# Patient Record
Sex: Male | Born: 1965 | Race: White | Hispanic: No | Marital: Married | Smoking: Never smoker
Health system: Southern US, Community
[De-identification: ages and names within clinical notes are randomized; demographics above are authoritative.]

## PROBLEM LIST (undated history)

## (undated) DIAGNOSIS — I2510 Atherosclerotic heart disease of native coronary artery without angina pectoris: Secondary | ICD-10-CM

## (undated) DIAGNOSIS — J383 Other diseases of vocal cords: Secondary | ICD-10-CM

## (undated) DIAGNOSIS — M189 Osteoarthritis of first carpometacarpal joint, unspecified: Secondary | ICD-10-CM

## (undated) DIAGNOSIS — M51369 Other intervertebral disc degeneration, lumbar region without mention of lumbar back pain or lower extremity pain: Secondary | ICD-10-CM

## (undated) DIAGNOSIS — I10 Essential (primary) hypertension: Secondary | ICD-10-CM

## (undated) DIAGNOSIS — E119 Type 2 diabetes mellitus without complications: Secondary | ICD-10-CM

## (undated) DIAGNOSIS — I071 Rheumatic tricuspid insufficiency: Secondary | ICD-10-CM

## (undated) DIAGNOSIS — I77810 Thoracic aortic ectasia: Secondary | ICD-10-CM

## (undated) DIAGNOSIS — H6992 Unspecified Eustachian tube disorder, left ear: Secondary | ICD-10-CM

## (undated) DIAGNOSIS — H6523 Chronic serous otitis media, bilateral: Secondary | ICD-10-CM

## (undated) DIAGNOSIS — E1165 Type 2 diabetes mellitus with hyperglycemia: Secondary | ICD-10-CM

## (undated) DIAGNOSIS — M353 Polymyalgia rheumatica: Secondary | ICD-10-CM

## (undated) DIAGNOSIS — F419 Anxiety disorder, unspecified: Secondary | ICD-10-CM

## (undated) DIAGNOSIS — L290 Pruritus ani: Secondary | ICD-10-CM

## (undated) DIAGNOSIS — J309 Allergic rhinitis, unspecified: Secondary | ICD-10-CM

## (undated) DIAGNOSIS — R490 Dysphonia: Secondary | ICD-10-CM

## (undated) DIAGNOSIS — T7840XA Allergy, unspecified, initial encounter: Secondary | ICD-10-CM

## (undated) DIAGNOSIS — E785 Hyperlipidemia, unspecified: Secondary | ICD-10-CM

## (undated) DIAGNOSIS — M5136 Other intervertebral disc degeneration, lumbar region: Secondary | ICD-10-CM

## (undated) DIAGNOSIS — M109 Gout, unspecified: Secondary | ICD-10-CM

## (undated) DIAGNOSIS — Z8659 Personal history of other mental and behavioral disorders: Secondary | ICD-10-CM

## (undated) DIAGNOSIS — I517 Cardiomegaly: Secondary | ICD-10-CM

## (undated) DIAGNOSIS — Z87442 Personal history of urinary calculi: Secondary | ICD-10-CM

## (undated) DIAGNOSIS — I209 Angina pectoris, unspecified: Secondary | ICD-10-CM

## (undated) DIAGNOSIS — R7989 Other specified abnormal findings of blood chemistry: Secondary | ICD-10-CM

## (undated) HISTORY — DX: Other intervertebral disc degeneration, lumbar region: M51.36

## (undated) HISTORY — DX: Other intervertebral disc degeneration, lumbar region without mention of lumbar back pain or lower extremity pain: M51.369

## (undated) HISTORY — DX: Gout, unspecified: M10.9

## (undated) HISTORY — DX: Allergy, unspecified, initial encounter: T78.40XA

## (undated) HISTORY — PX: OTHER SURGICAL HISTORY: SHX169

## (undated) HISTORY — DX: Allergic rhinitis, unspecified: J30.9

## (undated) HISTORY — DX: Type 2 diabetes mellitus with hyperglycemia: E11.65

## (undated) HISTORY — DX: Personal history of other mental and behavioral disorders: Z86.59

## (undated) HISTORY — DX: Pruritus ani: L29.0

---

## 1983-03-25 HISTORY — PX: KNEE SURGERY: SHX244

## 1992-03-24 DIAGNOSIS — I10 Essential (primary) hypertension: Secondary | ICD-10-CM

## 1992-03-24 HISTORY — DX: Essential (primary) hypertension: I10

## 1995-03-25 HISTORY — PX: BACK SURGERY: SHX140

## 1997-03-24 HISTORY — PX: ANKLE SURGERY: SHX546

## 1998-03-24 DIAGNOSIS — Z8659 Personal history of other mental and behavioral disorders: Secondary | ICD-10-CM

## 1998-03-24 HISTORY — DX: Personal history of other mental and behavioral disorders: Z86.59

## 1998-04-07 ENCOUNTER — Emergency Department (HOSPITAL_COMMUNITY): Admission: EM | Admit: 1998-04-07 | Discharge: 1998-04-07 | Payer: Self-pay | Admitting: Emergency Medicine

## 1998-04-10 ENCOUNTER — Ambulatory Visit (HOSPITAL_COMMUNITY): Admission: RE | Admit: 1998-04-10 | Discharge: 1998-04-10 | Payer: Self-pay | Admitting: Orthopedic Surgery

## 1998-04-10 ENCOUNTER — Encounter: Payer: Self-pay | Admitting: Orthopedic Surgery

## 2000-03-24 HISTORY — PX: SHOULDER SURGERY: SHX246

## 2002-05-17 ENCOUNTER — Emergency Department (HOSPITAL_COMMUNITY): Admission: EM | Admit: 2002-05-17 | Discharge: 2002-05-17 | Payer: Self-pay

## 2005-01-09 ENCOUNTER — Encounter: Admission: RE | Admit: 2005-01-09 | Discharge: 2005-01-09 | Payer: Self-pay | Admitting: Specialist

## 2005-07-23 ENCOUNTER — Encounter: Payer: Self-pay | Admitting: Emergency Medicine

## 2005-07-23 ENCOUNTER — Encounter: Payer: Self-pay | Admitting: Orthopedic Surgery

## 2010-03-24 DIAGNOSIS — E1165 Type 2 diabetes mellitus with hyperglycemia: Secondary | ICD-10-CM

## 2010-03-24 DIAGNOSIS — IMO0002 Reserved for concepts with insufficient information to code with codable children: Secondary | ICD-10-CM

## 2010-03-24 HISTORY — DX: Reserved for concepts with insufficient information to code with codable children: IMO0002

## 2010-03-24 HISTORY — DX: Type 2 diabetes mellitus with hyperglycemia: E11.65

## 2011-12-23 DIAGNOSIS — L290 Pruritus ani: Secondary | ICD-10-CM

## 2011-12-23 HISTORY — DX: Pruritus ani: L29.0

## 2012-12-12 ENCOUNTER — Emergency Department (INDEPENDENT_AMBULATORY_CARE_PROVIDER_SITE_OTHER)
Admission: EM | Admit: 2012-12-12 | Discharge: 2012-12-12 | Disposition: A | Discharge status: Home / Self Care | Payer: BC Managed Care – PPO | Source: Home / Self Care | Attending: Family Medicine | Admitting: Family Medicine

## 2012-12-12 ENCOUNTER — Encounter (HOSPITAL_COMMUNITY): Payer: Self-pay | Admitting: Emergency Medicine

## 2012-12-12 DIAGNOSIS — J019 Acute sinusitis, unspecified: Secondary | ICD-10-CM

## 2012-12-12 DIAGNOSIS — Z760 Encounter for issue of repeat prescription: Secondary | ICD-10-CM

## 2012-12-12 HISTORY — DX: Essential (primary) hypertension: I10

## 2012-12-12 HISTORY — DX: Type 2 diabetes mellitus without complications: E11.9

## 2012-12-12 LAB — POCT I-STAT, CHEM 8
BUN: 13 mg/dL (ref 6–23)
Calcium, Ion: 1.24 mmol/L — ABNORMAL HIGH (ref 1.12–1.23)
Chloride: 105 mEq/L (ref 96–112)
Creatinine, Ser: 0.9 mg/dL (ref 0.50–1.35)
Glucose, Bld: 106 mg/dL — ABNORMAL HIGH (ref 70–99)
HCT: 51 % (ref 39.0–52.0)
Hemoglobin: 17.3 g/dL — ABNORMAL HIGH (ref 13.0–17.0)
Potassium: 4.1 mEq/L (ref 3.5–5.1)
Sodium: 141 mEq/L (ref 135–145)
TCO2: 24 mmol/L (ref 0–100)

## 2012-12-12 MED ORDER — FLUTICASONE PROPIONATE 50 MCG/ACT NA SUSP: 2.0000 | Freq: Every day | NASAL | Status: DC

## 2012-12-12 MED ORDER — LISINOPRIL 20 MG PO TABS: 20.0000 mg | ORAL_TABLET | Freq: Two times a day (BID) | ORAL | Status: DC

## 2012-12-12 MED ORDER — AMOXICILLIN 500 MG PO CAPS: 500.0000 mg | ORAL_CAPSULE | Freq: Three times a day (TID) | ORAL | Status: DC

## 2012-12-12 MED ORDER — CETIRIZINE-PSEUDOEPHEDRINE ER 5-120 MG PO TB12: 1.0000 | ORAL_TABLET | Freq: Two times a day (BID) | ORAL | Status: DC

## 2012-12-12 MED ORDER — GUAIFENESIN-CODEINE 100-10 MG/5ML PO SOLN: 5.0000 mL | Freq: Three times a day (TID) | ORAL | Status: DC | PRN

## 2012-12-12 NOTE — ED Notes (Signed)
Pt c/o persistent dry cough with mild congestion and pressure in both ears. This morning pt states he could barely hear due to ringing in his ears. Pt has been taking OTC allergy meds and nyquil with mild relief. Pt is alert and oriented.

## 2012-12-12 NOTE — ED Provider Notes (Signed)
CSN: [id number]     Arrival date & time 12/12/12  0945 History   First MD Initiated Contact with Patient 12/12/12 1055     Chief Complaint  Patient presents with  . URI   (Consider location/radiation/quality/duration/timing/severity/associated sxs/prior Treatment) HPI Comments: 47 year old male with history of diabetes and hypertension. Here complaining of nasal congestion, dry nonproductive cough and bilateral ear pain for 4 days. Symptoms getting worse and pain in his left ear has been worse and also left side sinus pressure since last night. Has been taking over-the-counter allergy medications with minimal improvement. Denies chest pain, shortness of breath. The patient also would like a refill on his blood pressure medication and these have a new patient appointment with a primary care office in November.   Past Medical History  Diagnosis Date  . Diabetes mellitus without complication   . Hypertension    Past Surgical History  Procedure Laterality Date  . Shoulder surgery    . Back surgery    . Ankle surgery    . Knee surgery     History reviewed. No pertinent family history. History  Substance Use Topics  . Smoking status: Never Smoker   . Smokeless tobacco: Not on file  . Alcohol Use: Yes     Comment: occasonally    Review of Systems  Constitutional: Negative for fever, chills, diaphoresis, appetite change and fatigue.  HENT: Positive for ear pain, congestion and rhinorrhea. Negative for sore throat, trouble swallowing and ear discharge.   Eyes: Negative for discharge.  Respiratory: Positive for cough. Negative for shortness of breath and wheezing.   Cardiovascular: Negative for chest pain, palpitations and leg swelling.  Gastrointestinal: Negative for nausea, vomiting and abdominal pain.  Endocrine: Negative for polydipsia, polyphagia and polyuria.  Skin: Negative for rash.  Neurological: Positive for headaches. Negative for dizziness.  All other systems reviewed and  are negative.    Allergies  Review of patient's allergies indicates no known allergies.  Home Medications   Current Outpatient Rx  Name  Route  Sig  Dispense  Refill  . lisinopril (PRINIVIL,ZESTRIL) 20 MG tablet   Oral   Take 20 mg by mouth 2 (two) times daily.          BP 150/96  Pulse 69  Temp(Src) 97.9 F (36.6 C) (Oral)  Resp 20  SpO2 98% Physical Exam  Nursing note and vitals reviewed. Constitutional: He is oriented to person, place, and time. He appears well-developed and well-nourished. No distress.  HENT:  Head: Normocephalic and atraumatic.  Mouth/Throat: No oropharyngeal exudate.  Nasal Congestion with erythema and swelling of nasal turbinates, clear rhinorrhea. Mild pharyngeal erythema no exudates. No uvula deviation. No trismus. TM's with increased vascular markings and some dullness bilaterally no swelling or bulging  Eyes: Conjunctivae and EOM are normal. Pupils are equal, round, and reactive to light. Right eye exhibits no discharge. Left eye exhibits no discharge. No scleral icterus.  Neck: Neck supple. No JVD present. No thyromegaly present.  Cardiovascular: Normal rate, regular rhythm and normal heart sounds.  Exam reveals no gallop and no friction rub.   No murmur heard. No LEE  Pulmonary/Chest: Effort normal and breath sounds normal.  Neurological: He is alert and oriented to person, place, and time.  Skin: No rash noted. He is not diaphoretic.    ED Course  Procedures (including critical care time) Labs Review Labs Reviewed  POCT I-STAT, CHEM 8 - Abnormal; Notable for the following:    Glucose, Bld 106 (*)  Calcium, Ion 1.24 (*)    Hemoglobin 17.3 (*)    All other components within normal limits   Imaging Review No results found.  MDM   1. Acute rhinosinusitis   2. Medication refill    Prescribed cetirizine/pseudoephedrine, fluticasone, guaifenesin/codeine. Hold prescription for amoxicillin to start in 48 hours if not improving or  worsening symptoms. Refilled lisinopril.  Supportive care and red flags should prompt his return to medical attention discussed with patient and provided in writing.     Sharin Grave, MD 12/15/12 1317

## 2013-01-24 ENCOUNTER — Ambulatory Visit (INDEPENDENT_AMBULATORY_CARE_PROVIDER_SITE_OTHER): Payer: BC Managed Care – PPO | Admitting: Family Medicine

## 2013-01-24 ENCOUNTER — Encounter: Payer: Self-pay | Admitting: Family Medicine

## 2013-01-24 VITALS — BP 148/98 | HR 72 | Temp 98.7°F | Ht 69.5 in | Wt 279.5 lb

## 2013-01-24 DIAGNOSIS — M109 Gout, unspecified: Secondary | ICD-10-CM

## 2013-01-24 DIAGNOSIS — E119 Type 2 diabetes mellitus without complications: Secondary | ICD-10-CM

## 2013-01-24 DIAGNOSIS — I152 Hypertension secondary to endocrine disorders: Secondary | ICD-10-CM | POA: Insufficient documentation

## 2013-01-24 DIAGNOSIS — R195 Other fecal abnormalities: Secondary | ICD-10-CM | POA: Insufficient documentation

## 2013-01-24 DIAGNOSIS — E1140 Type 2 diabetes mellitus with diabetic neuropathy, unspecified: Secondary | ICD-10-CM | POA: Insufficient documentation

## 2013-01-24 DIAGNOSIS — M5137 Other intervertebral disc degeneration, lumbosacral region: Secondary | ICD-10-CM

## 2013-01-24 DIAGNOSIS — E1159 Type 2 diabetes mellitus with other circulatory complications: Secondary | ICD-10-CM | POA: Insufficient documentation

## 2013-01-24 DIAGNOSIS — L290 Pruritus ani: Secondary | ICD-10-CM

## 2013-01-24 DIAGNOSIS — J309 Allergic rhinitis, unspecified: Secondary | ICD-10-CM | POA: Insufficient documentation

## 2013-01-24 DIAGNOSIS — M5136 Other intervertebral disc degeneration, lumbar region: Secondary | ICD-10-CM | POA: Insufficient documentation

## 2013-01-24 DIAGNOSIS — I10 Essential (primary) hypertension: Secondary | ICD-10-CM

## 2013-01-24 MED ORDER — LISINOPRIL 20 MG PO TABS: 20.0000 mg | ORAL_TABLET | Freq: Two times a day (BID) | ORAL | Status: DC

## 2013-01-24 NOTE — Assessment & Plan Note (Signed)
Chronic - elevated today. Advised to monitor at home and notify me if persistently elevated.

## 2013-01-24 NOTE — Addendum Note (Signed)
Addended by: Eustaquio Boyden on: 01/24/2013 10:59 AM   Modules accepted: Orders

## 2013-01-24 NOTE — Assessment & Plan Note (Signed)
Uses flonase prn.  

## 2013-01-24 NOTE — Progress Notes (Addendum)
Subjective:    Patient ID: Joel Bennett, male    DOB: 16-Jul-1965, 47 y.o.   MRN: [id number]  HPI CC: new pt to establish  Pleasant 47 yo presents to establish care.  Prior saw Dr. Boneta Lucks in [place].  Recently moved from there.  Will send off form for records.  HTN - compliant with lisinopril 20mg  bid.  Checks at home.  No HA, vision changes, CP/tightness, SOB, leg swelling.  DM - dx 2 yrs ago.  Diet controlled.  Last A1c was 6ish.  Checks sugars intermittently.  Last eye exam >1 yr ago (11/2011).  Foot exam today.  Intermittent itch perinally for the last year.  Unsure if rash present with this.  Has tried preparation H which initially helped but no longer.  Becoming more constant.  Worse at night time - keeps him up.  No h/o hemorrhoids.  No bleeding with stooling.  Tends to wake him up nightly 1 hour after he falls asleep  Dizzy episode 1 wk ago.  Lasted a few hours.  Sleep improved sxs.  Described as lightheaded.  Sugar was 109.    Gout - on indocin prn.  No recent gout flare.  Preventative: last CPE 2012 Flu shot  Tetanus - ~2009  Medications and allergies reviewed and updated in chart.  Past histories reviewed and updated if relevant as below. There are no active problems to display for this patient.  Past Medical History  Diagnosis Date  . Diabetes mellitus without complication 2012  . Hypertension 1994  . DDD (degenerative disc disease), lumbar   . History of depression 2000    treated with meds  . Allergic rhinitis    Past Surgical History  Procedure Laterality Date  . Shoulder surgery Left 2002    x 2  . Back surgery  1997    Lumbar  . Ankle surgery Left 1999  . Knee surgery Right 1985   History  Substance Use Topics  . Smoking status: Former Smoker    Types: Cigarettes    Quit date: 03/24/1978  . Smokeless tobacco: Current User    Types: Snuff  . Alcohol Use: Yes     Comment: occasional   Family History  Problem Relation Age of Onset  . Cancer  Maternal Aunt     lung (smoker)  . Cancer Maternal Aunt     rectal  . Cancer Mother     lung (smoker)  . Cancer Paternal Grandmother     lung (smoker)  . Cancer Other     paternal great uncle unsure type  . Alcohol abuse Paternal Grandfather   . Alcohol abuse Maternal Grandfather   . Alcohol abuse Paternal Uncle     and drugs  . CAD Paternal Grandmother     MI  . CAD Paternal Uncle     MI  . CAD Paternal Aunt 20    MI  . Stroke Maternal Grandmother   . Stroke Cousin   . Diabetes Father   . Diabetes Cousin    No Known Allergies Current Outpatient Prescriptions on File Prior to Visit  Medication Sig Dispense Refill  . lisinopril (PRINIVIL,ZESTRIL) 20 MG tablet Take 1 tablet (20 mg total) by mouth 2 (two) times daily.  30 tablet  1  . fluticasone (FLONASE) 50 MCG/ACT nasal spray Place 2 sprays into the nose daily.  16 g  1   No current facility-administered medications on file prior to visit.     Review of Systems  Constitutional: Negative  for fever, chills, activity change, appetite change, fatigue and unexpected weight change.  HENT: Negative for hearing loss.   Eyes: Negative for visual disturbance.  Respiratory: Negative for cough, chest tightness, shortness of breath and wheezing.   Cardiovascular: Negative for chest pain, palpitations and leg swelling.  Gastrointestinal: Negative for nausea, vomiting, abdominal pain, diarrhea, constipation, blood in stool and abdominal distention.  Genitourinary: Negative for hematuria and difficulty urinating.  Musculoskeletal: Negative for arthralgias, myalgias and neck pain.  Skin: Negative for rash.  Neurological: Positive for dizziness. Negative for seizures, syncope and headaches.  Hematological: Negative for adenopathy. Does not bruise/bleed easily.  Psychiatric/Behavioral: Negative for dysphoric mood. The patient is not nervous/anxious.        Objective:   Physical Exam  Nursing note and vitals reviewed. Constitutional:  He is oriented to person, place, and time. He appears well-developed and well-nourished. No distress.  HENT:  Head: Normocephalic and atraumatic.  Right Ear: External ear normal.  Left Ear: External ear normal.  Nose: Nose normal.  Mouth/Throat: Oropharynx is clear and moist. No oropharyngeal exudate.  Eyes: Conjunctivae and EOM are normal. Pupils are equal, round, and reactive to light. No scleral icterus.  Neck: Normal range of motion. Neck supple. No thyromegaly present.  Cardiovascular: Normal rate, regular rhythm, normal heart sounds and intact distal pulses.   No murmur heard. Pulses:      Radial pulses are 2+ on the right side, and 2+ on the left side.  Pulmonary/Chest: Effort normal and breath sounds normal. No respiratory distress. He has no wheezes. He has no rales.  Abdominal: Soft. Bowel sounds are normal.  Genitourinary: Rectal exam shows no external hemorrhoid and no fissure.  Raw and irritated perianal skin. No obvious erythema or rash. Mildly tender anterior anal ridge  Musculoskeletal: Normal range of motion. He exhibits no edema.  Diabetic foot exam: Normal inspection No skin breakdown calluses R>L Normal DP/PT pulses Normal sensation to light touch and monofilament Nails normal  Lymphadenopathy:    He has no cervical adenopathy.  Neurological: He is alert and oriented to person, place, and time.  CN grossly intact, station and gait intact  Skin: Skin is warm and dry. No rash noted.  Psychiatric: He has a normal mood and affect. His behavior is normal. Judgment and thought content normal.       Assessment & Plan:

## 2013-01-24 NOTE — Assessment & Plan Note (Addendum)
Check A1c at next blood work. From report sounds diet controlled diabetes.

## 2013-01-24 NOTE — Assessment & Plan Note (Signed)
?  pinworm infection vs irritant dermatitis. Sent home with pinworm collection kit. Pt will return in the next week.

## 2013-01-24 NOTE — Assessment & Plan Note (Signed)
Continue indocin prn.  No recent gout flares.

## 2013-01-24 NOTE — Patient Instructions (Signed)
Monitor blood pressure at home and let me know if consistently elevated. I will await records from [place]. Return in next few weeks fasting at your conveniene and afterwards for physical. Return pinworm kit - if negative, I will prescribe topical medicine for itch.

## 2013-01-24 NOTE — Assessment & Plan Note (Signed)
Continue flexeril prn

## 2013-01-25 NOTE — Addendum Note (Signed)
Addended by: Alvina Chou on: 01/25/2013 02:16 PM   Modules accepted: Orders

## 2013-01-26 LAB — PINWORM PREP: RESULT - PINWORM: NONE SEEN

## 2013-01-27 ENCOUNTER — Other Ambulatory Visit: Payer: Self-pay | Admitting: Family Medicine

## 2013-01-27 ENCOUNTER — Other Ambulatory Visit: Payer: Self-pay

## 2013-01-27 MED ORDER — DESONIDE 0.05 % EX CREA: TOPICAL_CREAM | Freq: Every day | CUTANEOUS | Status: DC

## 2013-02-25 ENCOUNTER — Encounter: Payer: Self-pay | Admitting: Family Medicine

## 2013-02-25 ENCOUNTER — Ambulatory Visit (INDEPENDENT_AMBULATORY_CARE_PROVIDER_SITE_OTHER): Payer: BC Managed Care – PPO | Admitting: Family Medicine

## 2013-02-25 VITALS — BP 136/90 | HR 65 | Temp 98.2°F | Ht 70.5 in | Wt 277.0 lb

## 2013-02-25 DIAGNOSIS — R198 Other specified symptoms and signs involving the digestive system and abdomen: Secondary | ICD-10-CM

## 2013-02-25 DIAGNOSIS — E119 Type 2 diabetes mellitus without complications: Secondary | ICD-10-CM

## 2013-02-25 DIAGNOSIS — R194 Change in bowel habit: Secondary | ICD-10-CM

## 2013-02-25 DIAGNOSIS — M109 Gout, unspecified: Secondary | ICD-10-CM

## 2013-02-25 DIAGNOSIS — L290 Pruritus ani: Secondary | ICD-10-CM

## 2013-02-25 DIAGNOSIS — D751 Secondary polycythemia: Secondary | ICD-10-CM

## 2013-02-25 DIAGNOSIS — I10 Essential (primary) hypertension: Secondary | ICD-10-CM

## 2013-02-25 DIAGNOSIS — D45 Polycythemia vera: Secondary | ICD-10-CM

## 2013-02-25 DIAGNOSIS — Z0000 Encounter for general adult medical examination without abnormal findings: Secondary | ICD-10-CM | POA: Insufficient documentation

## 2013-02-25 DIAGNOSIS — Z0001 Encounter for general adult medical examination with abnormal findings: Secondary | ICD-10-CM | POA: Insufficient documentation

## 2013-02-25 LAB — LIPID PANEL
Cholesterol: 150 mg/dL (ref 0–200)
HDL: 30.4 mg/dL — ABNORMAL LOW (ref >39.00)
LDL Cholesterol: 100 mg/dL — ABNORMAL HIGH (ref 0–99)
Total CHOL/HDL Ratio: 5
Triglycerides: 99 mg/dL (ref 0.0–149.0)
VLDL: 19.8 mg/dL (ref 0.0–40.0)

## 2013-02-25 LAB — CBC WITH DIFFERENTIAL/PLATELET
Basophils Absolute: 0 10*3/uL (ref 0.0–0.1)
Basophils Relative: 0.5 % (ref 0.0–3.0)
Eosinophils Absolute: 0.2 10*3/uL (ref 0.0–0.7)
Eosinophils Relative: 2.7 % (ref 0.0–5.0)
HCT: 45.5 % (ref 39.0–52.0)
Hemoglobin: 15.1 g/dL (ref 13.0–17.0)
Lymphocytes Relative: 32.3 % (ref 12.0–46.0)
Lymphs Abs: 2.1 10*3/uL (ref 0.7–4.0)
MCHC: 33.2 g/dL (ref 30.0–36.0)
MCV: 95.6 fl (ref 78.0–100.0)
Monocytes Absolute: 0.6 10*3/uL (ref 0.1–1.0)
Monocytes Relative: 9 % (ref 3.0–12.0)
Neutro Abs: 3.6 10*3/uL (ref 1.4–7.7)
Neutrophils Relative %: 55.5 % (ref 43.0–77.0)
Platelets: 187 10*3/uL (ref 150.0–400.0)
RBC: 4.76 Mil/uL (ref 4.22–5.81)
RDW: 13.6 % (ref 11.5–14.6)
WBC: 6.5 10*3/uL (ref 4.5–10.5)

## 2013-02-25 LAB — COMPREHENSIVE METABOLIC PANEL
ALT: 26 U/L (ref 0–53)
AST: 24 U/L (ref 0–37)
Albumin: 3.9 g/dL (ref 3.5–5.2)
Alkaline Phosphatase: 63 U/L (ref 39–117)
BUN: 11 mg/dL (ref 6–23)
CO2: 28 mEq/L (ref 19–32)
Calcium: 9 mg/dL (ref 8.4–10.5)
Chloride: 104 mEq/L (ref 96–112)
Creatinine, Ser: 1 mg/dL (ref 0.4–1.5)
GFR: 84.4 mL/min (ref >60.00)
Glucose, Bld: 126 mg/dL — ABNORMAL HIGH (ref 70–99)
Potassium: 4 mEq/L (ref 3.5–5.1)
Sodium: 138 mEq/L (ref 135–145)
Total Bilirubin: 0.8 mg/dL (ref 0.3–1.2)
Total Protein: 7 g/dL (ref 6.0–8.3)

## 2013-02-25 LAB — MICROALBUMIN / CREATININE URINE RATIO
Creatinine,U: 75.3 mg/dL
Microalb Creat Ratio: 1.9 mg/g (ref 0.0–30.0)
Microalb, Ur: 1.4 mg/dL (ref 0.0–1.9)

## 2013-02-25 LAB — HEMOGLOBIN A1C: Hgb A1c MFr Bld: 7.4 % — ABNORMAL HIGH (ref 4.6–6.5)

## 2013-02-25 LAB — TSH: TSH: 1.01 u[IU]/mL (ref 0.35–5.50)

## 2013-02-25 LAB — URIC ACID: Uric Acid, Serum: 6.5 mg/dL (ref 4.0–7.8)

## 2013-02-25 NOTE — Assessment & Plan Note (Signed)
Check A1c today.

## 2013-02-25 NOTE — Assessment & Plan Note (Signed)
Has resolved along with anal irritation - however now endorsing increased stool leaking after Bms - will treat with increased fiber in diet (1 glass metamucil or benefiber) - if this does not help, recommended GI referral for further evaluation.

## 2013-02-25 NOTE — Progress Notes (Signed)
Subjective:    Patient ID: Joel Bennett, male    DOB: Jun 04, 1965, 47 y.o.   MRN: [id number]  HPI CC: CPE  Some anal leakage noted afte BMs - wonders if rash and itching started from this.  Desonide cream did help.  Off all creams for now.  Denies diarrhea, constipation.  Stools 3-4 times daily.  Endorses formed stools.  DM - diet controlled.  HTN - compliant with lisinopril. Gout - no recent flares Wt Readings from Last 3 Encounters:  02/25/13 277 lb (125.646 kg)  01/24/13 279 lb 8 oz (126.78 kg)   Body mass index is 39.17 kg/(m^2).  Lives with wife Grown children Occ: Librarian, academic Edu: HS Activity: no regular exercise Diet: good water, fruits/vegetables daily, red meat regularly.  Preventative:  Flu shot - declines Tetanus - ~2009 Pneumovax - declines Seat belt use discussed - does not use. Sunscreen use discussed - does not use.  Medications and allergies reviewed and updated in chart.  Past histories reviewed and updated if relevant as below. Patient Active Problem List   Diagnosis Date Noted  . Healthcare maintenance 02/25/2013  . Anal itching 01/24/2013  . Gout   . Allergic rhinitis   . DDD (degenerative disc disease), lumbar   . Hypertension   . Diabetes mellitus without complication    Past Medical History  Diagnosis Date  . Diabetes mellitus without complication 2012  . Hypertension 1994  . DDD (degenerative disc disease), lumbar   . History of depression 2000    treated with meds  . Allergic rhinitis   . Gout    Past Surgical History  Procedure Laterality Date  . Shoulder surgery Left 2002    x 2  . Back surgery  1997    Lumbar  . Ankle surgery Left 1999  . Knee surgery Right 1985   History  Substance Use Topics  . Smoking status: Former Smoker    Types: Cigarettes    Quit date: 03/24/1978  . Smokeless tobacco: Current User    Types: Snuff  . Alcohol Use: Yes     Comment: occasional   Family History  Problem Relation Age of  Onset  . Cancer Maternal Aunt     lung (smoker)  . Cancer Maternal Aunt     rectal  . Cancer Mother     lung (smoker)  . Cancer Paternal Grandmother     lung (smoker)  . Cancer Other     paternal great uncle unsure type  . Alcohol abuse Paternal Grandfather   . Alcohol abuse Maternal Grandfather   . Alcohol abuse Paternal Uncle     and drugs  . CAD Paternal Grandmother     MI  . CAD Paternal Uncle     MI  . CAD Paternal Aunt 19    MI  . Stroke Maternal Grandmother   . Stroke Cousin   . Diabetes Father   . Diabetes Cousin    No Known Allergies Current Outpatient Prescriptions on File Prior to Visit  Medication Sig Dispense Refill  . desonide (DESOWEN) 0.05 % cream Apply topically daily. Apply to AA for no more than 1 week  30 g  0  . fluticasone (FLONASE) 50 MCG/ACT nasal spray Place 2 sprays into the nose daily.  16 g  1  . indomethacin (INDOCIN) 50 MG capsule Take 50 mg by mouth 3 (three) times daily with meals.      Marland Kitchen lisinopril (PRINIVIL,ZESTRIL) 20 MG tablet Take 1 tablet (20  mg total) by mouth 2 (two) times daily.  180 tablet  3  . cyclobenzaprine (FLEXERIL) 10 MG tablet Take 10 mg by mouth 3 (three) times daily as needed for muscle spasms.       No current facility-administered medications on file prior to visit.     Review of Systems  Constitutional: Positive for unexpected weight change (increase). Negative for fever, chills, activity change, appetite change and fatigue.  HENT: Negative for hearing loss.   Eyes: Negative for visual disturbance.  Respiratory: Negative for cough, chest tightness, shortness of breath and wheezing.   Cardiovascular: Negative for chest pain, palpitations and leg swelling.  Gastrointestinal: Negative for nausea, vomiting, abdominal pain, diarrhea, constipation, blood in stool and abdominal distention.  Genitourinary: Negative for hematuria and difficulty urinating.  Musculoskeletal: Negative for arthralgias, myalgias and neck pain.    Skin: Negative for rash.  Neurological: Positive for headaches (?allergy related). Negative for dizziness, seizures and syncope.  Hematological: Negative for adenopathy. Bruises/bleeds easily.  Psychiatric/Behavioral: Negative for dysphoric mood. The patient is not nervous/anxious.        Objective:   Physical Exam  Nursing note and vitals reviewed. Constitutional: He is oriented to person, place, and time. He appears well-developed and well-nourished. No distress.  HENT:  Head: Normocephalic and atraumatic.  Right Ear: External ear normal.  Left Ear: External ear normal.  Nose: Nose normal.  Mouth/Throat: Oropharynx is clear and moist. No oropharyngeal exudate.  Eyes: Conjunctivae and EOM are normal. Pupils are equal, round, and reactive to light. No scleral icterus.  Neck: Normal range of motion. Neck supple. No thyromegaly present.  Cardiovascular: Normal rate, regular rhythm, normal heart sounds and intact distal pulses.   No murmur heard. Pulses:      Radial pulses are 2+ on the right side, and 2+ on the left side.  Pulmonary/Chest: Effort normal and breath sounds normal. No respiratory distress. He has no wheezes. He has no rales.  Abdominal: Soft. Bowel sounds are normal. He exhibits no distension and no mass. There is no tenderness. There is no rebound and no guarding.  Genitourinary: Rectum normal and prostate normal. Rectal exam shows no external hemorrhoid, no internal hemorrhoid, no fissure, no mass, no tenderness and anal tone normal. Guaiac negative stool. Prostate is not enlarged (20gm) and not tender.  Musculoskeletal: Normal range of motion. He exhibits no edema.  Lymphadenopathy:    He has no cervical adenopathy.  Neurological: He is alert and oriented to person, place, and time.  CN grossly intact, station and gait intact  Skin: Skin is warm and dry. No rash noted.  Psychiatric: He has a normal mood and affect. His behavior is normal. Judgment and thought content  normal.       Assessment & Plan:

## 2013-02-25 NOTE — Assessment & Plan Note (Signed)
Stable, continue lisinopril 

## 2013-02-25 NOTE — Patient Instructions (Signed)
For stool issue - let's try soluble fiber supplement daily (benefiber or metamucil 1 glass daily) for 1-2 weeks.  If no noted improvement, let me know for referral to GI. Blood work today. I think you are doing well. Return as needed or in 1 year for next physical

## 2013-02-25 NOTE — Assessment & Plan Note (Signed)
Check uric acid today 

## 2013-02-25 NOTE — Assessment & Plan Note (Signed)
Preventative protocols reviewed and updated unless pt declined. Discussed healthy diet and lifestyle.  Declines all immunizations. 

## 2013-02-27 LAB — HEMOCCULT GUIAC POC 1CARD (OFFICE): Fecal Occult Blood, POC: NEGATIVE

## 2013-03-14 ENCOUNTER — Ambulatory Visit (INDEPENDENT_AMBULATORY_CARE_PROVIDER_SITE_OTHER): Payer: BC Managed Care – PPO | Admitting: Family Medicine

## 2013-03-14 ENCOUNTER — Encounter: Payer: Self-pay | Admitting: Family Medicine

## 2013-03-14 VITALS — BP 130/76 | HR 83 | Temp 98.3°F | Wt 280.5 lb

## 2013-03-14 DIAGNOSIS — J0100 Acute maxillary sinusitis, unspecified: Secondary | ICD-10-CM | POA: Insufficient documentation

## 2013-03-14 DIAGNOSIS — J019 Acute sinusitis, unspecified: Secondary | ICD-10-CM | POA: Insufficient documentation

## 2013-03-14 MED ORDER — AMOXICILLIN-POT CLAVULANATE 875-125 MG PO TABS: 1.0000 | ORAL_TABLET | Freq: Two times a day (BID) | ORAL | Status: DC

## 2013-03-14 NOTE — Progress Notes (Signed)
Pre-visit discussion using our clinic review tool. No additional management support is needed unless otherwise documented below in the visit note.  Sx started about 2 weeks ago.  Not getting better.  No ear pain but stuffy.  Rhinorrhea.  Stuffy nose.  No ST.  Not much post nasal gtt.  Cough is minimal, not much sputum, from clearing throat.  No fevers.  No aches, sweats, vomiting. No diarrhea.  No rash.   Meds, vitals, and allergies reviewed.   ROS: See HPI.  Otherwise, noncontributory.  GEN: nad, alert and oriented HEENT: mucous membranes moist, tm w/o erythema, nasal exam w/o erythema, clear discharge noted,  OP with cobblestoning, L max sinus ttp NECK: supple w/o LA CV: rrr.   PULM: ctab, no inc wob EXT: no edema SKIN: no acute rash

## 2013-03-14 NOTE — Assessment & Plan Note (Signed)
With exam and hx, would treat.  D/w pt. He agrees. Nontoxic. See instructions.

## 2013-03-14 NOTE — Patient Instructions (Signed)
Drink plenty of fluids, take tylenol as needed, and gargle with warm salt water for your throat.  Start the antibiotics today.  This should gradually improve.  Take care.  Let us know if you have other concerns.    

## 2013-03-16 ENCOUNTER — Ambulatory Visit: Payer: BC Managed Care – PPO | Admitting: Family Medicine

## 2013-03-28 ENCOUNTER — Encounter: Payer: Self-pay | Admitting: Family Medicine

## 2013-12-14 ENCOUNTER — Encounter: Payer: Self-pay | Admitting: Family Medicine

## 2013-12-14 ENCOUNTER — Ambulatory Visit (INDEPENDENT_AMBULATORY_CARE_PROVIDER_SITE_OTHER): Payer: BC Managed Care – PPO | Admitting: Family Medicine

## 2013-12-14 VITALS — BP 124/84 | HR 66 | Temp 98.2°F | Wt 270.5 lb

## 2013-12-14 DIAGNOSIS — J069 Acute upper respiratory infection, unspecified: Secondary | ICD-10-CM

## 2013-12-14 MED ORDER — AMOXICILLIN-POT CLAVULANATE 875-125 MG PO TABS: 1.0000 | ORAL_TABLET | Freq: Two times a day (BID) | ORAL | Status: DC

## 2013-12-14 NOTE — Progress Notes (Signed)
Pre visit review using our clinic review tool, if applicable. No additional management support is needed unless otherwise documented below in the visit note.  Sx started >1 week ago, started with nasal congestion.  "I fell awful."  Chills. No vomiting, no diarrhea. No known fevers.  L sided ear "tickles, like something is in there."  Still stuffy, worse in Am.  Some rhinorrhea.  Cough, with some chest congestion. Still fatigued.  Some day are better than others, some parts of the days are better than others.   Meds, vitals, and allergies reviewed.   ROS: See HPI.  Otherwise, noncontributory.  GEN: nad, alert and oriented HEENT: mucous membranes moist, tm w/o erythema, nasal exam w/o erythema, clear discharge noted,  OP with cobblestoning, sinuses not ttp NECK: supple w/o LA CV: rrr.   PULM: ctab, no inc wob EXT: no edema SKIN: no acute rash

## 2013-12-14 NOTE — Patient Instructions (Signed)
Use nasal saline a few times a day along with claritin D.  If not better in a few days, then start the antibiotics.   Take care. Glad to see you.

## 2013-12-15 DIAGNOSIS — J069 Acute upper respiratory infection, unspecified: Secondary | ICD-10-CM | POA: Insufficient documentation

## 2013-12-15 NOTE — Assessment & Plan Note (Signed)
Likely viral, d/w pt.  Use nasal saline a few times a day along with claritin D.  If not better in a few days, then start the augmentin. F/u prn.  He agrees.

## 2014-01-02 ENCOUNTER — Encounter: Payer: Self-pay | Admitting: Family Medicine

## 2014-01-03 MED ORDER — INDOMETHACIN 50 MG PO CAPS: 50.0000 mg | ORAL_CAPSULE | Freq: Three times a day (TID) | ORAL | Status: DC

## 2014-01-06 ENCOUNTER — Other Ambulatory Visit: Payer: Self-pay

## 2014-01-17 ENCOUNTER — Other Ambulatory Visit: Payer: Self-pay | Admitting: Family Medicine

## 2014-01-23 ENCOUNTER — Other Ambulatory Visit: Payer: Self-pay | Admitting: Family Medicine

## 2014-01-23 ENCOUNTER — Encounter: Payer: Self-pay | Admitting: Family Medicine

## 2014-01-23 ENCOUNTER — Ambulatory Visit (INDEPENDENT_AMBULATORY_CARE_PROVIDER_SITE_OTHER)
Admission: RE | Admit: 2014-01-23 | Discharge: 2014-01-23 | Disposition: A | Discharge status: Home / Self Care | Payer: BC Managed Care – PPO | Source: Ambulatory Visit | Attending: Family Medicine | Admitting: Family Medicine

## 2014-01-23 ENCOUNTER — Ambulatory Visit (INDEPENDENT_AMBULATORY_CARE_PROVIDER_SITE_OTHER): Payer: BC Managed Care – PPO | Admitting: Family Medicine

## 2014-01-23 VITALS — BP 130/80 | HR 72 | Temp 98.1°F | Wt 273.5 lb

## 2014-01-23 DIAGNOSIS — M189 Osteoarthritis of first carpometacarpal joint, unspecified: Secondary | ICD-10-CM | POA: Insufficient documentation

## 2014-01-23 DIAGNOSIS — G8929 Other chronic pain: Secondary | ICD-10-CM

## 2014-01-23 DIAGNOSIS — H9202 Otalgia, left ear: Secondary | ICD-10-CM

## 2014-01-23 DIAGNOSIS — H938X2 Other specified disorders of left ear: Secondary | ICD-10-CM | POA: Insufficient documentation

## 2014-01-23 DIAGNOSIS — M1812 Unilateral primary osteoarthritis of first carpometacarpal joint, left hand: Secondary | ICD-10-CM

## 2014-01-23 DIAGNOSIS — M25532 Pain in left wrist: Secondary | ICD-10-CM

## 2014-01-23 MED ORDER — HYDROCORTISONE-ACETIC ACID 1-2 % OT SOLN: 3.0000 [drp] | Freq: Three times a day (TID) | OTIC | Status: DC

## 2014-01-23 NOTE — Assessment & Plan Note (Addendum)
Not consistent with TMJ, TMs clear bilaterally, exam WNL, no LAD. Unclear etiology of ear pain. ?external canal irritation - sent in vosol hc drops. Discussed if not better, may send him to ENT for eval. Pt agrees with plan.

## 2014-01-23 NOTE — Patient Instructions (Addendum)
For L wrist - I think you have [place] arthritis. Xray today. If arthritis present we may refer you to hand surgeon to discuss further treatment options. In the interim, try vitamin D and glucosamine supplement for joint health. For L ear - irrigation performed today on right side to compare ear drums. Looking about the same. Try ear drops sent to pharmacy today for possible irritation of canal. Return in 1 month fasting for blood work and physical. Good to see you today, call us with questions.

## 2014-01-23 NOTE — Assessment & Plan Note (Addendum)
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 Will refer to hand surgery for further evaluation.

## 2014-01-23 NOTE — Progress Notes (Signed)
Pre visit review using our clinic review tool, if applicable. No additional management support is needed unless otherwise documented below in the visit note. 

## 2014-01-23 NOTE — Progress Notes (Signed)
BP 130/80 mmHg  Pulse 72  Temp(Src) 98.1 F (36.7 C) (Oral)  Wt 273 lb 8 oz (124.059 kg)   CC: hand and ear pain  Subjective:    Patient ID: Joel Bennett, male    DOB: 1966-02-07, 48 y.o.   MRN: [id number]  HPI: Joel Bennett is a 48 y.o. male presenting on 01/23/2014 for Hand Pain and Otalgia   Patient with gout and hypertension. Pt dips.  L hand pain ongoing for months - points to 1st [facility] joint. Denies inciting trauma/injury to hand. So far has tried indocin and ibuprofen without much improvement. Stays swollen. No current redness or warmth. Feels different from gout. No other joint pains. H/o L thumb injury 4 yrs ago - caught between steel rods, did not seek care for this.  Left ear pain - ongoing for last year. Intermittently improves some but never fully resolved. Denies hearing changes, tinnitus, URI sxs. Mild congestion last few days. Points inside ear as site of pain, with radiation to L side of face. Pain described as dull ache with "something crawling inside". No fevers/chills recently.  Lab Results  Component Value Date   HGBA1C 7.4* 02/25/2013  due for DM check.   Relevant past medical, surgical, family and social history reviewed and updated as indicated.  Allergies and medications reviewed and updated. Current Outpatient Prescriptions on File Prior to Visit  Medication Sig  . indomethacin (INDOCIN) 50 MG capsule Take 1 capsule (50 mg total) by mouth 3 (three) times daily with meals.  Marland Kitchen lisinopril (PRINIVIL,ZESTRIL) 20 MG tablet Take one tablet twice a day **WILL NEED PHYSICAL FOR FURTHER REFILLS**   No current facility-administered medications on file prior to visit.    Review of Systems Per HPI unless specifically indicated above    Objective:    BP 130/80 mmHg  Pulse 72  Temp(Src) 98.1 F (36.7 C) (Oral)  Wt 273 lb 8 oz (124.059 kg)  Physical Exam  Constitutional: He appears well-developed and well-nourished. No distress.  HENT:  Head:  Normocephalic and atraumatic.  Right Ear: Hearing and external ear normal.  Left Ear: Hearing, external ear and ear canal normal.  Nose: Nose normal. No mucosal edema or rhinorrhea.  Mouth/Throat: Uvula is midline, oropharynx is clear and moist and mucous membranes are normal. No oropharyngeal exudate, posterior oropharyngeal edema, posterior oropharyngeal erythema or tonsillar abscesses.  R ear with some wax in canal - unable to visualize TM so irrigation performed. L TM retracted but pearly grey  Eyes: Conjunctivae and EOM are normal. Pupils are equal, round, and reactive to light. No scleral icterus.  Neck: Normal range of motion. Neck supple.  Musculoskeletal: He exhibits no edema.  No pain at anatomical snuff box. Tender to palpation L 1st [facility] joint. FROM at thumb and wrists No active synovitis.  Lymphadenopathy:       Head (right side): No submental, no submandibular, no tonsillar, no preauricular and no posterior auricular adenopathy present.       Head (left side): No submental, no submandibular, no tonsillar, no preauricular and no posterior auricular adenopathy present.    He has no cervical adenopathy.  Skin: Skin is warm and dry. No rash noted.  Nursing note and vitals reviewed.      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 Will refer to hand surgery for further evaluation.    Relevant Orders      DG Wrist Complete Left (Completed)   Chronic left ear pain    Not consistent with TMJ, TMs clear bilaterally, exam WNL, no LAD. Unclear etiology of ear pain. ?external canal irritation - sent in vosol hc drops. Discussed if not better, may send him to ENT for eval. Pt agrees with plan.        Follow up plan: Return in about 1 month (around 02/22/2014), or as needed, for  annual exam, prior fasting for blood work.

## 2014-02-18 ENCOUNTER — Other Ambulatory Visit: Payer: Self-pay | Admitting: Family Medicine

## 2014-02-18 DIAGNOSIS — M109 Gout, unspecified: Secondary | ICD-10-CM

## 2014-02-18 DIAGNOSIS — I10 Essential (primary) hypertension: Secondary | ICD-10-CM

## 2014-02-18 DIAGNOSIS — E119 Type 2 diabetes mellitus without complications: Secondary | ICD-10-CM

## 2014-02-20 ENCOUNTER — Other Ambulatory Visit (INDEPENDENT_AMBULATORY_CARE_PROVIDER_SITE_OTHER): Payer: BC Managed Care – PPO

## 2014-02-20 DIAGNOSIS — I10 Essential (primary) hypertension: Secondary | ICD-10-CM

## 2014-02-20 DIAGNOSIS — E119 Type 2 diabetes mellitus without complications: Secondary | ICD-10-CM

## 2014-02-20 DIAGNOSIS — M109 Gout, unspecified: Secondary | ICD-10-CM

## 2014-02-20 LAB — MICROALBUMIN / CREATININE URINE RATIO
Creatinine,U: 113.1 mg/dL
Microalb Creat Ratio: 0.7 mg/g (ref 0.0–30.0)
Microalb, Ur: 0.8 mg/dL (ref 0.0–1.9)

## 2014-02-20 LAB — URIC ACID: Uric Acid, Serum: 7.7 mg/dL (ref 4.0–7.8)

## 2014-02-20 LAB — BASIC METABOLIC PANEL
BUN: 16 mg/dL (ref 6–23)
CO2: 27 mEq/L (ref 19–32)
Calcium: 8.9 mg/dL (ref 8.4–10.5)
Chloride: 101 mEq/L (ref 96–112)
Creatinine, Ser: 0.9 mg/dL (ref 0.4–1.5)
GFR: 97.43 mL/min (ref >60.00)
Glucose, Bld: 123 mg/dL — ABNORMAL HIGH (ref 70–99)
Potassium: 4.2 mEq/L (ref 3.5–5.1)
Sodium: 136 mEq/L (ref 135–145)

## 2014-02-20 LAB — LIPID PANEL
Cholesterol: 167 mg/dL (ref 0–200)
HDL: 31.1 mg/dL — ABNORMAL LOW (ref >39.00)
LDL Cholesterol: 98 mg/dL (ref 0–99)
NonHDL: 135.9
Total CHOL/HDL Ratio: 5
Triglycerides: 189 mg/dL — ABNORMAL HIGH (ref 0.0–149.0)
VLDL: 37.8 mg/dL (ref 0.0–40.0)

## 2014-02-20 LAB — HEMOGLOBIN A1C: Hgb A1c MFr Bld: 7.7 % — ABNORMAL HIGH (ref 4.6–6.5)

## 2014-02-22 ENCOUNTER — Other Ambulatory Visit: Payer: Self-pay | Admitting: *Deleted

## 2014-02-22 ENCOUNTER — Encounter: Payer: Self-pay | Admitting: Family Medicine

## 2014-02-22 MED ORDER — GLUCOSE BLOOD VI STRP: ORAL_STRIP | Status: DC

## 2014-02-22 NOTE — Telephone Encounter (Signed)
Ok to do. Thanks Check sugars 1-2 times daily prn. ICD10 = E11.65 Lab Results  Component Value Date   HGBA1C 7.7* 02/20/2014

## 2014-02-23 ENCOUNTER — Telehealth: Payer: Self-pay | Admitting: *Deleted

## 2014-02-27 ENCOUNTER — Ambulatory Visit (INDEPENDENT_AMBULATORY_CARE_PROVIDER_SITE_OTHER): Payer: BC Managed Care – PPO | Admitting: Family Medicine

## 2014-02-27 ENCOUNTER — Encounter: Payer: Self-pay | Admitting: Family Medicine

## 2014-02-27 VITALS — BP 136/86 | HR 84 | Temp 98.0°F | Ht 70.5 in | Wt 277.5 lb

## 2014-02-27 DIAGNOSIS — M109 Gout, unspecified: Secondary | ICD-10-CM

## 2014-02-27 DIAGNOSIS — Z0000 Encounter for general adult medical examination without abnormal findings: Secondary | ICD-10-CM

## 2014-02-27 DIAGNOSIS — I10 Essential (primary) hypertension: Secondary | ICD-10-CM

## 2014-02-27 DIAGNOSIS — IMO0001 Reserved for inherently not codable concepts without codable children: Secondary | ICD-10-CM

## 2014-02-27 DIAGNOSIS — E1165 Type 2 diabetes mellitus with hyperglycemia: Secondary | ICD-10-CM

## 2014-02-27 DIAGNOSIS — Z1211 Encounter for screening for malignant neoplasm of colon: Secondary | ICD-10-CM

## 2014-02-27 MED ORDER — METFORMIN HCL 500 MG PO TABS
500.0000 mg | ORAL_TABLET | Freq: Two times a day (BID) | ORAL | Status: DC
Start: 2014-02-27 — End: 2015-03-12

## 2014-02-27 NOTE — Assessment & Plan Note (Signed)
A1c deteriorated - will start metformin. Recheck 6 mo for f/u.

## 2014-02-27 NOTE — Patient Instructions (Addendum)
Let's start metformin 500mg  once daily for 5 days then increase to 500mg  twice dialy. This has been sent to pharmacy. Better sugar should help triglyceride levels. We will refer you for colonsocopy - expect a call in next few weeks. Return as needed or in 6 months for follow up, prior for labs.

## 2014-02-27 NOTE — Assessment & Plan Note (Signed)
Preventative protocols reviewed and updated unless pt declined. Discussed healthy diet and lifestyle.  Declines immunizations

## 2014-02-27 NOTE — Assessment & Plan Note (Signed)
Very infrequent flares - no need for daily gout lowering medication.

## 2014-02-27 NOTE — Assessment & Plan Note (Signed)
Stable on lisinopril. 

## 2014-02-27 NOTE — Progress Notes (Signed)
Pre visit review using our clinic review tool, if applicable. No additional management support is needed unless otherwise documented below in the visit note. 
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
## 2014-02-28 ENCOUNTER — Telehealth: Payer: Self-pay | Admitting: Family Medicine

## 2014-02-28 NOTE — Telephone Encounter (Signed)
emmi emailed °

## 2014-03-01 NOTE — Telephone Encounter (Signed)
error 

## 2014-03-14 ENCOUNTER — Encounter: Payer: Self-pay | Admitting: Gastroenterology

## 2014-04-10 ENCOUNTER — Encounter: Payer: Self-pay | Admitting: Family Medicine

## 2014-04-10 ENCOUNTER — Ambulatory Visit (INDEPENDENT_AMBULATORY_CARE_PROVIDER_SITE_OTHER): Payer: BLUE CROSS/BLUE SHIELD | Admitting: Family Medicine

## 2014-04-10 VITALS — BP 124/82 | HR 62 | Temp 98.0°F | Wt 276.8 lb

## 2014-04-10 DIAGNOSIS — J069 Acute upper respiratory infection, unspecified: Secondary | ICD-10-CM | POA: Insufficient documentation

## 2014-04-10 NOTE — Patient Instructions (Addendum)
You have a sinus infection, likely viral as early on. Take medicine as prescribed: ibuprofen short course 400-600mg  three times daily with food Push fluids and plenty of rest. Nasal saline irrigation or neti pot to help drain sinuses. May use plain mucinex with plenty of fluid to help mobilize mucous. Please let us know if fever >101.5, trouble opening/closing mouth, difficulty swallowing, or worsening instead of improving as expected.

## 2014-04-10 NOTE — Progress Notes (Signed)
Pre visit review using our clinic review tool, if applicable. No additional management support is needed unless otherwise documented below in the visit note. 

## 2014-04-10 NOTE — Assessment & Plan Note (Signed)
Anticipate viral URI given short duration and endorsed improvement noted. Supportive care as per instructions. Discussed warning signs to consider abx course. Update if not improving as expected.

## 2014-04-10 NOTE — Progress Notes (Signed)
   BP 124/82 mmHg  Pulse 62  Temp(Src) 98 F (36.7 C) (Oral)  Wt 276 lb 12 oz (125.533 kg)  SpO2 97%   CC: ?sinusitis  Subjective:    Patient ID: Joel Bennett, male    DOB: May 28, 1965, 49 y.o.   MRN: [id number]  HPI: [name] is a 49 y.o. male presenting on 04/10/2014 for Sinusitis   5d h/o stuffy nose, headache described as facial pressure pain, trouble breathing, ST, muffled hearing with clogged ears,   No fevers/chills, tooth pain, dyspnea or wheezing. No cough. No PNDrainage.  H/o allergic rhinitis. Ex smoker Has tried nyquil, OTC remedies to no avail. + sick contact is wife  Relevant past medical, surgical, family and social history reviewed and updated as indicated. Interim medical history since our last visit reviewed. Allergies and medications reviewed and updated. Current Outpatient Prescriptions on File Prior to Visit  Medication Sig  . glucose blood (FREESTYLE LITE) test strip Use as instructed to check sugar 1-2 times daily as needed. Dx: E11.65  . indomethacin (INDOCIN) 50 MG capsule Take 1 capsule (50 mg total) by mouth 3 (three) times daily with meals.  Marland Kitchen lisinopril (PRINIVIL,ZESTRIL) 20 MG tablet Take one tablet twice a day **WILL NEED PHYSICAL FOR FURTHER REFILLS**  . metFORMIN (GLUCOPHAGE) 500 MG tablet Take 1 tablet (500 mg total) by mouth 2 (two) times daily with a meal.   No current facility-administered medications on file prior to visit.    Review of Systems Per HPI unless specifically indicated above     Objective:    BP 124/82 mmHg  Pulse 62  Temp(Src) 98 F (36.7 C) (Oral)  Wt 276 lb 12 oz (125.533 kg)  SpO2 97%  Wt Readings from Last 3 Encounters:  04/10/14 276 lb 12 oz (125.533 kg)  02/27/14 277 lb 8 oz (125.873 kg)  01/23/14 273 lb 8 oz (124.059 kg)    Physical Exam  Constitutional: He appears well-developed and well-nourished. No distress.  HENT:  Head: Normocephalic and atraumatic.  Right Ear: Hearing, tympanic membrane,  external ear and ear canal normal.  Left Ear: Hearing, tympanic membrane, external ear and ear canal normal.  Nose: Mucosal edema (nasal mucosal congestion) present. No rhinorrhea. Right sinus exhibits no maxillary sinus tenderness and no frontal sinus tenderness. Left sinus exhibits no maxillary sinus tenderness and no frontal sinus tenderness.  Mouth/Throat: Uvula is midline and mucous membranes are normal. Posterior oropharyngeal erythema (raw) present. No oropharyngeal exudate, posterior oropharyngeal edema or tonsillar abscesses.  Eyes: Conjunctivae and EOM are normal. Pupils are equal, round, and reactive to light. No scleral icterus.  Neck: Normal range of motion. Neck supple.  Cardiovascular: Normal rate, regular rhythm, normal heart sounds and intact distal pulses.   No murmur heard. Pulmonary/Chest: Effort normal and breath sounds normal. No respiratory distress. He has no wheezes. He has no rales.  Lymphadenopathy:    He has no cervical adenopathy.  Skin: Skin is warm and dry. No rash noted.  Nursing note and vitals reviewed.      Assessment & Plan:   Problem List Items Addressed This Visit    Viral URI - Primary    Anticipate viral URI given short duration and endorsed improvement noted. Supportive care as per instructions. Discussed warning signs to consider abx course. Update if not improving as expected.          Follow up plan: Return if symptoms worsen or fail to improve.

## 2014-04-14 ENCOUNTER — Other Ambulatory Visit: Payer: Self-pay | Admitting: Family Medicine

## 2014-05-15 ENCOUNTER — Ambulatory Visit (AMBULATORY_SURGERY_CENTER): Payer: Self-pay | Admitting: *Deleted

## 2014-05-15 VITALS — Ht 69.0 in | Wt 272.8 lb

## 2014-05-15 DIAGNOSIS — Z1211 Encounter for screening for malignant neoplasm of colon: Secondary | ICD-10-CM

## 2014-05-15 MED ORDER — MOVIPREP 100 G PO SOLR: ORAL | Status: DC

## 2014-05-15 NOTE — Progress Notes (Signed)
No egg or soy allergy  No anesthesia or intubation problems per pt  No diet medications taken   

## 2014-05-23 HISTORY — PX: COLONOSCOPY: SHX174

## 2014-05-24 ENCOUNTER — Ambulatory Visit (INDEPENDENT_AMBULATORY_CARE_PROVIDER_SITE_OTHER): Payer: BLUE CROSS/BLUE SHIELD | Admitting: Internal Medicine

## 2014-05-24 ENCOUNTER — Encounter: Payer: Self-pay | Admitting: Internal Medicine

## 2014-05-24 VITALS — BP 126/84 | HR 78 | Temp 98.7°F | Wt 275.0 lb

## 2014-05-24 DIAGNOSIS — J329 Chronic sinusitis, unspecified: Secondary | ICD-10-CM

## 2014-05-24 DIAGNOSIS — B9789 Other viral agents as the cause of diseases classified elsewhere: Secondary | ICD-10-CM

## 2014-05-24 DIAGNOSIS — B349 Viral infection, unspecified: Secondary | ICD-10-CM

## 2014-05-24 MED ORDER — PREDNISONE 10 MG PO TABS: ORAL_TABLET | ORAL | Status: DC

## 2014-05-24 NOTE — Progress Notes (Signed)
Pre visit review using our clinic review tool, if applicable. No additional management support is needed unless otherwise documented below in the visit note. 

## 2014-05-24 NOTE — Progress Notes (Signed)
HPI  Pt presents to the clinic today with c/o headache, facial pain and pressure, left ear pain and sore throat. He reports his symptoms started 2 months ago. He did see Dr. Danise Mina 04/10/14 for the same- diagnosed with a viral URI and advised to use supportive measures at that time. He has not had any relief since that time. He is blowing clear mucous out of his nose. He denies fevers, but has had chills and body aches. He has tried Claritin, Zyrtec, Mucinex and Nyquil with minimal relief. He does have a history of allergies but denies breathing problems. He has had sick contacts. He does not smoke.  Review of Systems    Past Medical History  Diagnosis Date  . Diabetes type 2, uncontrolled 2012  . Hypertension 1994  . DDD (degenerative disc disease), lumbar   . History of depression 2000    treated with meds  . Allergic rhinitis   . Gout   . Stool incontinence 12/2011    neg giardia, neg O&P, neg iFOB from prior PCP  . Allergy   . Diabetes mellitus without complication     Family History  Problem Relation Age of Onset  . Cancer Maternal Aunt     lung (smoker)  . Rectal cancer Maternal Aunt   . Cancer Maternal Aunt     rectal  . Cancer Mother     lung (smoker)  . Cancer Paternal Grandmother     lung (smoker)  . CAD Paternal Grandmother     MI  . Cancer Other     paternal great uncle unsure type  . Alcohol abuse Paternal Grandfather   . Alcohol abuse Maternal Grandfather   . Alcohol abuse Paternal Uncle     and drugs  . CAD Paternal Uncle     MI  . CAD Paternal Aunt 44    MI  . Stroke Maternal Grandmother   . Stroke Cousin   . Diabetes Father   . Diabetes Cousin   . Colon polyps Neg Hx   . Esophageal cancer Neg Hx   . Stomach cancer Neg Hx     History   Social History  . Marital Status: Married    Spouse Name: N/A  . Number of Children: N/A  . Years of Education: N/A   Occupational History  . Not on file.   Social History Main Topics  . Smoking  status: Former Smoker    Types: Cigarettes    Quit date: 03/24/1978  . Smokeless tobacco: Current User    Types: Snuff  . Alcohol Use: Yes     Comment: occasional  . Drug Use: No  . Sexual Activity: Yes   Other Topics Concern  . Not on file   Social History Narrative   Lives with wife   Grown children   Occ: Database administrator   Edu: HS   Activity: no regular exercise   Diet: good water, fruits/vegetables daily, red meat regularly    No Known Allergies   Constitutional: Positive headache, fatigue and fever. Denies fever or abrupt weight changes.  HEENT:  Positive  facial pain, nasal congestion, runny nose, ear pain, and sore throat. Denies eye redness,  ringing in the ears, wax buildup, runny nose or bloody nose. Respiratory: Positive cough. Denies difficulty breathing or shortness of breath.  Cardiovascular: Denies chest pain, chest tightness, palpitations or swelling in the hands or feet.   No other specific complaints in a complete review of systems (except as listed in  HPI above).  Objective:  BP 126/84 mmHg  Pulse 78  Temp(Src) 98.7 F (37.1 C) (Oral)  Wt 275 lb (124.739 kg)  SpO2 98%   General: Appears his stated age, obese, in NAD. HEENT: Head: normal shape and size,frontal sinus tenderness noted; Eyes: sclera white, no icterus, conjunctiva pink; Ears: Tm's gray and intact, normal light reflex; Nose: mucosa boggy and moist, septum midline; Throat/Mouth: + PND. Teeth present, mucosa pink and moist, no exudate noted, no lesions or ulcerations noted.  Neck: No adenopathy noted. Cardiovascular: Normal rate and rhythm. S1,S2 noted.  No murmur, rubs or gallops noted.  Pulmonary/Chest: Normal effort and positive vesicular breath sounds. No respiratory distress. No wheezes, rales or ronchi noted.      Assessment & Plan:   Viral sinusitis  Can use a Neti Pot which can be purchased from your local drug store. Flonase 2 sprays each nostril for 3 days and then as  needed. Continue zyrtec eRx for Pred taper, if no improvement consider Augmentin BID x 10 days   RTC as needed or if symptoms persist.

## 2014-05-24 NOTE — Patient Instructions (Signed)

## 2014-05-29 ENCOUNTER — Encounter: Payer: Self-pay | Admitting: Gastroenterology

## 2014-05-29 ENCOUNTER — Other Ambulatory Visit: Payer: Self-pay | Admitting: Internal Medicine

## 2014-05-29 ENCOUNTER — Encounter: Payer: Self-pay | Admitting: Internal Medicine

## 2014-05-29 ENCOUNTER — Ambulatory Visit: Payer: BLUE CROSS/BLUE SHIELD | Admitting: Gastroenterology

## 2014-05-29 VITALS — BP 139/79 | HR 66 | Temp 96.8°F | Resp 16 | Ht 69.0 in | Wt 275.0 lb

## 2014-05-29 DIAGNOSIS — Z1211 Encounter for screening for malignant neoplasm of colon: Secondary | ICD-10-CM

## 2014-05-29 DIAGNOSIS — D125 Benign neoplasm of sigmoid colon: Secondary | ICD-10-CM

## 2014-05-29 DIAGNOSIS — D120 Benign neoplasm of cecum: Secondary | ICD-10-CM

## 2014-05-29 DIAGNOSIS — D128 Benign neoplasm of rectum: Secondary | ICD-10-CM

## 2014-05-29 DIAGNOSIS — D129 Benign neoplasm of anus and anal canal: Secondary | ICD-10-CM

## 2014-05-29 LAB — GLUCOSE, CAPILLARY
Glucose-Capillary: 97 mg/dL (ref 70–99)
Glucose-Capillary: 98 mg/dL (ref 70–99)

## 2014-05-29 MED ORDER — AMOXICILLIN-POT CLAVULANATE 875-125 MG PO TABS: 1.0000 | ORAL_TABLET | Freq: Two times a day (BID) | ORAL | Status: DC

## 2014-05-29 MED ORDER — SODIUM CHLORIDE 0.9 % IV SOLN: 500.0000 mL | INTRAVENOUS | Status: DC

## 2014-05-29 NOTE — Progress Notes (Signed)
Patient denies any allergies to eggs or soy. 

## 2014-05-29 NOTE — Progress Notes (Signed)
Called to room to assist during endoscopic procedure.  Patient ID and intended procedure confirmed with present staff. Received instructions for my participation in the procedure from the performing physician.  

## 2014-05-29 NOTE — Patient Instructions (Signed)
Discharge instructions given. Handout on polyps. Resume previous medications. YOU HAD AN ENDOSCOPIC PROCEDURE TODAY AT THE [facility]:   Refer to the procedure report that was given to you for any specific questions about what was found during the examination.  If the procedure report does not answer your questions, please call your gastroenterologist to clarify.  If you requested that your care partner not be given the details of your procedure findings, then the procedure report has been included in a sealed envelope for you to review at your convenience later.  YOU SHOULD EXPECT: Some feelings of bloating in the abdomen. Passage of more gas than usual.  Walking can help get rid of the air that was put into your GI tract during the procedure and reduce the bloating. If you had a lower endoscopy (such as a colonoscopy or flexible sigmoidoscopy) you may notice spotting of blood in your stool or on the toilet paper. If you underwent a bowel prep for your procedure, you may not have a normal bowel movement for a few days.  Please Note:  You might notice some irritation and congestion in your nose or some drainage.  This is from the oxygen used during your procedure.  There is no need for concern and it should clear up in a day or so.  SYMPTOMS TO REPORT IMMEDIATELY:   Following lower endoscopy (colonoscopy or flexible sigmoidoscopy):  Excessive amounts of blood in the stool  Significant tenderness or worsening of abdominal pains  Swelling of the abdomen that is new, acute  Fever of 100F or higher   For urgent or emergent issues, a gastroenterologist can be reached at any hour by calling [phone].   DIET: Your first meal following the procedure should be a small meal and then it is ok to progress to your normal diet. Heavy or fried foods are harder to digest and may make you feel nauseous or bloated.  Likewise, meals heavy in dairy and vegetables can increase bloating.  Drink  plenty of fluids but you should avoid alcoholic beverages for 24 hours.  ACTIVITY:  You should plan to take it easy for the rest of today and you should NOT DRIVE or use heavy machinery until tomorrow (because of the sedation medicines used during the test).    FOLLOW UP: Our staff will call the number listed on your records the next business day following your procedure to check on you and address any questions or concerns that you may have regarding the information given to you following your procedure. If we do not reach you, we will leave a message.  However, if you are feeling well and you are not experiencing any problems, there is no need to return our call.  We will assume that you have returned to your regular daily activities without incident.  If any biopsies were taken you will be contacted by phone or by letter within the next 1-3 weeks.  Please call us at [phone] if you have not heard about the biopsies in 3 weeks.    SIGNATURES/CONFIDENTIALITY: You and/or your care partner have signed paperwork which will be entered into your electronic medical record.  These signatures attest to the fact that that the information above on your After Visit Summary has been reviewed and is understood.  Full responsibility of the confidentiality of this discharge information lies with you and/or your care-partner. 

## 2014-05-29 NOTE — Progress Notes (Signed)
Report to PACU, RN, vss, BBS= Clear.  

## 2014-05-29 NOTE — Op Note (Signed)
[place]  [address] [employer] [place], [id number]   COLONOSCOPY PROCEDURE REPORT  PATIENT: Joel Bennett, Joel Bennett  MR#: [id number] BIRTHDATE: May 10, 1965 , 48  yrs. old GENDER: male ENDOSCOPIST: Milus Banister, MD REFERRED [place]:OVZCHY Gutierrez, M.D. PROCEDURE DATE:  05/29/2014 PROCEDURE:   Colonoscopy, screening and Colonoscopy with snare polypectomy First Screening Colonoscopy - Avg.  risk and is 50 yrs.  old or older Yes.  Prior Negative Screening - Now for repeat screening. N/A  History of Adenoma - Now for follow-up colonoscopy & has been > or = to 3 yrs.  N/A  Polyps Removed Today? Yes. ASA CLASS:   Class III INDICATIONS:Screening for colonic neoplasia and Average Risk. MEDICATIONS: Monitored anesthesia care, Propofol 200 mg IV, and lidocaine 200mg  IV  DESCRIPTION OF PROCEDURE:   After the risks benefits and alternatives of the procedure were thoroughly explained, informed consent was obtained.  The digital rectal exam revealed no abnormalities of the rectum.   The LB [id number]  endoscope was introduced through the anus and advanced to the cecum, which was identified by both the appendix and ileocecal valve. No adverse events experienced.   The quality of the prep was good.  The instrument was then slowly withdrawn as the colon was fully examined.  COLON FINDINGS: Four sessile polyps ranging between 3-55mm in size were found in the rectum, sigmoid colon, and at the cecum. Polypectomies were performed with a cold snare.  The resection was complete, the polyp tissue was completely retrieved and sent to histology.   The examination was otherwise normal.  Retroflexed views revealed no abnormalities. The time to cecum = 2.0 Withdrawal time = 11.2   The scope was withdrawn and the procedure completed. COMPLICATIONS: There were no immediate complications.  ENDOSCOPIC IMPRESSION: 1.   Four sessile polyps ranging between 3-45mm in size were found in the rectum,  sigmoid colon, and at the cecum; polypectomies were performed with a cold snare 2.   The examination was otherwise normal  RECOMMENDATIONS: If the polyp(s) removed today are proven to be adenomatous (pre-cancerous) polyps, you will need a colonoscopy in 3-5 years. Otherwise you should continue to follow colorectal cancer screening guidelines for "routine risk" patients with a colonoscopy in 10 years.  You will receive a letter within 1-2 weeks with the results of your biopsy as well as final recommendations.  Please call my office if you have not received a letter after 3 weeks.  eSigned:  Milus Banister, MD 05/29/2014 8:56 AM

## 2014-05-30 ENCOUNTER — Telehealth: Payer: Self-pay

## 2014-05-30 NOTE — Telephone Encounter (Signed)
  Follow up Call-  Call back number 05/29/2014  Post procedure Call Back phone  # [phone]  Permission to leave phone message Yes     Patient questions:  Do you have a fever, pain , or abdominal swelling? No. Pain Score  0 *  Have you tolerated food without any problems? Yes.    Have you been able to return to your normal activities? Yes.    Do you have any questions about your discharge instructions: Diet   No. Medications  No. Follow up visit  No.  Do you have questions or concerns about your Care? No.  Actions: * If pain score is 4 or above: No action needed, pain <4.

## 2014-06-06 ENCOUNTER — Encounter: Payer: Self-pay | Admitting: Gastroenterology

## 2014-09-18 ENCOUNTER — Other Ambulatory Visit: Payer: Self-pay

## 2014-11-04 LAB — HM DIABETES EYE EXAM

## 2014-11-11 ENCOUNTER — Encounter: Payer: Self-pay | Admitting: Family Medicine

## 2014-12-25 ENCOUNTER — Encounter: Payer: Self-pay | Admitting: Family Medicine

## 2014-12-26 ENCOUNTER — Ambulatory Visit (INDEPENDENT_AMBULATORY_CARE_PROVIDER_SITE_OTHER): Payer: BLUE CROSS/BLUE SHIELD | Admitting: Family Medicine

## 2014-12-26 ENCOUNTER — Encounter: Payer: Self-pay | Admitting: Family Medicine

## 2014-12-26 VITALS — BP 134/84 | HR 72 | Temp 98.3°F | Wt 275.8 lb

## 2014-12-26 DIAGNOSIS — R519 Headache, unspecified: Secondary | ICD-10-CM

## 2014-12-26 DIAGNOSIS — I10 Essential (primary) hypertension: Secondary | ICD-10-CM

## 2014-12-26 DIAGNOSIS — E1165 Type 2 diabetes mellitus with hyperglycemia: Secondary | ICD-10-CM | POA: Diagnosis not present

## 2014-12-26 DIAGNOSIS — R209 Unspecified disturbances of skin sensation: Secondary | ICD-10-CM | POA: Diagnosis not present

## 2014-12-26 DIAGNOSIS — R51 Headache: Secondary | ICD-10-CM

## 2014-12-26 DIAGNOSIS — R202 Paresthesia of skin: Secondary | ICD-10-CM

## 2014-12-26 DIAGNOSIS — IMO0001 Reserved for inherently not codable concepts without codable children: Secondary | ICD-10-CM

## 2014-12-26 MED ORDER — FLUTICASONE PROPIONATE 50 MCG/ACT NA SUSP: 2.0000 | Freq: Every day | NASAL | Status: DC

## 2014-12-26 MED ORDER — ASPIRIN EC 81 MG PO TBEC: 81.0000 mg | DELAYED_RELEASE_TABLET | Freq: Every day | ORAL | Status: DC

## 2014-12-26 NOTE — Telephone Encounter (Signed)
Appt scheduled for tomorrow. He said there was no way he could get here today. I advised that if he had any slurred speech, one-sided weakness, vision loss, worsening symptoms or any other changes that he needed to go to ER. He verbalized understanding.

## 2014-12-26 NOTE — Assessment & Plan Note (Signed)
Of 1d duration, associated with nonspecific diffuse bilateral head paresthesias no numbness or weakness. Outside of window for acute treatment for stroke. Discussed with patient - pressure headache more consistent with possible sinus headache - rec start flonase spray daily - sent to pharmacy. Nonfocal neurological exam. However, given new onset and stroke risk factors present (HTN, DM, fmhx stroke) I did recommend he start aspirin daily and will obtain head CT over next 1-2 days. Discussed red flags to seek emergent care. Pt agrees with plan.

## 2014-12-26 NOTE — Assessment & Plan Note (Signed)
Check A1c today. Goal <7%. Pt does not check sugars at home.

## 2014-12-26 NOTE — Assessment & Plan Note (Addendum)
Chronic, stable. Continue current regimen of lisinopril 20mg  bid.

## 2014-12-26 NOTE — Progress Notes (Signed)
BP 134/84 mmHg  Pulse 72  Temp(Src) 98.3 F (36.8 C) (Oral)  Wt 275 lb 12 oz (125.079 kg)   CC: headache with paresthesias  Subjective:    Patient ID: Joel Bennett, male    DOB: 03-Jul-1965, 49 y.o.   MRN: [id number]  HPI: Joel Bennett is a 49 y.o. male presenting on 12/26/2014 for Headache   First episode several months ago of headache associated with paresthesias. Again yesterday morning had headache described as facial pressure associated with facial paresthesias throughout entire head - neck up. Took ibuprofen which didn't help. Occasional mild orthostatic lightheadedness.   Persistent L ear sensation of something crawling inside for last 3 years. Mild pressure. No tinnitus.   No significant nasal congestion, PNdrainage, ST, ear or tooth pain, coughing. No unilateral weakness, other body paresthesias, slurred speech, confusion, vision changes, dysphagia, vertigo or presyncope, palpitations, chest pain, dyspnea.   DM - doesn't check sugars.   Relevant past medical, surgical, family and social history reviewed and updated as indicated. Interim medical history since our last visit reviewed. Allergies and medications reviewed and updated. Current Outpatient Prescriptions on File Prior to Visit  Medication Sig  . glucose blood (FREESTYLE LITE) test strip Use as instructed to check sugar 1-2 times daily as needed. Dx: E11.65  . indomethacin (INDOCIN) 50 MG capsule Take 1 capsule (50 mg total) by mouth 3 (three) times daily with meals.  Marland Kitchen lisinopril (PRINIVIL,ZESTRIL) 20 MG tablet Take 1 tablet (20 mg total) by mouth 2 (two) times daily.  . metFORMIN (GLUCOPHAGE) 500 MG tablet Take 1 tablet (500 mg total) by mouth 2 (two) times daily with a meal.   No current facility-administered medications on file prior to visit.    Review of Systems Per HPI unless specifically indicated above     Objective:    BP 134/84 mmHg  Pulse 72  Temp(Src) 98.3 F (36.8 C) (Oral)  Wt 275 lb 12  oz (125.079 kg)  Wt Readings from Last 3 Encounters:  12/26/14 275 lb 12 oz (125.079 kg)  05/29/14 275 lb (124.739 kg)  05/24/14 275 lb (124.739 kg)    Physical Exam  Constitutional: He is oriented to person, place, and time. He appears well-developed and well-nourished. No distress.  HENT:  Head: Normocephalic and atraumatic.  Right Ear: Hearing, tympanic membrane, external ear and ear canal normal.  Left Ear: Hearing, tympanic membrane, external ear and ear canal normal.  Nose: Nose normal.  Mouth/Throat: Uvula is midline, oropharynx is clear and moist and mucous membranes are normal. No oropharyngeal exudate, posterior oropharyngeal edema or posterior oropharyngeal erythema.  Eyes: Conjunctivae and EOM are normal. Pupils are equal, round, and reactive to light. Right conjunctiva is not injected. Right conjunctiva has no hemorrhage. Left conjunctiva is not injected. Left conjunctiva has no hemorrhage. No scleral icterus. Right eye exhibits normal extraocular motion and no nystagmus. Left eye exhibits no nystagmus.  Fundoscopic exam:      The right eye shows no papilledema.       The left eye shows no papilledema.  No papilledema appreciated EOMI  Neck: Normal range of motion. Neck supple. Carotid bruit is not present. No thyromegaly present.  Cardiovascular: Normal rate, regular rhythm, normal heart sounds and intact distal pulses.   No murmur heard. Pulses:      Radial pulses are 2+ on the right side, and 2+ on the left side.  Pulmonary/Chest: Effort normal and breath sounds normal. No respiratory distress. He has no wheezes. He has no  rales.  Musculoskeletal: Normal range of motion. He exhibits no edema.  Lymphadenopathy:    He has no cervical adenopathy.  Neurological: He is alert and oriented to person, place, and time. He has normal strength. No cranial nerve deficit or sensory deficit. He displays a negative Romberg sign. Coordination and gait normal.  CN 2-12 intact Gait  intact FTN intact No pronator drift  Skin: Skin is warm and dry. No rash noted.  Psychiatric: He has a normal mood and affect. His behavior is normal. Judgment and thought content normal.  Nursing note and vitals reviewed.  Results for orders placed or performed in visit on 11/11/14  HM DIABETES EYE EXAM  Result Value Ref Range   HM Diabetic Eye Exam No Retinopathy No Retinopathy      Assessment & Plan:   Problem List Items Addressed This Visit    Hypertension    Chronic, stable. Continue current regimen of lisinopril 20mg  bid.       Relevant Medications   aspirin EC 81 MG tablet   Other Relevant Orders   Comprehensive metabolic panel   LDL cholesterol, direct   Facial paresthesia    See above. Unclear etiology. Check B12, TSH today.      Relevant Orders   Comprehensive metabolic panel   TSH   CBC with Differential/Platelet   Sedimentation rate   Vitamin B12   Diabetes mellitus type 2, uncontrolled, without complications (HCC)    Check A1c today. Goal <7%. Pt does not check sugars at home.      Relevant Medications   aspirin EC 81 MG tablet   Other Relevant Orders   Hemoglobin A1c   LDL cholesterol, direct   Acute headache - Primary    Of 1d duration, associated with nonspecific diffuse bilateral head paresthesias no numbness or weakness. Outside of window for acute treatment for stroke. Discussed with patient - pressure headache more consistent with possible sinus headache - rec start flonase spray daily - sent to pharmacy. Nonfocal neurological exam. However, given new onset and stroke risk factors present (HTN, DM, fmhx stroke) I did recommend he start aspirin daily and will obtain head CT over next 1-2 days. Discussed red flags to seek emergent care. Pt agrees with plan.      Relevant Medications   aspirin EC 81 MG tablet   Other Relevant Orders   Comprehensive metabolic panel   TSH   CBC with Differential/Platelet   Sedimentation rate   CT Head Wo Contrast         Follow up plan: No Follow-up on file.

## 2014-12-26 NOTE — Assessment & Plan Note (Signed)
See above. Unclear etiology. Check B12, TSH today.

## 2014-12-26 NOTE — Telephone Encounter (Signed)
plz call to schedule appt with patient asap for eval of headaches.

## 2014-12-26 NOTE — Progress Notes (Signed)
Pre visit review using our clinic review tool, if applicable. No additional management support is needed unless otherwise documented below in the visit note. 

## 2014-12-26 NOTE — Patient Instructions (Addendum)
labwork today For pressure headache - possible sinus headache. Treat with flonase nasal steroid.  For paresthesias (tingling of face) I want to start aspirin 81mg  daily and recommend head CT scan.  Start aspirin 81mg  daily.  If any new neurological symptoms - like worsening headache, vision changes, new numbness or tingling elsewhere, weakness - go to ER.

## 2014-12-27 ENCOUNTER — Ambulatory Visit: Payer: BLUE CROSS/BLUE SHIELD | Admitting: Family Medicine

## 2014-12-27 ENCOUNTER — Ambulatory Visit (INDEPENDENT_AMBULATORY_CARE_PROVIDER_SITE_OTHER)
Admission: RE | Admit: 2014-12-27 | Discharge: 2014-12-27 | Disposition: A | Discharge status: Home / Self Care | Payer: BLUE CROSS/BLUE SHIELD | Source: Ambulatory Visit | Attending: Family Medicine | Admitting: Family Medicine

## 2014-12-27 DIAGNOSIS — R51 Headache: Secondary | ICD-10-CM

## 2014-12-27 DIAGNOSIS — R519 Headache, unspecified: Secondary | ICD-10-CM

## 2014-12-27 LAB — COMPREHENSIVE METABOLIC PANEL
ALT: 23 U/L (ref 0–53)
AST: 21 U/L (ref 0–37)
Albumin: 4.4 g/dL (ref 3.5–5.2)
Alkaline Phosphatase: 73 U/L (ref 39–117)
BUN: 9 mg/dL (ref 6–23)
CO2: 26 mEq/L (ref 19–32)
Calcium: 9.6 mg/dL (ref 8.4–10.5)
Chloride: 102 mEq/L (ref 96–112)
Creatinine, Ser: 1.02 mg/dL (ref 0.40–1.50)
GFR: 81.88 mL/min (ref >60.00)
Glucose, Bld: 115 mg/dL — ABNORMAL HIGH (ref 70–99)
Potassium: 4.3 mEq/L (ref 3.5–5.1)
Sodium: 140 mEq/L (ref 135–145)
Total Bilirubin: 0.3 mg/dL (ref 0.2–1.2)
Total Protein: 7.4 g/dL (ref 6.0–8.3)

## 2014-12-27 LAB — TSH: TSH: 1.24 u[IU]/mL (ref 0.35–4.50)

## 2014-12-27 LAB — CBC WITH DIFFERENTIAL/PLATELET
Basophils Absolute: 0 10*3/uL (ref 0.0–0.1)
Basophils Relative: 0.5 % (ref 0.0–3.0)
Eosinophils Absolute: 0.4 10*3/uL (ref 0.0–0.7)
Eosinophils Relative: 4.6 % (ref 0.0–5.0)
HCT: 46.4 % (ref 39.0–52.0)
Hemoglobin: 15.6 g/dL (ref 13.0–17.0)
Lymphocytes Relative: 32.8 % (ref 12.0–46.0)
Lymphs Abs: 2.9 10*3/uL (ref 0.7–4.0)
MCHC: 33.6 g/dL (ref 30.0–36.0)
MCV: 95.7 fl (ref 78.0–100.0)
Monocytes Absolute: 0.8 10*3/uL (ref 0.1–1.0)
Monocytes Relative: 9 % (ref 3.0–12.0)
Neutro Abs: 4.7 10*3/uL (ref 1.4–7.7)
Neutrophils Relative %: 53.1 % (ref 43.0–77.0)
Platelets: 200 10*3/uL (ref 150.0–400.0)
RBC: 4.85 Mil/uL (ref 4.22–5.81)
RDW: 13.9 % (ref 11.5–15.5)
WBC: 8.8 10*3/uL (ref 4.0–10.5)

## 2014-12-27 LAB — LDL CHOLESTEROL, DIRECT: Direct LDL: 102 mg/dL

## 2014-12-27 LAB — SEDIMENTATION RATE: Sed Rate: 5 mm/hr (ref 0–22)

## 2014-12-27 LAB — VITAMIN B12: Vitamin B-12: 338 pg/mL (ref 211–911)

## 2014-12-27 LAB — HEMOGLOBIN A1C: Hgb A1c MFr Bld: 7 % — ABNORMAL HIGH (ref 4.6–6.5)

## 2014-12-28 ENCOUNTER — Other Ambulatory Visit: Payer: Self-pay | Admitting: Family Medicine

## 2014-12-28 MED ORDER — VITAMIN B-12 500 MCG PO TABS: 500.0000 ug | ORAL_TABLET | Freq: Every day | ORAL | Status: DC

## 2015-01-22 ENCOUNTER — Other Ambulatory Visit: Payer: Self-pay | Admitting: Family Medicine

## 2015-02-06 ENCOUNTER — Encounter: Payer: Self-pay | Admitting: Family Medicine

## 2015-02-07 MED ORDER — FLUTICASONE PROPIONATE 50 MCG/ACT NA SUSP: 2.0000 | Freq: Every day | NASAL | Status: DC

## 2015-03-10 ENCOUNTER — Encounter: Payer: Self-pay | Admitting: Family Medicine

## 2015-03-12 MED ORDER — METFORMIN HCL 500 MG PO TABS: 500.0000 mg | ORAL_TABLET | Freq: Two times a day (BID) | ORAL | Status: DC

## 2015-03-24 ENCOUNTER — Other Ambulatory Visit: Payer: Self-pay | Admitting: Family Medicine

## 2015-04-08 ENCOUNTER — Other Ambulatory Visit: Payer: Self-pay | Admitting: Family Medicine

## 2015-05-11 ENCOUNTER — Encounter: Payer: Self-pay | Admitting: Family Medicine

## 2015-05-11 ENCOUNTER — Ambulatory Visit (INDEPENDENT_AMBULATORY_CARE_PROVIDER_SITE_OTHER): Payer: BLUE CROSS/BLUE SHIELD | Admitting: Family Medicine

## 2015-05-11 VITALS — BP 126/80 | HR 68 | Temp 98.5°F | Ht 73.25 in | Wt 284.2 lb

## 2015-05-11 DIAGNOSIS — J019 Acute sinusitis, unspecified: Secondary | ICD-10-CM | POA: Diagnosis not present

## 2015-05-11 DIAGNOSIS — B9689 Other specified bacterial agents as the cause of diseases classified elsewhere: Secondary | ICD-10-CM

## 2015-05-11 MED ORDER — AMOXICILLIN 500 MG PO CAPS: 1000.0000 mg | ORAL_CAPSULE | Freq: Two times a day (BID) | ORAL | Status: DC

## 2015-05-11 NOTE — Progress Notes (Signed)
   Subjective:    Patient ID: Joel Bennett, male    DOB: [birth year], 50 y.o.   MRN: [id number]  Sinusitis This is a new problem. The current episode started 1 to 4 weeks ago. The problem is unchanged. There has been no fever. The pain is moderate. Associated symptoms include congestion, coughing, headaches, sinus pressure and a sore throat. Pertinent negatives include no chills or ear pain. (Nasal congestion  headache  ) Treatments tried: multiple OTC meds. The treatment provided mild relief.   Has not bee checking blood sugars lately.  Social History /Family History/Past Medical History reviewed and updated if needed. HX pof DM  HX of sinus infections yearly He is a former smoker, minimal remotely.  Review of Systems  Constitutional: Negative for chills.  HENT: Positive for congestion, sinus pressure and sore throat. Negative for ear pain.   Respiratory: Positive for cough.   Neurological: Positive for headaches.       Objective:   Physical Exam  Constitutional: Vital signs are normal. He appears well-developed and well-nourished.  Non-toxic appearance. He does not appear ill. No distress.  HENT:  Head: Normocephalic and atraumatic.  Right Ear: Hearing, tympanic membrane, external ear and ear canal normal. No tenderness. No foreign bodies. Tympanic membrane is not retracted and not bulging.  Left Ear: Hearing, tympanic membrane, external ear and ear canal normal. No tenderness. No foreign bodies. Tympanic membrane is not retracted and not bulging.  Nose: No mucosal edema or rhinorrhea. Right sinus exhibits maxillary sinus tenderness and frontal sinus tenderness. Left sinus exhibits maxillary sinus tenderness and frontal sinus tenderness.  Mouth/Throat: Uvula is midline, oropharynx is clear and moist and mucous membranes are normal. Normal dentition. No dental caries. No oropharyngeal exudate or tonsillar abscesses.  Eyes: Conjunctivae, EOM and lids are normal. Pupils are equal,  round, and reactive to light. Lids are everted and swept, no foreign bodies found.  Neck: Trachea normal, normal range of motion and phonation normal. Neck supple. Carotid bruit is not present. No thyroid mass and no thyromegaly present.  Cardiovascular: Normal rate, regular rhythm, S1 normal, S2 normal, normal heart sounds, intact distal pulses and normal pulses.  Exam reveals no gallop.   No murmur heard. Pulmonary/Chest: Effort normal and breath sounds normal. No respiratory distress. He has no wheezes. He has no rhonchi. He has no rales.  Abdominal: Soft. Normal appearance and bowel sounds are normal. There is no hepatosplenomegaly. There is no tenderness. There is no rebound, no guarding and no CVA tenderness. No hernia.  Neurological: He is alert. He has normal reflexes.  Skin: Skin is warm, dry and intact. No rash noted.  Psychiatric: He has a normal mood and affect. His speech is normal and behavior is normal. Judgment normal.          Assessment & Plan:

## 2015-05-11 NOTE — Assessment & Plan Note (Signed)
>   2 week, on decongestant.. treat for bacterial sinus infection.  Also start mucolytic and saline.

## 2015-05-11 NOTE — Progress Notes (Signed)
Pre visit review using our clinic review tool, if applicable. No additional management support is needed unless otherwise documented below in the visit note. 

## 2015-05-11 NOTE — Patient Instructions (Signed)
Complete antibiotics. Start mucinex DM twice daily. Nasal saline spray  2-3 x a day.  Rest, fluids.  Call if not improving as expected or new fever, shortness of breath.

## 2015-07-20 ENCOUNTER — Encounter: Payer: Self-pay | Admitting: Family Medicine

## 2015-07-20 ENCOUNTER — Ambulatory Visit (INDEPENDENT_AMBULATORY_CARE_PROVIDER_SITE_OTHER): Payer: BLUE CROSS/BLUE SHIELD | Admitting: Family Medicine

## 2015-07-20 VITALS — BP 124/84 | HR 68 | Temp 98.0°F | Ht 70.5 in | Wt 275.8 lb

## 2015-07-20 DIAGNOSIS — E1165 Type 2 diabetes mellitus with hyperglycemia: Secondary | ICD-10-CM

## 2015-07-20 DIAGNOSIS — R079 Chest pain, unspecified: Secondary | ICD-10-CM | POA: Insufficient documentation

## 2015-07-20 DIAGNOSIS — Z0001 Encounter for general adult medical examination with abnormal findings: Secondary | ICD-10-CM

## 2015-07-20 DIAGNOSIS — R0789 Other chest pain: Secondary | ICD-10-CM | POA: Diagnosis not present

## 2015-07-20 DIAGNOSIS — J309 Allergic rhinitis, unspecified: Secondary | ICD-10-CM

## 2015-07-20 DIAGNOSIS — Z1211 Encounter for screening for malignant neoplasm of colon: Secondary | ICD-10-CM

## 2015-07-20 DIAGNOSIS — R6889 Other general symptoms and signs: Secondary | ICD-10-CM

## 2015-07-20 DIAGNOSIS — Z1159 Encounter for screening for other viral diseases: Secondary | ICD-10-CM

## 2015-07-20 DIAGNOSIS — H938X2 Other specified disorders of left ear: Secondary | ICD-10-CM

## 2015-07-20 DIAGNOSIS — IMO0001 Reserved for inherently not codable concepts without codable children: Secondary | ICD-10-CM

## 2015-07-20 DIAGNOSIS — I10 Essential (primary) hypertension: Secondary | ICD-10-CM | POA: Diagnosis not present

## 2015-07-20 MED ORDER — LOSARTAN POTASSIUM 100 MG PO TABS: 100.0000 mg | ORAL_TABLET | Freq: Every day | ORAL | Status: DC

## 2015-07-20 NOTE — Assessment & Plan Note (Signed)
Chronic, stable. Lisinopril may have caused cough. Will change from lisinopril 20mg  bid to losartan 100mg  daily. Return 10d for lab check. I have asked him to monitor blood pressures closely at home over next several weeks with change in regimen and update me if high, low bp or new symptoms develop.
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
## 2015-07-20 NOTE — Assessment & Plan Note (Signed)
Chronic. Check A1c at f/u labs. Foot exam today. Pt does not check sugars at home.

## 2015-07-20 NOTE — Progress Notes (Signed)
Pre visit review using our clinic review tool, if applicable. No additional management support is needed unless otherwise documented below in the visit note. 

## 2015-07-20 NOTE — Assessment & Plan Note (Signed)
Declines further treatment at this time. States flonase, antihistamine ineffective.

## 2015-07-20 NOTE — Assessment & Plan Note (Signed)
Preventative protocols reviewed and updated unless pt declined. Discussed healthy diet and lifestyle.  

## 2015-07-20 NOTE — Assessment & Plan Note (Addendum)
Describes chest pressure/tightness with exertion, but not relieved by rest.  Risk factors include HTN (controlled), DM (controlled), fmhx CAD. Will check EKG today and likely refer to cardiology to discuss further risk stratification with stress testing.  EKG - NSR rate 60s, mild LAD, normal intervals, nonspecific ST changes with isolated q V1

## 2015-07-20 NOTE — Assessment & Plan Note (Signed)
Persistent - ongoing for years. Small piece of cerumen anterior canal otherwise normal exam. S/p cerumen irrigation today.

## 2015-07-20 NOTE — Patient Instructions (Addendum)
Stop lisinopril. Start losartan 100mg  daily. Monitor blood pressure at home and let me know if too high or too low. Return 1 week after starting losartan for fasting labs in the morning.  Left ear irrigation performed today. EKG today - we may refer you to heart doctor pending EKG results.  Good to see you today, call us with questions. Return in 6 months for diabetes follow up, sooner if needed.  Health Maintenance, Male A healthy lifestyle and preventative care can promote health and wellness.  Maintain regular health, dental, and eye exams.  Eat a healthy diet. Foods like vegetables, fruits, whole grains, low-fat dairy products, and lean protein foods contain the nutrients you need and are low in calories. Decrease your intake of foods high in solid fats, added sugars, and salt. Get information about a proper diet from your health care provider, if necessary.  Regular physical exercise is one of the most important things you can do for your health. Most adults should get at least 150 minutes of moderate-intensity exercise (any activity that increases your heart rate and causes you to sweat) each week. In addition, most adults need muscle-strengthening exercises on 2 or more days a week.   Maintain a healthy weight. The body mass index (BMI) is a screening tool to identify possible weight problems. It provides an estimate of body fat based on height and weight. Your health care provider can find your BMI and can help you achieve or maintain a healthy weight. For males 20 years and older:  A BMI below 18.5 is considered underweight.  A BMI of 18.5 to 24.9 is normal.  A BMI of 25 to 29.9 is considered overweight.  A BMI of 30 and above is considered obese.  Maintain normal blood lipids and cholesterol by exercising and minimizing your intake of saturated fat. Eat a balanced diet with plenty of fruits and vegetables. Blood tests for lipids and cholesterol should begin at age 45 and be  repeated every 5 years. If your lipid or cholesterol levels are high, you are over age 25, or you are at high risk for heart disease, you may need your cholesterol levels checked more frequently.Ongoing high lipid and cholesterol levels should be treated with medicines if diet and exercise are not working.  If you smoke, find out from your health care provider how to quit. If you do not use tobacco, do not start.  Lung cancer screening is recommended for adults aged 59-80 years who are at high risk for developing lung cancer because of a history of smoking. A yearly low-dose CT scan of the lungs is recommended for people who have at least a 30-pack-year history of smoking and are current smokers or have quit within the past 15 years. A pack year of smoking is smoking an average of 1 pack of cigarettes a day for 1 year (for example, a 30-pack-year history of smoking could mean smoking 1 pack a day for 30 years or 2 packs a day for 15 years). Yearly screening should continue until the smoker has stopped smoking for at least 15 years. Yearly screening should be stopped for people who develop a health problem that would prevent them from having lung cancer treatment.  If you choose to drink alcohol, do not have more than 2 drinks per day. One drink is considered to be 12 oz (360 mL) of beer, 5 oz (150 mL) of wine, or 1.5 oz (45 mL) of liquor.  Avoid the use of street  drugs. Do not share needles with anyone. Ask for help if you need support or instructions about stopping the use of drugs.  High blood pressure causes heart disease and increases the risk of stroke. High blood pressure is more likely to develop in:  People who have blood pressure in the end of the normal range (100-139/85-89 mm Hg).  People who are overweight or obese.  People who are African American.  If you are 34-27 years of age, have your blood pressure checked every 3-5 years. If you are 37 years of age or older, have your blood  pressure checked every year. You should have your blood pressure measured twice--once when you are at a hospital or clinic, and once when you are not at a hospital or clinic. Record the average of the two measurements. To check your blood pressure when you are not at a hospital or clinic, you can use:  An automated blood pressure machine at a pharmacy.  A home blood pressure monitor.  If you are 51-81 years old, ask your health care provider if you should take aspirin to prevent heart disease.  Diabetes screening involves taking a blood sample to check your fasting blood sugar level. This should be done once every 3 years after age 71 if you are at a normal weight and without risk factors for diabetes. Testing should be considered at a younger age or be carried out more frequently if you are overweight and have at least 1 risk factor for diabetes.  Colorectal cancer can be detected and often prevented. Most routine colorectal cancer screening begins at the age of 92 and continues through age 84. However, your health care provider may recommend screening at an earlier age if you have risk factors for colon cancer. On a yearly basis, your health care provider may provide home test kits to check for hidden blood in the stool. A small camera at the end of a tube may be used to directly examine the colon (sigmoidoscopy or colonoscopy) to detect the earliest forms of colorectal cancer. Talk to your health care provider about this at age 60 when routine screening begins. A direct exam of the colon should be repeated every 5-10 years through age 45, unless early forms of precancerous polyps or small growths are found.  People who are at an increased risk for hepatitis B should be screened for this virus. You are considered at high risk for hepatitis B if:  You were born in a country where hepatitis B occurs often. Talk with your health care provider about which countries are considered high risk.  Your  parents were born in a high-risk country and you have not received a shot to protect against hepatitis B (hepatitis B vaccine).  You have HIV or AIDS.  You use needles to inject street drugs.  You live with, or have sex with, someone who has hepatitis B.  You are a man who has sex with other men (MSM).  You get hemodialysis treatment.  You take certain medicines for conditions like cancer, organ transplantation, and autoimmune conditions.  Hepatitis C blood testing is recommended for all people born from [birth year] through 1967 and any individual with known risk factors for hepatitis C.  Healthy men should no longer receive prostate-specific antigen (PSA) blood tests as part of routine cancer screening. Talk to your health care provider about prostate cancer screening.  Testicular cancer screening is not recommended for adolescents or adult males who have no symptoms. Screening includes  self-exam, a health care provider exam, and other screening tests. Consult with your health care provider about any symptoms you have or any concerns you have about testicular cancer.  Practice safe sex. Use condoms and avoid high-risk sexual practices to reduce the spread of sexually transmitted infections (STIs).  You should be screened for STIs, including gonorrhea and chlamydia if:  You are sexually active and are younger than 24 years.  You are older than 24 years, and your health care provider tells you that you are at risk for this type of infection.  Your sexual activity has changed since you were last screened, and you are at an increased risk for chlamydia or gonorrhea. Ask your health care provider if you are at risk.  If you are at risk of being infected with HIV, it is recommended that you take a prescription medicine daily to prevent HIV infection. This is called pre-exposure prophylaxis (PrEP). You are considered at risk if:  You are a man who has sex with other men (MSM).  You are a  heterosexual man who is sexually active with multiple partners.  You take drugs by injection.  You are sexually active with a partner who has HIV.  Talk with your health care provider about whether you are at high risk of being infected with HIV. If you choose to begin PrEP, you should first be tested for HIV. You should then be tested every 3 months for as long as you are taking PrEP.  Use sunscreen. Apply sunscreen liberally and repeatedly throughout the day. You should seek shade when your shadow is shorter than you. Protect yourself by wearing long sleeves, pants, a wide-brimmed hat, and sunglasses year round whenever you are outdoors.  Tell your health care provider of new moles or changes in moles, especially if there is a change in shape or color. Also, tell your health care provider if a mole is larger than the size of a pencil eraser.  A one-time screening for abdominal aortic aneurysm (AAA) and surgical repair of large AAAs by ultrasound is recommended for men aged 78-75 years who are current or former smokers.  Stay current with your vaccines (immunizations).   This information is not intended to replace advice given to you by your health care provider. Make sure you discuss any questions you have with your health care provider.   Document Released: 09/06/2007 Document Revised: 03/31/2014 Document Reviewed: 08/05/2010 Elsevier Interactive Patient Education [employer].

## 2015-07-23 NOTE — Addendum Note (Signed)
Addended by: Ria Bush on: 07/23/2015 08:03 AM   Modules accepted: Orders, SmartSet

## 2015-08-14 ENCOUNTER — Ambulatory Visit: Payer: BLUE CROSS/BLUE SHIELD | Admitting: Cardiovascular Disease

## 2015-08-16 NOTE — Progress Notes (Signed)
Cardiology Office Note   Date:  08/17/2015   ID:  Joel Bennett, DOB 02/25/1966, MRN [id number]  PCP:  Joel Bush, MD  Cardiologist:   Joel Latch, MD   Chief Complaint  Patient presents with  . New Patient (Initial Visit)    SOB;randomly. CHEST PAIN;randomly. LIGHTHEADED/DIZZINESS;randomly. PAIN OR CRAMPS IN LEGS;none.  EDEMA; in right leg ocassionally.      History of Present Illness: Joel Bennett is a 50 y.o. male with hypertension and diabetes who presents for an evaluation of chest pain.  Joel Bennett saw his PCP, Dr. Ria Bennett, on 07/20/15.  At that appointment he reported intermittent episodes of chest pain and was referred to cardiology for further evaluation.  He reports that for the last one to 2 months has had episodes of substernal chest pain at least once per day. Each episode lasts for a few minutes at a time. It occurs both at rest and with exertion, but is more likely to happen with exertion. The pain is 4-5 out of 10 in severity and he describes it as mostly pressure but sometimes sharp. At times he has shortness of breath with it, though he also has shortness of breath at baseline. He denies nausea or diaphoresis.  Joel Bennett denies lower extremity edema, orthopnea or PND.    Joel Bennett was diagnosed with diabetes 5 years ago.  His PCP recently stopped his ACE inhibitor due to cough and he was started on an ARB. The cough has improved. He has struggled with his weight for years. He reports that he is very physically active but does not formally exercise.  He struggles with portion control.   Past Medical History  Diagnosis Date  . Diabetes type 2, uncontrolled ([place]) 2012  . Hypertension 1994  . DDD (degenerative disc disease), lumbar   . History of depression 2000    treated with meds  . Allergic rhinitis   . Gout   . Stool incontinence 12/2011    neg giardia, neg O&P, neg iFOB from prior PCP  . Allergy   . Diabetes mellitus without  complication [facility])     Past Surgical History  Procedure Laterality Date  . Shoulder surgery Left 2002    x 2  . Back surgery  1997    Lumbar  . Ankle surgery Left 1999  . Knee surgery Right 1985  . Colonoscopy  05/2014    TA several, rpt 3 yrs [facility])     Current Outpatient Prescriptions  Medication Sig Dispense Refill  . aspirin 81 MG tablet Take 81 mg by mouth daily.    Marland Kitchen glucose blood (FREESTYLE LITE) test strip Use as instructed to check sugar 1-2 times daily as needed. Dx: E11.65 100 each 3  . indomethacin (INDOCIN) 50 MG capsule TAKE 1 CAPSULE (50 MG TOTAL) BY MOUTH 3 (THREE) TIMES DAILY WITH MEALS. 90 capsule 3  . losartan (COZAAR) 100 MG tablet Take 1 tablet (100 mg total) by mouth daily. 90 tablet 3  . metFORMIN (GLUCOPHAGE) 500 MG tablet Take 1 tablet (500 mg total) by mouth 2 (two) times daily with a meal. 180 tablet 3   No current facility-administered medications for this visit.    Allergies:   Review of patient's allergies indicates no known allergies.    Social History:  The patient  reports that he has never smoked. His smokeless tobacco use includes Snuff. He reports that he drinks alcohol. He reports that he does not use illicit drugs.  Family History:  The patient's family history includes Alcohol abuse in his maternal grandfather, paternal grandfather, and paternal uncle; Atrial fibrillation in his father; CAD in his paternal grandmother and paternal uncle; CAD (age of onset: 60) in his paternal aunt; Cancer in his maternal aunt, maternal aunt, mother, other, and paternal grandmother; Diabetes in his cousin and father; Rectal cancer in his maternal aunt; Stroke in his cousin and maternal grandmother. There is no history of Colon polyps, Esophageal cancer, or Stomach cancer.    ROS:  Please see the history of present illness.   Otherwise, review of systems are positive for Right heel pain.   All other systems are reviewed and negative.    PHYSICAL EXAM: VS:   BP 139/85 mmHg  Pulse 67  Ht 5\' 9"  (1.753 m)  Wt 126.916 kg (279 lb 12.8 oz)  BMI 41.30 kg/m2 , BMI Body mass index is 41.3 kg/(m^2). GENERAL:  Well appearing HEENT:  Pupils equal round and reactive, fundi not visualized, oral mucosa unremarkable NECK:  No jugular venous distention, waveform within normal limits, carotid upstroke brisk and symmetric, no bruits, no thyromegaly LYMPHATICS:  No cervical adenopathy LUNGS:  Clear to auscultation bilaterally HEART:  RRR.  PMI not displaced or sustained,S1 and S2 within normal limits, no S3, no S4, no clicks, no rubs, no murmurs ABD:  Flat, positive bowel sounds normal in frequency in pitch, no bruits, no rebound, no guarding, no midline pulsatile mass, no hepatomegaly, no splenomegaly EXT:  2 plus pulses throughout, no edema, no cyanosis no clubbing SKIN:  No rashes no nodules NEURO:  Cranial nerves II through XII grossly intact, motor grossly intact throughout PSYCH:  Cognitively intact, oriented to person place and time  EKG:  EKG is not ordered today. The ekg ordered 07/20/15 demonstrates sinus rhythm rate 62 bpm. Nonspecific T wave abnormalities.    Recent Labs: 12/26/2014: ALT 23; BUN 9; Creatinine, Ser 1.02; Hemoglobin 15.6; Platelets 200.0; Potassium 4.3; Sodium 140; TSH 1.24    Lipid Panel    Component Value Date/Time   CHOL 167 02/20/2014 0918   TRIG 189.0* 02/20/2014 0918   HDL 31.10* 02/20/2014 0918   CHOLHDL 5 02/20/2014 0918   VLDL 37.8 02/20/2014 0918   LDLCALC 98 02/20/2014 0918   LDLDIRECT 102.0 12/26/2014 1730      Wt Readings from Last 3 Encounters:  08/17/15 126.916 kg (279 lb 12.8 oz)  07/20/15 125.079 kg (275 lb 12 oz)  05/11/15 128.935 kg (284 lb 4 oz)      ASSESSMENT AND PLAN:  # Chest pain: Symptoms are concerning for ischemia. We will obtain a Lexiscan Myoview to evaluate. We will also start aspirin 81 mg daily. We will determine whether he needs this long-term once he has his stress test and his lipids  are available.   # Hypertension: Blood pressure is well-controlled. Continue losartan.  # Hyperlipidemia: Joel Bennett has lipids pending. In the past his lipids have been mildly elevated. Once this is available we will calculate his ASCVD 10 year risk to determine whether or not he needs a statin.   # Morbid obesity: We discussed the importance of regular exercise and portion control.  I recommended that he increase his fruit and vegetable intake. He expressed understanding.    Current medicines are reviewed at length with the patient today.  The patient does not have concerns regarding medicines.  The following changes have been made:  Start aspirin 81 mg daily.  Labs/ tests ordered today include:  Orders Placed This Encounter  Procedures  . Myocardial Perfusion Imaging    Disposition:   FU with Theresa Dohrman C. Oval Linsey, MD, [facility] in 1 month.    This note was written with the assistance of speech recognition software.  Please excuse any transcriptional errors.  Signed, Ziah Leandro C. Oval Linsey, MD, [facility]  08/17/2015 8:41 AM    [place]

## 2015-08-17 ENCOUNTER — Encounter: Payer: Self-pay | Admitting: Cardiovascular Disease

## 2015-08-17 ENCOUNTER — Ambulatory Visit (INDEPENDENT_AMBULATORY_CARE_PROVIDER_SITE_OTHER): Payer: BLUE CROSS/BLUE SHIELD | Admitting: Cardiovascular Disease

## 2015-08-17 VITALS — BP 139/85 | HR 67 | Ht 69.0 in | Wt 279.8 lb

## 2015-08-17 DIAGNOSIS — E785 Hyperlipidemia, unspecified: Secondary | ICD-10-CM

## 2015-08-17 DIAGNOSIS — R079 Chest pain, unspecified: Secondary | ICD-10-CM

## 2015-08-17 DIAGNOSIS — I10 Essential (primary) hypertension: Secondary | ICD-10-CM

## 2015-08-17 NOTE — Patient Instructions (Addendum)
Medication Instructions:  START ASPIRIN 81 MG DAILY   Labwork: none  Testing/Procedures: Your physician has requested that you have a lexiscan myoview. For further information please visit [email or web address]. Please follow instruction sheet, as given. 2 DAY STUDY   Follow-Up: Your physician recommends that you schedule a follow-up appointment in: about 1 month for ov  If you need a refill on your cardiac medications before your next appointment, please call your pharmacy.

## 2015-08-21 ENCOUNTER — Telehealth (HOSPITAL_COMMUNITY): Payer: Self-pay

## 2015-08-21 NOTE — Telephone Encounter (Signed)
Encounter complete. 

## 2015-08-22 ENCOUNTER — Ambulatory Visit (HOSPITAL_COMMUNITY)
Admission: RE | Admit: 2015-08-22 | Discharge: 2015-08-22 | Disposition: A | Discharge status: Home / Self Care | Payer: BLUE CROSS/BLUE SHIELD | Source: Ambulatory Visit | Attending: Cardiovascular Disease | Admitting: Cardiovascular Disease

## 2015-08-22 DIAGNOSIS — Z8249 Family history of ischemic heart disease and other diseases of the circulatory system: Secondary | ICD-10-CM | POA: Insufficient documentation

## 2015-08-22 DIAGNOSIS — E119 Type 2 diabetes mellitus without complications: Secondary | ICD-10-CM | POA: Insufficient documentation

## 2015-08-22 DIAGNOSIS — I10 Essential (primary) hypertension: Secondary | ICD-10-CM | POA: Insufficient documentation

## 2015-08-22 DIAGNOSIS — Z6841 Body Mass Index (BMI) 40.0 and over, adult: Secondary | ICD-10-CM | POA: Diagnosis not present

## 2015-08-22 DIAGNOSIS — E669 Obesity, unspecified: Secondary | ICD-10-CM | POA: Diagnosis not present

## 2015-08-22 DIAGNOSIS — R5383 Other fatigue: Secondary | ICD-10-CM | POA: Diagnosis not present

## 2015-08-22 DIAGNOSIS — R0602 Shortness of breath: Secondary | ICD-10-CM | POA: Diagnosis not present

## 2015-08-22 DIAGNOSIS — R079 Chest pain, unspecified: Secondary | ICD-10-CM | POA: Diagnosis not present

## 2015-08-22 DIAGNOSIS — R42 Dizziness and giddiness: Secondary | ICD-10-CM | POA: Insufficient documentation

## 2015-08-22 MED ORDER — REGADENOSON 0.4 MG/5ML IV SOLN
0.4000 mg | Freq: Once | INTRAVENOUS | Status: AC
  Administered 2015-08-22: 0.4 mg via INTRAVENOUS

## 2015-08-22 MED ORDER — TECHNETIUM TC 99M TETROFOSMIN IV KIT
31.2000 | PACK | Freq: Once | INTRAVENOUS | Status: AC | PRN
  Administered 2015-08-22: 31.2 via INTRAVENOUS
  Filled 2015-08-22: qty 31

## 2015-08-23 ENCOUNTER — Ambulatory Visit (HOSPITAL_COMMUNITY)
Admission: RE | Admit: 2015-08-23 | Discharge: 2015-08-23 | Disposition: A | Discharge status: Home / Self Care | Payer: BLUE CROSS/BLUE SHIELD | Source: Ambulatory Visit | Attending: Cardiology | Admitting: Cardiology

## 2015-08-23 HISTORY — PX: CARDIOVASCULAR STRESS TEST: SHX262

## 2015-08-23 LAB — MYOCARDIAL PERFUSION IMAGING
LV dias vol: 97 mL (ref 62–150)
LV sys vol: 41 mL
Peak HR: 77 {beats}/min
Rest HR: 65 {beats}/min
SDS: 5
SRS: 0
SSS: 5
TID: 0.97

## 2015-08-23 MED ORDER — TECHNETIUM TC 99M TETROFOSMIN IV KIT
30.1000 | PACK | Freq: Once | INTRAVENOUS | Status: AC | PRN
  Administered 2015-08-23: 30.1 via INTRAVENOUS

## 2015-08-24 ENCOUNTER — Encounter: Payer: Self-pay | Admitting: Family Medicine

## 2015-10-01 ENCOUNTER — Telehealth: Payer: Self-pay

## 2015-10-01 NOTE — Telephone Encounter (Signed)
lmtcb to update family and medical history

## 2015-10-07 NOTE — Progress Notes (Signed)
Cardiology Office Note   Date:  10/08/2015   ID:  Joel Bennett, DOB [birth year], MRN [id number]  PCP:  Ria Bush, MD  Cardiologist:   Skeet Latch, MD   Chief Complaint  Patient presents with  . Follow-up    chest pain; pressure. lightheaded; due to medication.pain; in your legs. edema; in feet       History of Present Illness: Joel Bennett is a 50 y.o. male with hypertension and diabetes who presents for followup on chest pain.  Joel Bennett was first seen 07/2015 with chest pain.  He was referred for [facility] and started on aspirin.  Lexiscan Myoview was negative for ischemia 08/22/15.  LVEF was 57%.  He continues to have episodes of chest discomfort. He notes that it typically occurs in the setting of stressful situations. He has been having a lot of problems with his daughter, his father was recently diagnosed with atrial fibrillation and his aunt recently died. He was very close with this on. He notes that he is easily upset. He has not been eating well. He denies any SI/HI.  He continues to sleep well and continues to enjoy certain aspects of his life.  He has noted more difficulty concentrating lately.  Mr. Boik has not noted exertional chest pain or dyspnea.   He also denies lower extremity edema, orthopnea or PND.  Past Medical History  Diagnosis Date  . Diabetes type 2, uncontrolled ([place]) 2012  . Hypertension 1994  . DDD (degenerative disc disease), lumbar   . History of depression 2000    treated with meds  . Allergic rhinitis   . Gout   . Stool incontinence 12/2011    neg giardia, neg O&P, neg iFOB from prior PCP  . Allergy   . Diabetes mellitus without complication [facility])     Past Surgical History  Procedure Laterality Date  . Shoulder surgery Left 2002    x 2  . Back surgery  1997    Lumbar  . Ankle surgery Left 1999  . Knee surgery Right 1985  . Colonoscopy  05/2014    TA several, rpt 3 yrs Ardis Hughs)  . Cardiovascular stress test   08/2015    WNL (Dr Oval Linsey)     Current Outpatient Prescriptions  Medication Sig Dispense Refill  . glucose blood (FREESTYLE LITE) test strip Use as instructed to check sugar 1-2 times daily as needed. Dx: E11.65 100 each 3  . indomethacin (INDOCIN) 50 MG capsule TAKE 1 CAPSULE (50 MG TOTAL) BY MOUTH 3 (THREE) TIMES DAILY WITH MEALS. 90 capsule 3  . losartan (COZAAR) 100 MG tablet Take 1 tablet (100 mg total) by mouth daily. 90 tablet 3  . metFORMIN (GLUCOPHAGE) 500 MG tablet Take 1 tablet (500 mg total) by mouth 2 (two) times daily with a meal. 180 tablet 3  . escitalopram (LEXAPRO) 10 MG tablet Take 1 tablet (10 mg total) by mouth daily. 30 tablet 3   No current facility-administered medications for this visit.    Allergies:   Review of patient's allergies indicates no known allergies.    Social History:  The patient  reports that he has never smoked. His smokeless tobacco use includes Snuff. He reports that he drinks alcohol. He reports that he does not use illicit drugs.   Family History:  The patient's family history includes Alcohol abuse in his maternal grandfather, paternal grandfather, and paternal uncle; Atrial fibrillation in his father; CAD in his paternal grandmother and paternal uncle; CAD (  age of onset: 87) in his paternal aunt; Cancer in his maternal aunt, maternal aunt, mother, other, and paternal grandmother; Diabetes in his cousin and father; Rectal cancer in his maternal aunt; Stroke in his cousin and maternal grandmother. There is no history of Colon polyps, Esophageal cancer, or Stomach cancer.    ROS:  Please see the history of present illness.   Otherwise, review of systems are positive for Right heel pain.   All other systems are reviewed and negative.    PHYSICAL EXAM: VS:  BP 128/82 mmHg  Pulse 78  Ht 5\' 9"  (1.753 m)  Wt 274 lb 6.4 oz (124.467 kg)  BMI 40.50 kg/m2 , BMI Body mass index is 40.5 kg/(m^2). GENERAL:  Well appearing HEENT:  Pupils equal round  and reactive, fundi not visualized, oral mucosa unremarkable NECK:  No jugular venous distention, waveform within normal limits, carotid upstroke brisk and symmetric, no bruits, no thyromegaly LYMPHATICS:  No cervical adenopathy LUNGS:  Clear to auscultation bilaterally HEART:  RRR.  PMI not displaced or sustained,S1 and S2 within normal limits, no S3, no S4, no clicks, no rubs, no murmurs ABD:  Flat, positive bowel sounds normal in frequency in pitch, no bruits, no rebound, no guarding, no midline pulsatile mass, no hepatomegaly, no splenomegaly EXT:  2 plus pulses throughout, no edema, no cyanosis no clubbing SKIN:  No rashes no nodules NEURO:  Cranial nerves II through XII grossly intact, motor grossly intact throughout PSYCH:  Cognitively intact, oriented to person place and time  EKG:  EKG is not ordered today. The ekg ordered 07/20/15 demonstrates sinus rhythm rate 62 bpm. Nonspecific T wave abnormalities.    Lexiscan Myoview 08/22/15:  The left ventricular ejection fraction is normal (55-65%).  Nuclear stress EF: 57%.  There was no ST segment deviation noted during stress.  The study is normal.  This is a low risk study.  Normal resting and stress perfusion EF 57%   Recent Labs: 12/26/2014: ALT 23; BUN 9; Creatinine, Ser 1.02; Hemoglobin 15.6; Platelets 200.0; Potassium 4.3; Sodium 140; TSH 1.24    Lipid Panel    Component Value Date/Time   CHOL 167 02/20/2014 0918   TRIG 189.0* 02/20/2014 0918   HDL 31.10* 02/20/2014 0918   CHOLHDL 5 02/20/2014 0918   VLDL 37.8 02/20/2014 0918   LDLCALC 98 02/20/2014 0918   LDLDIRECT 102.0 12/26/2014 1730      Wt Readings from Last 3 Encounters:  10/08/15 274 lb 6.4 oz (124.467 kg)  08/22/15 279 lb (126.554 kg)  08/17/15 279 lb 12.8 oz (126.916 kg)      ASSESSMENT AND PLAN:  # Chest pain: Stress test was negative for ischemia. It seems as though his symptoms are more stressed-related.   # Hypertension: Blood pressure is  well-controlled. Continue losartan.  # Hyperlipidemia: Mr. Laessig will return for fasting lipids. Given that he has diabetes he should also be on a statin. We will assess his ASCVD 10 year risk once the lipids are available.   # Calf pain: Mr. Mannings reports right calf pain and swelling.  We will check a right lower extremity Doppler to rule out DVT. He reports that this is different that his sciatica pain.  # Depression, Anxiety: Start Lexapro 10 mg daily.  Patient will follow-up with his PCP regarding this issue.   Current medicines are reviewed at length with the patient today.  The patient does not have concerns regarding medicines.  The following changes have been made:  Start Lexapro  10 mg daily.  Labs/ tests ordered today include:   Orders Placed This Encounter  Procedures  . Lipid panel    Disposition:   FU with Nichlos Kunzler C. Oval Linsey, MD, [facility] prn   This note was written with the assistance of speech recognition software.  Please excuse any transcriptional errors.  Signed, Romelle Reiley C. Oval Linsey, MD, [facility]  10/08/2015 2:32 PM    [facility]

## 2015-10-08 ENCOUNTER — Ambulatory Visit (INDEPENDENT_AMBULATORY_CARE_PROVIDER_SITE_OTHER): Payer: BLUE CROSS/BLUE SHIELD | Admitting: Cardiovascular Disease

## 2015-10-08 ENCOUNTER — Encounter: Payer: Self-pay | Admitting: Cardiovascular Disease

## 2015-10-08 VITALS — BP 128/82 | HR 78 | Ht 69.0 in | Wt 274.4 lb

## 2015-10-08 DIAGNOSIS — F329 Major depressive disorder, single episode, unspecified: Secondary | ICD-10-CM

## 2015-10-08 DIAGNOSIS — R0789 Other chest pain: Secondary | ICD-10-CM

## 2015-10-08 DIAGNOSIS — M7989 Other specified soft tissue disorders: Secondary | ICD-10-CM

## 2015-10-08 DIAGNOSIS — I10 Essential (primary) hypertension: Secondary | ICD-10-CM

## 2015-10-08 DIAGNOSIS — F32A Depression, unspecified: Secondary | ICD-10-CM

## 2015-10-08 DIAGNOSIS — M79604 Pain in right leg: Secondary | ICD-10-CM

## 2015-10-08 DIAGNOSIS — F419 Anxiety disorder, unspecified: Secondary | ICD-10-CM | POA: Insufficient documentation

## 2015-10-08 DIAGNOSIS — E785 Hyperlipidemia, unspecified: Secondary | ICD-10-CM | POA: Diagnosis not present

## 2015-10-08 MED ORDER — ESCITALOPRAM OXALATE 10 MG PO TABS: 10.0000 mg | ORAL_TABLET | Freq: Every day | ORAL | Status: DC

## 2015-10-08 NOTE — Patient Instructions (Signed)
Medication Instructions:  START LEXAPRO 10 MG DAILY, RX SENT TO PHARMACY  Labwork: LIPID PANEL AT [facility]   Testing/Procedures: Your physician has requested that you have a lower or upper extremity venous duplex. This test is an ultrasound of the veins in the legs or arms. It looks at venous blood flow that carries blood from the heart to the legs or arms. Allow one hour for a Lower Venous exam. Allow thirty minutes for an Upper Venous exam. There are no restrictions or special instructions. LOWER RIGHT LET  Follow-Up: AS NEEDED   If you need a refill on your cardiac medications before your next appointment, please call your pharmacy.

## 2015-10-09 ENCOUNTER — Ambulatory Visit: Payer: BLUE CROSS/BLUE SHIELD | Admitting: Cardiovascular Disease

## 2015-10-15 ENCOUNTER — Ambulatory Visit (HOSPITAL_COMMUNITY)
Admission: RE | Admit: 2015-10-15 | Discharge: 2015-10-15 | Disposition: A | Discharge status: Home / Self Care | Payer: BLUE CROSS/BLUE SHIELD | Source: Ambulatory Visit | Attending: Cardiovascular Disease | Admitting: Cardiovascular Disease

## 2015-10-15 DIAGNOSIS — M79604 Pain in right leg: Secondary | ICD-10-CM

## 2015-10-15 DIAGNOSIS — E119 Type 2 diabetes mellitus without complications: Secondary | ICD-10-CM | POA: Diagnosis not present

## 2015-10-15 DIAGNOSIS — M7989 Other specified soft tissue disorders: Secondary | ICD-10-CM

## 2015-10-15 DIAGNOSIS — I10 Essential (primary) hypertension: Secondary | ICD-10-CM | POA: Insufficient documentation

## 2015-12-30 ENCOUNTER — Other Ambulatory Visit: Payer: Self-pay | Admitting: Family Medicine

## 2016-01-24 ENCOUNTER — Other Ambulatory Visit (INDEPENDENT_AMBULATORY_CARE_PROVIDER_SITE_OTHER): Payer: BLUE CROSS/BLUE SHIELD

## 2016-01-24 ENCOUNTER — Other Ambulatory Visit: Payer: Self-pay

## 2016-01-24 DIAGNOSIS — Z1211 Encounter for screening for malignant neoplasm of colon: Secondary | ICD-10-CM

## 2016-01-24 DIAGNOSIS — E1165 Type 2 diabetes mellitus with hyperglycemia: Secondary | ICD-10-CM

## 2016-01-24 DIAGNOSIS — I10 Essential (primary) hypertension: Secondary | ICD-10-CM | POA: Diagnosis not present

## 2016-01-24 DIAGNOSIS — Z1159 Encounter for screening for other viral diseases: Secondary | ICD-10-CM | POA: Diagnosis not present

## 2016-01-24 DIAGNOSIS — IMO0001 Reserved for inherently not codable concepts without codable children: Secondary | ICD-10-CM

## 2016-01-24 LAB — LIPID PANEL
Cholesterol: 155 mg/dL (ref 0–200)
HDL: 37.4 mg/dL — ABNORMAL LOW (ref >39.00)
LDL Cholesterol: 89 mg/dL (ref 0–99)
NonHDL: 117.33
Total CHOL/HDL Ratio: 4
Triglycerides: 143 mg/dL (ref 0.0–149.0)
VLDL: 28.6 mg/dL (ref 0.0–40.0)

## 2016-01-24 LAB — BASIC METABOLIC PANEL
BUN: 19 mg/dL (ref 6–23)
CO2: 26 mEq/L (ref 19–32)
Calcium: 9.4 mg/dL (ref 8.4–10.5)
Chloride: 103 mEq/L (ref 96–112)
Creatinine, Ser: 1 mg/dL (ref 0.40–1.50)
GFR: 83.42 mL/min (ref >60.00)
Glucose, Bld: 146 mg/dL — ABNORMAL HIGH (ref 70–99)
Potassium: 4.4 mEq/L (ref 3.5–5.1)
Sodium: 140 mEq/L (ref 135–145)

## 2016-01-24 LAB — PSA: PSA: 0.52 ng/mL (ref 0.10–4.00)

## 2016-01-24 LAB — HEMOGLOBIN A1C: Hgb A1c MFr Bld: 7.4 % — ABNORMAL HIGH (ref 4.6–6.5)

## 2016-01-24 NOTE — Addendum Note (Signed)
Addended by: Marchia Bond on: 01/24/2016 09:31 AM   Modules accepted: Orders

## 2016-01-25 LAB — HEPATITIS C ANTIBODY: HCV Ab: NEGATIVE

## 2016-01-30 ENCOUNTER — Ambulatory Visit: Payer: BLUE CROSS/BLUE SHIELD | Admitting: Family Medicine

## 2016-01-30 ENCOUNTER — Other Ambulatory Visit: Payer: Self-pay | Admitting: Cardiovascular Disease

## 2016-01-30 NOTE — Telephone Encounter (Signed)
Review for refill. 

## 2016-01-31 DIAGNOSIS — H5203 Hypermetropia, bilateral: Secondary | ICD-10-CM | POA: Diagnosis not present

## 2016-01-31 DIAGNOSIS — E119 Type 2 diabetes mellitus without complications: Secondary | ICD-10-CM | POA: Diagnosis not present

## 2016-01-31 DIAGNOSIS — H524 Presbyopia: Secondary | ICD-10-CM | POA: Diagnosis not present

## 2016-01-31 LAB — HM DIABETES EYE EXAM

## 2016-02-08 ENCOUNTER — Other Ambulatory Visit: Payer: Self-pay | Admitting: *Deleted

## 2016-02-08 MED ORDER — ESCITALOPRAM OXALATE 10 MG PO TABS: 10.0000 mg | ORAL_TABLET | Freq: Every day | ORAL | 3 refills | Status: DC

## 2016-02-20 ENCOUNTER — Encounter: Payer: Self-pay | Admitting: *Deleted

## 2016-02-26 ENCOUNTER — Encounter: Payer: Self-pay | Admitting: *Deleted

## 2016-03-02 ENCOUNTER — Other Ambulatory Visit: Payer: Self-pay | Admitting: Family Medicine

## 2016-05-02 ENCOUNTER — Other Ambulatory Visit: Payer: Self-pay | Admitting: Family Medicine

## 2016-05-08 ENCOUNTER — Telehealth: Payer: Self-pay | Admitting: *Deleted

## 2016-05-08 ENCOUNTER — Other Ambulatory Visit: Payer: Self-pay | Admitting: Cardiovascular Disease

## 2016-05-08 NOTE — Telephone Encounter (Signed)
Refill Request.  

## 2016-05-08 NOTE — Telephone Encounter (Signed)
Received request from pharmacy on Lexapro Left message to call back  Per last office note patient needs to get from PCP

## 2016-05-15 NOTE — Telephone Encounter (Signed)
Left message to call back  

## 2016-05-28 NOTE — Telephone Encounter (Signed)
Left message to call back  

## 2016-06-07 ENCOUNTER — Other Ambulatory Visit: Payer: Self-pay | Admitting: Cardiovascular Disease

## 2016-06-09 NOTE — Telephone Encounter (Signed)
Will you please review for refill, Thanks! 

## 2016-06-18 NOTE — Telephone Encounter (Signed)
Unable to reach via phone, patient never returned calls

## 2016-06-23 ENCOUNTER — Other Ambulatory Visit: Payer: Self-pay | Admitting: Cardiovascular Disease

## 2016-06-23 NOTE — Telephone Encounter (Signed)
Refill Request.  

## 2016-06-29 ENCOUNTER — Other Ambulatory Visit: Payer: Self-pay | Admitting: Cardiovascular Disease

## 2016-06-30 ENCOUNTER — Other Ambulatory Visit: Payer: Self-pay | Admitting: Cardiovascular Disease

## 2016-07-02 ENCOUNTER — Other Ambulatory Visit: Payer: Self-pay

## 2016-07-02 NOTE — Telephone Encounter (Signed)
Rx refused. See last office note from 10/08/15.

## 2016-07-03 NOTE — Telephone Encounter (Signed)
OK to refill this time.  Please use PCP in the future.

## 2016-07-04 MED ORDER — ESCITALOPRAM OXALATE 10 MG PO TABS: 10.0000 mg | ORAL_TABLET | Freq: Every day | ORAL | 0 refills | Status: DC

## 2016-07-04 NOTE — Telephone Encounter (Signed)
Rx(s) sent to pharmacy electronically.  

## 2016-07-08 ENCOUNTER — Other Ambulatory Visit: Payer: Self-pay | Admitting: Family Medicine

## 2016-07-24 ENCOUNTER — Other Ambulatory Visit: Payer: Self-pay | Admitting: Family Medicine

## 2016-07-25 ENCOUNTER — Encounter: Payer: Self-pay | Admitting: Family Medicine

## 2016-07-26 MED ORDER — LOSARTAN POTASSIUM 100 MG PO TABS: 100.0000 mg | ORAL_TABLET | Freq: Every day | ORAL | 3 refills | Status: DC

## 2016-07-26 NOTE — Telephone Encounter (Signed)
Can we review our refill policy at [facility] convenience?  I want to make sure we are allowing patients enough time to get into the office for physicals, especially when we as providers may not have availability within the first month when they're due. I don't want to deny BP med refills after 1 month overdue.

## 2016-08-02 ENCOUNTER — Other Ambulatory Visit: Payer: Self-pay | Admitting: Cardiovascular Disease

## 2016-08-04 NOTE — Telephone Encounter (Signed)
Please review for refill. Thanks!  

## 2016-11-25 ENCOUNTER — Other Ambulatory Visit: Payer: Self-pay | Admitting: Family Medicine

## 2016-11-25 NOTE — Telephone Encounter (Signed)
Last OV 06/2015. pls advise

## 2016-11-26 ENCOUNTER — Other Ambulatory Visit: Payer: Self-pay | Admitting: Family Medicine

## 2016-11-28 ENCOUNTER — Ambulatory Visit: Payer: Self-pay | Admitting: Family Medicine

## 2017-02-23 ENCOUNTER — Other Ambulatory Visit: Payer: Self-pay | Admitting: Family Medicine

## 2017-03-16 ENCOUNTER — Ambulatory Visit: Payer: Self-pay | Admitting: Family Medicine

## 2017-03-16 ENCOUNTER — Encounter: Payer: Self-pay | Admitting: Family Medicine

## 2017-03-16 VITALS — BP 118/70 | HR 72 | Temp 97.9°F | Wt 268.0 lb

## 2017-03-16 DIAGNOSIS — R195 Other fecal abnormalities: Secondary | ICD-10-CM

## 2017-03-16 DIAGNOSIS — E1165 Type 2 diabetes mellitus with hyperglycemia: Secondary | ICD-10-CM

## 2017-03-16 DIAGNOSIS — J069 Acute upper respiratory infection, unspecified: Secondary | ICD-10-CM

## 2017-03-16 DIAGNOSIS — I10 Essential (primary) hypertension: Secondary | ICD-10-CM

## 2017-03-16 DIAGNOSIS — IMO0001 Reserved for inherently not codable concepts without codable children: Secondary | ICD-10-CM

## 2017-03-16 LAB — BASIC METABOLIC PANEL
BUN: 12 mg/dL (ref 6–23)
CO2: 27 mEq/L (ref 19–32)
Calcium: 9 mg/dL (ref 8.4–10.5)
Chloride: 101 mEq/L (ref 96–112)
Creatinine, Ser: 1.07 mg/dL (ref 0.40–1.50)
GFR: 76.82 mL/min (ref >60.00)
Glucose, Bld: 177 mg/dL — ABNORMAL HIGH (ref 70–99)
Potassium: 3.9 mEq/L (ref 3.5–5.1)
Sodium: 136 mEq/L (ref 135–145)

## 2017-03-16 LAB — LDL CHOLESTEROL, DIRECT: Direct LDL: 34 mg/dL

## 2017-03-16 LAB — HEMOGLOBIN A1C: Hgb A1c MFr Bld: 10.1 % — ABNORMAL HIGH (ref 4.6–6.5)

## 2017-03-16 MED ORDER — LOSARTAN POTASSIUM 100 MG PO TABS: 100.0000 mg | ORAL_TABLET | Freq: Every day | ORAL | 3 refills | Status: DC

## 2017-03-16 MED ORDER — METFORMIN HCL ER 500 MG PO TB24: 1000.0000 mg | ORAL_TABLET | Freq: Every day | ORAL | 3 refills | Status: DC

## 2017-03-16 NOTE — Progress Notes (Signed)
BP 118/70 (BP Location: Left Arm, Patient Position: Sitting, Cuff Size: Large)   Pulse 72   Temp 97.9 F (36.6 C) (Oral)   Wt 268 lb (121.6 kg)   SpO2 97%   BMI 39.58 kg/m    CC: URI sxs x 2 wks Subjective:    Patient ID: Joel Bennett, male    DOB: 12-Mar-1966, 51 y.o.   MRN: [id number]  HPI: Joel Bennett is a 51 y.o. male presenting on 03/16/2017 for Cough (productive for 2 wks. Tried Delsym 12 hr an Mucinex, no help); Diarrhea (Started 03/13/17. Taken Pepto); and Discuss medication (Wants to discuss metformin)   Last seen 06/2015.  2 wks of productive cough, congestion. Actually today starting to feel better. Was going to cancel appt. No fevers/chills, dyspnea or wheezing. OTC remedies ineffective. Non smoker.  HTN - compliant with losartan 100mg  daily. DM - compliant with metformin 500mg  bid. Concerned metformin may be causing loose stools. Ongoing for months to years. Denies paresthesias. No hypoglycemic symptoms. Checks sugars at home - 115 fasting. Eye exam - due. Pneumovax - declines. Diabetes meter brand: freestyle.   Relevant past medical, surgical, family and social history reviewed and updated as indicated. Interim medical history since our last visit reviewed. Allergies and medications reviewed and updated. Outpatient Medications Prior to Visit  Medication Sig Dispense Refill  . glucose blood (FREESTYLE LITE) test strip Use as instructed to check sugar 1-2 times daily as needed. Dx: E11.65 100 each 3  . indomethacin (INDOCIN) 50 MG capsule TAKE 1 CAPSULE (50 MG TOTAL) BY MOUTH 3 (THREE) TIMES DAILY WITH MEALS. 90 capsule 1  . escitalopram (LEXAPRO) 10 MG tablet Take 1 tablet (10 mg total) by mouth daily. 30 tablet 0  . losartan (COZAAR) 100 MG tablet TAKE 1 TABLET BY MOUTH EVERY DAY 30 tablet 3  . metFORMIN (GLUCOPHAGE) 500 MG tablet TAKE 1 TABLET (500 MG TOTAL) BY MOUTH 2 (TWO) TIMES DAILY WITH A MEAL. 180 tablet 0   No facility-administered medications prior to  visit.      Per HPI unless specifically indicated in ROS section below Review of Systems     Objective:    BP 118/70 (BP Location: Left Arm, Patient Position: Sitting, Cuff Size: Large)   Pulse 72   Temp 97.9 F (36.6 C) (Oral)   Wt 268 lb (121.6 kg)   SpO2 97%   BMI 39.58 kg/m   Wt Readings from Last 3 Encounters:  03/16/17 268 lb (121.6 kg)  10/08/15 274 lb 6.4 oz (124.5 kg)  08/22/15 279 lb (126.6 kg)    Physical Exam  Constitutional: He appears well-developed and well-nourished. No distress.  HENT:  Head: Normocephalic and atraumatic.  Right Ear: Hearing and external ear normal.  Left Ear: Hearing and external ear normal.  Mouth/Throat: Uvula is midline, oropharynx is clear and moist and mucous membranes are normal. No oropharyngeal exudate, posterior oropharyngeal edema, posterior oropharyngeal erythema or tonsillar abscesses.  Eyes: Conjunctivae and EOM are normal. Pupils are equal, round, and reactive to light. No scleral icterus.  Neck: Normal range of motion. Neck supple. No thyromegaly present.  Cardiovascular: Normal rate, regular rhythm, normal heart sounds and intact distal pulses.  No murmur heard. Pulmonary/Chest: Effort normal and breath sounds normal. No respiratory distress. He has no wheezes. He has no rales.  Musculoskeletal: He exhibits no edema.  See HPI for foot exam if done  Lymphadenopathy:    He has no cervical adenopathy.  Skin: Skin is warm and dry.  No rash noted.  Psychiatric: He has a normal mood and affect.  Nursing note and vitals reviewed.  Results for orders placed or performed in visit on 02/26/16  HM DIABETES EYE EXAM  Result Value Ref Range   HM Diabetic Eye Exam No Retinopathy No Retinopathy   Lab Results  Component Value Date   HGBA1C 7.4 (H) 01/24/2016    Lab Results  Component Value Date   CREATININE 1.00 01/24/2016       Assessment & Plan:   Problem List Items Addressed This Visit    Diabetes mellitus type 2,  uncontrolled, without complications ([place]) - Primary    Chronic. Update A1c. Change metformin to XR form given diarrhea/loose stools.      Relevant Medications   losartan (COZAAR) 100 MG tablet   metFORMIN (GLUCOPHAGE XR) 500 MG 24 hr tablet   Other Relevant Orders   Hemoglobin F4E   Basic metabolic panel   LDL Cholesterol, Direct   Hypertension    Chronic, stable. Continue current regimen.       Relevant Medications   losartan (COZAAR) 100 MG tablet   Loose stools    Possibly metformin related - will trial extended release metformin. Update with effect.       URI (upper respiratory infection)    Anticipate viral as improving on its own over last 2 wks.  Update if worsening again, symptoms to seek further care reviewed.           Follow up plan: Return in about 8 months (around 11/14/2017) for annual exam, prior fasting for blood work.  Ria Bush, MD

## 2017-03-16 NOTE — Assessment & Plan Note (Signed)
Chronic, stable. Continue current regimen. 

## 2017-03-16 NOTE — Assessment & Plan Note (Signed)
Possibly metformin related - will trial extended release metformin. Update with effect.

## 2017-03-16 NOTE — Assessment & Plan Note (Signed)
Chronic. Update A1c. Change metformin to XR form given diarrhea/loose stools.

## 2017-03-16 NOTE — Patient Instructions (Addendum)
Change metformin to extended release metformin, take 2 tablets with breakfast. Update me with effect.  Continue losartan - refilled today.  Labs today. We will be in touch with results. Return as needed or in the next year for a physical.

## 2017-03-16 NOTE — Assessment & Plan Note (Signed)
Anticipate viral as improving on its own over last 2 wks.  Update if worsening again, symptoms to seek further care reviewed.

## 2017-03-29 ENCOUNTER — Encounter: Payer: Self-pay | Admitting: Family Medicine

## 2017-03-31 MED ORDER — GUAIFENESIN-CODEINE 100-10 MG/5ML PO SYRP: 5.0000 mL | ORAL_SOLUTION | Freq: Two times a day (BID) | ORAL | 0 refills | Status: DC | PRN

## 2017-06-15 ENCOUNTER — Encounter: Payer: Self-pay | Admitting: Gastroenterology

## 2017-07-11 ENCOUNTER — Encounter: Payer: Self-pay | Admitting: Family Medicine

## 2017-08-15 ENCOUNTER — Encounter: Payer: Self-pay | Admitting: Family Medicine

## 2017-08-20 DIAGNOSIS — H5203 Hypermetropia, bilateral: Secondary | ICD-10-CM | POA: Diagnosis not present

## 2017-08-20 DIAGNOSIS — E119 Type 2 diabetes mellitus without complications: Secondary | ICD-10-CM | POA: Diagnosis not present

## 2017-08-20 DIAGNOSIS — H524 Presbyopia: Secondary | ICD-10-CM | POA: Diagnosis not present

## 2017-08-20 DIAGNOSIS — H52223 Regular astigmatism, bilateral: Secondary | ICD-10-CM | POA: Diagnosis not present

## 2017-08-24 ENCOUNTER — Ambulatory Visit (INDEPENDENT_AMBULATORY_CARE_PROVIDER_SITE_OTHER)
Admission: RE | Admit: 2017-08-24 | Discharge: 2017-08-24 | Disposition: A | Discharge status: Home / Self Care | Payer: BLUE CROSS/BLUE SHIELD | Source: Ambulatory Visit | Attending: Family Medicine | Admitting: Family Medicine

## 2017-08-24 ENCOUNTER — Ambulatory Visit: Payer: Self-pay | Admitting: Family Medicine

## 2017-08-24 ENCOUNTER — Encounter: Payer: Self-pay | Admitting: Family Medicine

## 2017-08-24 ENCOUNTER — Ambulatory Visit (INDEPENDENT_AMBULATORY_CARE_PROVIDER_SITE_OTHER): Payer: BLUE CROSS/BLUE SHIELD | Admitting: Family Medicine

## 2017-08-24 VITALS — BP 140/80 | HR 80 | Temp 98.8°F | Ht 70.0 in | Wt 261.2 lb

## 2017-08-24 DIAGNOSIS — M7501 Adhesive capsulitis of right shoulder: Secondary | ICD-10-CM | POA: Diagnosis not present

## 2017-08-24 DIAGNOSIS — E11618 Type 2 diabetes mellitus with other diabetic arthropathy: Secondary | ICD-10-CM

## 2017-08-24 DIAGNOSIS — M19011 Primary osteoarthritis, right shoulder: Secondary | ICD-10-CM | POA: Diagnosis not present

## 2017-08-24 NOTE — Progress Notes (Signed)
Dr. Frederico Hamman T. Nera Haworth, MD, [facility] [place], [address] Phone: [phone] Fax: [phone]  08/24/2017  Patient: Joel Bennett, MRN: [id number], DOB: Jun 15, 1965, 52 y.o.  Primary Physician:  Ria Bush, MD   Chief Complaint  Patient presents with  . Shoulder Pain    Bilateral x 1 month   Subjective:   Jaris Kohles is a 52 y.o. very pleasant male patient who presents with the following: shoulder pain  The patient noted above presents with shoulder pain that has been ongoing for 3-4 weeks.  there is no history of trauma or accident. The patient denies neck pain or radicular symptoms. No shoulder blade pain Denies dislocation, subluxation, separation of the shoulder. The patient does complain of pain with flexion, abduction, and terminal motion.  Significant restriction of motion. he describes a deep ache around the shoulder, and sometimes it will wake the patient up at night.  B shoulder pain, 3-4 - nothing out of the ordinary.   L shoulder h/o shoulder surgery. Arthroscopic surgery. Dr. Tonita Cong.  Medications Tried: NSAIDS, Tylenol Ice or Heat: minimal help Tried PT: No  Prior shoulder Injury: No Prior surgery: L, as above Prior fracture: No  Past Medical History, Surgical History, Social History, Family History, Medications, and allergies reviewed and updated if relevant.   Patient Active Problem List   Diagnosis Date Noted  . Anxiety 10/08/2015  . Chest pain 07/20/2015  . Facial paresthesia 12/26/2014  . Acute headache 12/26/2014  . 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 Abnormal sensation in left ear 01/23/2014  . URI (upper respiratory infection) 12/15/2013  . Encounter for well adult exam with abnormal findings 02/25/2013  . Loose stools 01/24/2013  . Gout   . Allergic rhinitis   . DDD (degenerative disc disease), lumbar   . Hypertension   . Diabetes mellitus type 2,  uncontrolled, without complications ([facility])     Past Medical History:  Diagnosis Date  . Allergic rhinitis   . Allergy   . DDD (degenerative disc disease), lumbar   . Diabetes mellitus without complication ([place])   . Diabetes type 2, uncontrolled ([place]) 2012  . Gout   . History of depression 2000   treated with meds  . Hypertension 1994  . Stool incontinence 12/2011   neg giardia, neg O&P, neg iFOB from prior PCP    Past Surgical History:  Procedure Laterality Date  . ANKLE SURGERY Left 1999  . BACK SURGERY  1997   Lumbar  . CARDIOVASCULAR STRESS TEST  08/2015   WNL (Dr Oval Linsey)  . COLONOSCOPY  05/2014   TA several, rpt 3 yrs Ardis Hughs)  . KNEE SURGERY Right 1985  . SHOULDER SURGERY Left 2002   x 2    Social History   Socioeconomic History  . Marital status: Married    Spouse name: Not on file  . Number of children: Not on file  . Years of education: Not on file  . Highest education level: Not on file  Occupational History  . Not on file  Social Needs  . Financial resource strain: Not on file  . Food insecurity:    Worry: Not on file    Inability: Not on file  . Transportation needs:    Medical: Not on file    Non-medical: Not on file  Tobacco Use  . Smoking status: Never Smoker  . Smokeless tobacco: Current User    Types: Snuff  Substance and Sexual  Activity  . Alcohol use: Yes    Alcohol/week: 0.0 oz    Comment: occasional  . Drug use: No  . Sexual activity: Yes  Lifestyle  . Physical activity:    Days per week: Not on file    Minutes per session: Not on file  . Stress: Not on file  Relationships  . Social connections:    Talks on phone: Not on file    Gets together: Not on file    Attends religious service: Not on file    Active member of club or organization: Not on file    Attends meetings of clubs or organizations: Not on file    Relationship status: Not on file  . Intimate partner violence:    Fear of current or ex partner: Not on file     Emotionally abused: Not on file    Physically abused: Not on file    Forced sexual activity: Not on file  Other Topics Concern  . Not on file  Social History Narrative   Lives with wife   Grown children   Occ: Database administrator   Edu: HS   Activity: no regular exercise   Diet: good water, fruits/vegetables daily, red meat regularly    Family History  Problem Relation Age of Onset  . Cancer Maternal Aunt        lung (smoker)  . Rectal cancer Maternal Aunt   . Cancer Maternal Aunt        rectal  . Cancer Mother        lung (smoker)  . Cancer Paternal Grandmother        lung (smoker)  . CAD Paternal Grandmother        MI  . Cancer Other        paternal great uncle unsure type  . Alcohol abuse Paternal Grandfather   . Alcohol abuse Maternal Grandfather   . Alcohol abuse Paternal Uncle        and drugs  . CAD Paternal Uncle        MI  . CAD Paternal Aunt 75       MI  . Stroke Maternal Grandmother   . Stroke Cousin   . Diabetes Father   . Atrial fibrillation Father   . Diabetes Cousin   . Colon polyps Neg Hx   . Esophageal cancer Neg Hx   . Stomach cancer Neg Hx     No Known Allergies  Medication list reviewed and updated in full in [place].  GEN: No fevers, chills. Nontoxic. Primarily MSK c/o today. MSK: Detailed in the HPI GI: tolerating PO intake without difficulty Neuro: No numbness, parasthesias, or tingling associated. Otherwise the pertinent positives of the ROS are noted above.    Objective:   Blood pressure 140/80, pulse 80, temperature 98.8 F (37.1 C), temperature source Oral, height 5\' 10"  (1.778 m), weight 261 lb 4 oz (118.5 kg).   GEN: WDWN, NAD, Non-toxic, Alert & Oriented x 3 HEENT: Atraumatic, Normocephalic.  Ears and Nose: No external deformity. EXTR: No clubbing/cyanosis/edema NEURO: Normal gait.  PSYCH: Normally interactive. Conversant. Not depressed or anxious appearing.  Calm demeanor.   Shoulder: R and L Inspection: No  muscle wasting or winging Ecchymosis/edema: neg  AC joint, scapula, clavicle: NT Cervical spine: NT, full ROM Spurling's: neg ABNORMAL SIDE TESTED: B UNLESS OTHERWISE NOTED, THE CONTRALATERAL SIDE HAS FULL RANGE OF MOTION. Abduction: 5/5, LIMITED TO 165 DEGREES Flexion: 5/5, LIMITED TO 165 DEGNO ROM  IR,  lift-off: 5/5. 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 Emphasized importance of adherence, help of PT, daily HEP.  The average length of total symptoms is 12-18 months going through 3 different phases in the freezing and thawing process. Reviewed all with patient.   Tylenol or NSAID of choice prn for pain relief Intraarticular shoulder injections discussed with patient, which have good evidence for accelerating the thawing phase, but he declines for now.   Patient will be sent for formal PT for aggressive frozen shoulder ROM, but he declines as of now.  Will need RTC str and scapular stabilization to fix underlying mechanics.  Follow-up: Return in about 2 months (around 10/24/2017).  Orders Placed This Encounter  Procedures  . DG Shoulder Right    Signed,  Frederico Hamman T. Shaakira Borrero, MD   Patient's Medications  New Prescriptions   No medications on file  Previous Medications   GLUCOSE BLOOD (FREESTYLE LITE) TEST STRIP    Use as instructed to check sugar 1-2 times  daily as needed. Dx: E11.65   INDOMETHACIN (INDOCIN) 50 MG CAPSULE    TAKE 1 CAPSULE (50 MG TOTAL) BY MOUTH 3 (THREE) TIMES DAILY WITH MEALS.   LOSARTAN (COZAAR) 100 MG TABLET    Take 1 tablet (100 mg total) by mouth daily.   METFORMIN (GLUCOPHAGE XR) 500 MG 24 HR TABLET    Take 2 tablets (1,000 mg total) by mouth daily with breakfast.  Modified Medications   No medications on file  Discontinued Medications   GUAIFENESIN-CODEINE (CHERATUSSIN AC) 100-10 MG/5ML SYRUP    Take 5 mLs by mouth 2 (two) times daily as needed.

## 2017-12-03 ENCOUNTER — Ambulatory Visit (INDEPENDENT_AMBULATORY_CARE_PROVIDER_SITE_OTHER)
Admission: RE | Admit: 2017-12-03 | Discharge: 2017-12-03 | Disposition: A | Discharge status: Home / Self Care | Payer: Self-pay | Source: Ambulatory Visit | Attending: Family Medicine | Admitting: Family Medicine

## 2017-12-03 ENCOUNTER — Encounter: Payer: Self-pay | Admitting: Family Medicine

## 2017-12-03 ENCOUNTER — Ambulatory Visit (INDEPENDENT_AMBULATORY_CARE_PROVIDER_SITE_OTHER): Payer: Self-pay | Admitting: Family Medicine

## 2017-12-03 VITALS — BP 150/100 | HR 87 | Temp 98.6°F | Ht 70.0 in | Wt 251.5 lb

## 2017-12-03 DIAGNOSIS — M2550 Pain in unspecified joint: Secondary | ICD-10-CM

## 2017-12-03 DIAGNOSIS — M25439 Effusion, unspecified wrist: Secondary | ICD-10-CM

## 2017-12-03 DIAGNOSIS — M1000 Idiopathic gout, unspecified site: Secondary | ICD-10-CM

## 2017-12-03 DIAGNOSIS — M659 Unspecified synovitis and tenosynovitis, unspecified site: Secondary | ICD-10-CM

## 2017-12-03 NOTE — Progress Notes (Signed)
Dr. Frederico Hamman T. Gia Lusher, MD, [facility] Medicine [place], [address] Phone: [phone] Fax: [phone]  12/03/2017  Patient: Joel Bennett, MRN: [id number], DOB: 1965-11-02, 52 y.o.  Primary Physician:  Ria Bush, MD   Chief Complaint  Patient presents with  . Hand Pain    Bilateral   Subjective:   Joel Bennett is a 52 y.o. very pleasant male patient who presents with the following:  B hands are swollen are all over a couple of months. Swelling of knuckles worsened - never had anything like this before, shoulders, back also.  I have seen him previously for adhesive capsulitis, and his shoulders are starting to loosen up and feel better.  Globally he has pain particularly pain in his hands and knuckles that never goes away and is present 24 hours a day.  He has diffuse swelling in his primarily MCP joints, and to a lesser extent his PIP and DIP joints as well as in the true wrist.  He has not had any trauma or accident.  He denies dry mouth, sicca symptoms, rashes.  He does have a history of gout in the past. I do not see crystal confirmation.  + gout Sister, ? GM with RA No psoriasis No sjogrens or scleroderma.  No AS or Reiter's syndrome.  Past Medical History, Surgical History, Social History, Family History, Problem List, Medications, and Allergies have been reviewed and updated if relevant.  Patient Active Problem List   Diagnosis Date Noted  . Anxiety 10/08/2015  . Chest pain 07/20/2015  . Facial paresthesia 12/26/2014  . Acute headache 12/26/2014  . 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 Abnormal sensation in left ear 01/23/2014  . URI (upper respiratory infection) 12/15/2013  . Encounter for well adult exam with abnormal findings 02/25/2013  . Loose stools 01/24/2013  . Gout   . Allergic rhinitis   . DDD (degenerative disc disease), lumbar   . Hypertension   . Diabetes mellitus type  2, uncontrolled, without complications (HCC)     Past Medical History:  Diagnosis Date  . Allergic rhinitis   . Allergy   . DDD (degenerative disc disease), lumbar   . Diabetes mellitus without complication ([place])   . Diabetes type 2, uncontrolled ([place]) 2012  . Gout   . History of depression 2000   treated with meds  . Hypertension 1994  . Stool incontinence 12/2011   neg giardia, neg O&P, neg iFOB from prior PCP    Past Surgical History:  Procedure Laterality Date  . ANKLE SURGERY Left 1999  . BACK SURGERY  1997   Lumbar  . CARDIOVASCULAR STRESS TEST  08/2015   WNL (Dr Oval Linsey)  . COLONOSCOPY  05/2014   TA several, rpt 3 yrs Ardis Hughs)  . KNEE SURGERY Right 1985  . SHOULDER SURGERY Left 2002   x 2    Social History   Socioeconomic History  . Marital status: Married    Spouse name: Not on file  . Number of children: Not on file  . Years of education: Not on file  . Highest education level: Not on file  Occupational History  . Not on file  Social Needs  . Financial resource strain: Not on file  . Food insecurity:    Worry: Not on file    Inability: Not on file  . Transportation needs:    Medical: Not on file    Non-medical: Not on file  Tobacco Use  .  Smoking status: Never Smoker  . Smokeless tobacco: Current User    Types: Snuff  Substance and Sexual Activity  . Alcohol use: Yes    Alcohol/week: 0.0 standard drinks    Comment: occasional  . Drug use: No  . Sexual activity: Yes  Lifestyle  . Physical activity:    Days per week: Not on file    Minutes per session: Not on file  . Stress: Not on file  Relationships  . Social connections:    Talks on phone: Not on file    Gets together: Not on file    Attends religious service: Not on file    Active member of club or organization: Not on file    Attends meetings of clubs or organizations: Not on file    Relationship status: Not on file  . Intimate partner violence:    Fear of current or ex partner: Not  on file    Emotionally abused: Not on file    Physically abused: Not on file    Forced sexual activity: Not on file  Other Topics Concern  . Not on file  Social History Narrative   Lives with wife   Grown children   Occ: Database administrator   Edu: HS   Activity: no regular exercise   Diet: good water, fruits/vegetables daily, red meat regularly    Family History  Problem Relation Age of Onset  . Cancer Maternal Aunt        lung (smoker)  . Rectal cancer Maternal Aunt   . Cancer Maternal Aunt        rectal  . Cancer Mother        lung (smoker)  . Cancer Paternal Grandmother        lung (smoker)  . CAD Paternal Grandmother        MI  . Cancer Other        paternal great uncle unsure type  . Alcohol abuse Paternal Grandfather   . Alcohol abuse Maternal Grandfather   . Alcohol abuse Paternal Uncle        and drugs  . CAD Paternal Uncle        MI  . CAD Paternal Aunt 61       MI  . Stroke Maternal Grandmother   . Stroke Cousin   . Diabetes Father   . Atrial fibrillation Father   . Diabetes Cousin   . Colon polyps Neg Hx   . Esophageal cancer Neg Hx   . Stomach cancer Neg Hx     No Known Allergies  Medication list reviewed and updated in full in [place].  GEN: No fevers, chills. Nontoxic. Primarily MSK c/o today. MSK: Detailed in the HPI GI: tolerating PO intake without difficulty Neuro: No numbness, parasthesias, or tingling associated. Otherwise the pertinent positives of the ROS are noted above.   Objective:   BP (!) 150/100   Pulse 87   Temp 98.6 F (37 C) (Oral)   Ht 5\' 10"  (1.778 m)   Wt 251 lb 8 oz (114.1 kg)   BMI 36.09 kg/m    GEN: WDWN, NAD, Non-toxic, Alert & Oriented x 3 HEENT: Atraumatic, Normocephalic.  Ears and Nose: No external deformity. EXTR: No clubbing/cyanosis/edema NEURO: Normal gait.  PSYCH: Normally interactive. Conversant. Not depressed or anxious appearing.  Calm demeanor.    Full range of motion at the  elbows.  The patient does have dorsal fullness in both wrists, and his range of motion has  decreased by approximately 35% in all directions.  He is unable to make a complete composite fist with both hands.  There is swelling in all MCPs and most PIP joints.  There is no redness or warmth, but there is tenderness to palpation and swelling.  Particularly in the MCP joints there appears to be synovitis.  Nontender along the length of the ulna and radius.  Nontender along the length of the humerus.  Nontender at the [facility] joint and on the clavicle.  He still has some residual loss of motion with abduction in the shoulder.  Strength is 5/5 at the shoulder.  I took a picture of his hands to place in the chart but ran into technical difficulties.   Radiology: pending  Assessment and Plan:   Polyarthralgia - Plan: Sedimentation rate, High sensitivity CRP, Cyclic citrul peptide antibody, IgG, Rheumatoid factor, ANA Screen,IFA,Reflex Titer/Pattern,Reflex Mplx 11 Ab Cascade with IdentRA, Uric acid, Anti-DNA antibody, double-stranded, Anti-Smith antibody, DG Hand Complete Left, DG Hand Complete Right  Synovitis - Plan: Sedimentation rate, High sensitivity CRP, Cyclic citrul peptide antibody, IgG, Rheumatoid factor, ANA Screen,IFA,Reflex Titer/Pattern,Reflex Mplx 11 Ab Cascade with IdentRA, Uric acid, Anti-DNA antibody, double-stranded, Anti-Smith antibody, DG Hand Complete Left, DG Hand Complete Right  Wrist swelling, unspecified laterality - Plan: Sedimentation rate, High sensitivity CRP, Cyclic citrul peptide antibody, IgG, Rheumatoid factor, ANA Screen,IFA,Reflex Titer/Pattern,Reflex Mplx 11 Ab Cascade with IdentRA, Uric acid, Anti-DNA antibody, double-stranded, Anti-Smith antibody, DG Hand Complete Left, DG Hand Complete Right  Idiopathic gout, unspecified chronicity, unspecified site - Plan: Uric acid  Given his overall history and examination, I am concerned that the patient may have an underlying  rheumatological condition, so I am going to do a rheumatological work-up as well as bilateral hand films.  Findings from this work-up will dictate further plan of care.  Follow-up: No follow-ups on file.  Orders Placed This Encounter  Procedures  . DG Hand Complete Left  . DG Hand Complete Right  . Sedimentation rate  . High sensitivity CRP  . Cyclic citrul peptide antibody, IgG  . Rheumatoid factor  . ANA Screen,IFA,Reflex Titer/Pattern,Reflex Mplx 11 Ab Cascade with IdentRA  . Uric acid  . Anti-DNA antibody, double-stranded  . Anti-Smith antibody    Hoyle Barr T. Marifer Hurd, MD   Allergies as of 12/03/2017   No Known Allergies     Medication List        Accurate as of 12/03/17 11:59 PM. Always use your most recent med list.          glucose blood test strip Use as instructed to check sugar 1-2 times daily as needed. Dx: E11.65   indomethacin 50 MG capsule Commonly known as:  INDOCIN TAKE 1 CAPSULE (50 MG TOTAL) BY MOUTH 3 (THREE) TIMES DAILY WITH MEALS.   losartan 100 MG tablet Commonly known as:  COZAAR Take 1 tablet (100 mg total) by mouth daily.   metFORMIN 500 MG 24 hr tablet Commonly known as:  GLUCOPHAGE-XR Take 2 tablets (1,000 mg total) by mouth daily with breakfast.

## 2017-12-04 LAB — URIC ACID: Uric Acid, Serum: 4.6 mg/dL (ref 4.0–7.8)

## 2017-12-04 LAB — HIGH SENSITIVITY CRP: CRP, High Sensitivity: 30.45 mg/L — ABNORMAL HIGH (ref 0.000–5.000)

## 2017-12-04 LAB — SEDIMENTATION RATE: Sed Rate: 60 mm/hr — ABNORMAL HIGH (ref 0–20)

## 2017-12-07 ENCOUNTER — Encounter: Payer: Self-pay | Admitting: Family Medicine

## 2017-12-08 MED ORDER — PREDNISONE 5 MG PO TABS: 15.0000 mg | ORAL_TABLET | Freq: Every day | ORAL | 0 refills | Status: DC

## 2017-12-08 NOTE — Addendum Note (Signed)
Addended by: Owens Loffler on: 12/08/2017 08:30 AM   Modules accepted: Orders

## 2017-12-09 LAB — ANA SCREEN,IFA,REFLEX TITER/PATTERN,REFLEX MPLX 11 AB CASCADE
14-3-3 eta Protein: <0.2 ng/mL (ref <0.2)
Anti Nuclear Antibody(ANA): NEGATIVE
Cyclic Citrullin Peptide Ab: <16 UNITS
Rhuematoid fact SerPl-aCnc: <14 IU/mL (ref <14)

## 2017-12-09 LAB — ANTI-DNA ANTIBODY, DOUBLE-STRANDED: ds DNA Ab: 1 IU/mL

## 2017-12-09 LAB — ANTI-SMITH ANTIBODY: ENA SM Ab Ser-aCnc: <1 AI

## 2017-12-11 ENCOUNTER — Encounter: Payer: Self-pay | Admitting: Family Medicine

## 2017-12-11 NOTE — Telephone Encounter (Signed)
Left message for Joel Bennett that we are still waiting on some test results.  But basically Dr. Lorelei Pont thinks there is something going on rheumatological but won't know for sure until all lab results are back.  I stated that I hope Joel Bennett did pick up the prescription for prednisone to take and once all the labs results are back, Dr. Lorelei Pont will be in touch with treatment plan.

## 2017-12-11 NOTE — Telephone Encounter (Signed)
There should not be confusion. I spoke to him for 20 minutes on the phone yesterday.   I will send him a message again.

## 2017-12-11 NOTE — Telephone Encounter (Signed)
Copied from [place] [id number]. Topic: General - Other >> Dec 11, 2017  3:08 PM Yvette Rack wrote: Reason for CRM: pt wife Mordecai Rasmussen calling about the e-mail she states that its to complicated to understand  Dr Lorelei Pont sent them an e-mail on the 17th about his hands please give her a call at [phone]

## 2017-12-11 NOTE — Telephone Encounter (Signed)
Please see result note on 12/03/17 and pt message on 12/07/17.

## 2018-01-06 ENCOUNTER — Other Ambulatory Visit: Payer: Self-pay | Admitting: Family Medicine

## 2018-01-06 MED ORDER — PREDNISONE 5 MG PO TABS: 15.0000 mg | ORAL_TABLET | Freq: Every day | ORAL | 4 refills | Status: DC

## 2018-01-06 NOTE — Telephone Encounter (Signed)
Last office visit 12/03/2017 with Dr. Lorelei Pont for polyarthralgia.  Next office visit 01/14/2018 with Dr. Lorelei Pont.  Last refilled 12/08/2017 for #90 with no refills.

## 2018-01-14 ENCOUNTER — Ambulatory Visit: Payer: Self-pay | Admitting: Family Medicine

## 2018-02-27 ENCOUNTER — Other Ambulatory Visit: Payer: Self-pay | Admitting: Family Medicine

## 2018-04-04 NOTE — Progress Notes (Signed)
Dr. Frederico Hamman T. Ottilia Pippenger, MD, [facility] Medicine [place], [address] Phone: [phone] Fax: [phone]  04/08/2018  Patient: Joel Bennett, MRN: [id number], DOB: [birth year], 53 y.o.  Primary Physician:  Ria Bush, MD   Chief Complaint  Patient presents with  . Follow-up    Shoulders and Hands   Subjective:   Joel Bennett is a 53 y.o. very pleasant male patient who presents with the following:  I saw the patient last 12/03/2017, and at that point we did a large-scale Rheumatological work-up which was negative except for quite high ESR and CRP.  At that point, I thought that he had PR and placed him on prednisone 15 mg per day.  I also saw him in 08/2017 and he had a frozen shoulder, where he declined formal PT and he also declined an intraarticular injection. I sent him with [facility].  Within a couple of days of pred 15 mg he got remarkably better.   His shoulders and his hands feel remarkably better, and he is approaching being asymptomatic with some mild discomfort only.  He is not having any significant swelling in his hands or wrists.  Shoulders have full range of motion.  Past Medical History, Surgical History, Social History, Family History, Problem List, Medications, and Allergies have been reviewed and updated if relevant.  Patient Active Problem List   Diagnosis Date Noted  . Anxiety 10/08/2015  . Chest pain 07/20/2015  . Facial paresthesia 12/26/2014  . Acute headache 12/26/2014  . 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 Abnormal sensation in left ear 01/23/2014  . URI (upper respiratory infection) 12/15/2013  . Encounter for well adult exam with abnormal findings 02/25/2013  . Loose stools 01/24/2013  . Gout   . Allergic rhinitis   . DDD (degenerative disc disease), lumbar   . Hypertension   . Diabetes mellitus type 2, uncontrolled, without complications (HCC)     Past Medical  History:  Diagnosis Date  . Allergic rhinitis   . Allergy   . DDD (degenerative disc disease), lumbar   . Diabetes mellitus without complication ([place])   . Diabetes type 2, uncontrolled ([place]) 2012  . Gout   . History of depression 2000   treated with meds  . Hypertension 1994  . Stool incontinence 12/2011   neg giardia, neg O&P, neg iFOB from prior PCP    Past Surgical History:  Procedure Laterality Date  . ANKLE SURGERY Left 1999  . BACK SURGERY  1997   Lumbar  . CARDIOVASCULAR STRESS TEST  08/2015   WNL (Dr Oval Linsey)  . COLONOSCOPY  05/2014   TA several, rpt 3 yrs Ardis Hughs)  . KNEE SURGERY Right 1985  . SHOULDER SURGERY Left 2002   x 2    Social History   Socioeconomic History  . Marital status: Married    Spouse name: Not on file  . Number of children: Not on file  . Years of education: Not on file  . Highest education level: Not on file  Occupational History  . Not on file  Social Needs  . Financial resource strain: Not on file  . Food insecurity:    Worry: Not on file    Inability: Not on file  . Transportation needs:    Medical: Not on file    Non-medical: Not on file  Tobacco Use  . Smoking status: Never Smoker  . Smokeless tobacco: Current User    Types: Snuff  Substance and Sexual Activity  . Alcohol use: Yes    Alcohol/week: 0.0 standard drinks    Comment: occasional  . Drug use: No  . Sexual activity: Yes  Lifestyle  . Physical activity:    Days per week: Not on file    Minutes per session: Not on file  . Stress: Not on file  Relationships  . Social connections:    Talks on phone: Not on file    Gets together: Not on file    Attends religious service: Not on file    Active member of club or organization: Not on file    Attends meetings of clubs or organizations: Not on file    Relationship status: Not on file  . Intimate partner violence:    Fear of current or ex partner: Not on file    Emotionally abused: Not on file    Physically  abused: Not on file    Forced sexual activity: Not on file  Other Topics Concern  . Not on file  Social History Narrative   Lives with wife   Grown children   Occ: Database administrator   Edu: HS   Activity: no regular exercise   Diet: good water, fruits/vegetables daily, red meat regularly    Family History  Problem Relation Age of Onset  . Cancer Maternal Aunt        lung (smoker)  . Rectal cancer Maternal Aunt   . Cancer Maternal Aunt        rectal  . Cancer Mother        lung (smoker)  . Cancer Paternal Grandmother        lung (smoker)  . CAD Paternal Grandmother        MI  . Cancer Other        paternal great uncle unsure type  . Alcohol abuse Paternal Grandfather   . Alcohol abuse Maternal Grandfather   . Alcohol abuse Paternal Uncle        and drugs  . CAD Paternal Uncle        MI  . CAD Paternal Aunt 79       MI  . Stroke Maternal Grandmother   . Stroke Cousin   . Diabetes Father   . Atrial fibrillation Father   . Diabetes Cousin   . Colon polyps Neg Hx   . Esophageal cancer Neg Hx   . Stomach cancer Neg Hx     No Known Allergies  Medication list reviewed and updated in full in [place].  GEN: No fevers, chills. Nontoxic. Primarily MSK c/o today. MSK: Detailed in the HPI GI: tolerating PO intake without difficulty Neuro: No numbness, parasthesias, or tingling associated. Otherwise the pertinent positives of the ROS are noted above.   Objective:   BP 104/74   Pulse 69   Temp 98.5 F (36.9 C) (Oral)   Ht '5\' 10"'$  (1.778 m)   Wt 255 lb 8 oz (115.9 kg)   BMI 36.66 kg/m    GEN: WDWN, NAD, Non-toxic, Alert & Oriented x 3 HEENT: Atraumatic, Normocephalic.  Ears and Nose: No external deformity. EXTR: No clubbing/cyanosis/edema NEURO: Normal gait.  PSYCH: Normally interactive. Conversant. Not depressed or anxious appearing.  Calm demeanor.   Shoulder: B Inspection: No muscle wasting or winging Ecchymosis/edema: neg  AC joint, scapula,  clavicle: NT Cervical spine: NT, full ROM Spurling's: neg Abduction: full, 5/5 Flexion: full, 5/5 IR, full, lift-off: 5/5 ER at neutral: full, 5/5 AC crossover and  compression: neg Neer: neg Hawkins: neg Drop Test: neg Empty Can: neg Supraspinatus insertion: NT Bicipital groove: NT Speed's: neg Yergason's: neg Sulcus sign: neg Scapular dyskinesis: none C5-T1 intact Sensation intact Grip 5/5   Bilateral wrists have no synovitis.  The PIP joints and DIP joints have no significant synovitis, and he has full range of motion in all digits as well as the wrist bilaterally with a grip that is full and intact bilaterally.  Neurovascularly intact.  Grossly bony anatomy in the hands and wrist is nontender throughout.  Radiology: CLINICAL DATA:  Bilateral shoulder pain, right worse than left. Symptoms for 2 weeks. No known injury.  EXAM: RIGHT SHOULDER - 2+ VIEW  COMPARISON:  None.  FINDINGS: No acute bony or joint abnormality is seen. Mild acromioclavicular osteoarthritis is noted. No focal bony lesion. Imaged lung parenchyma and ribs appear normal.  IMPRESSION: Mild acromioclavicular osteoarthritis.  Otherwise negative.   Electronically Signed   By: Inge Rise M.D.   On: 08/24/2017 09:27  CLINICAL DATA:  Bilateral hand pain and swelling X102month. No injury.  EXAM: RIGHT HAND - COMPLETE 3+ VIEW  COMPARISON:  None.  FINDINGS: No acute or suspicious osseous finding. Degenerative changes noted at the first [facility], mild to moderate in degree, with associated joint space narrowing, articular surface sclerosis and radial subluxation of the first metacarpal bone. No additional significant degenerative change. Soft tissues about the RIGHT hand are unremarkable.  IMPRESSION: 1. No acute findings. 2. 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   Electronically Signed   By: SFranki CabotM.D.   On: 12/04/2017 08:28  CLINICAL DATA:  Bilateral hand pain and swelling X246month No injury.  EXAM: LEFT HAND - COMPLETE 3+ VIEW  COMPARISON:  Plain film of the LEFT hand dated 01/23/2014.  FINDINGS: Interval plate and screw fixation of the first metacarpal bone and trapezoid, with expected/associated osseous fusion at the first [facility]. Hardware appears intact and appropriately position.  Osseous structures are otherwise normally aligned throughout. No acute or suspicious osseous lesion. No significant degenerative change. No erosions or other signs of an inflammatory arthritis. Soft tissues about the LEFT hand are unremarkable.  IMPRESSION: 1. No acute findings. 2. Fixation hardware traversing the first CMT[place]maoint. Hardware appears intact and appropriately positioned. No evidence of surgical complicating feature.   Electronically Signed   By: StFranki Cabot.D.   On: 12/04/2017 08:26  Results for orders placed or performed in visit on 12/03/17  Sedimentation rate  Result Value Ref Range   Sed Rate 60 (H) 0 - 20 mm/hr  High sensitivity CRP  Result Value Ref Range   CRP, High Sensitivity 30.450 (H) 0.000 - 5.000 mg/L  ANA Screen,IFA,Reflex Titer/Pattern,Reflex Mplx 11 Ab Cascade with IdentRA  Result Value Ref Range   Anti Nuclear Antibody(ANA) NEGATIVE NEGATIVE   Rhuematoid fact SerPl-aCnc <1<011<75U/mL   Cyclic Citrullin Peptide Ab <16 UNITS   14-3-3 eta Protein <0.2 <0.2 ng/mL  Uric acid  Result Value Ref Range   Uric Acid, Serum 4.6 4.0 - 7.8 mg/dL  Anti-DNA antibody, double-stranded  Result Value Ref Range   ds DNA Ab 1 IU/mL  Anti-Smith antibody  Result Value Ref Range   ENA SM Ab Ser-aCnc <1.0 NEG <1.0 NEG AI     Assessment and Plan:   Polymyalgia rheumatica (HCC)  Adhesive capsulitis of right shoulder associated with type  2 diabetes mellitus ([place])  With a sed rate of  60 and a CRP of 30 and a otherwise normal rheumatological work-up and immediate response to prednisone of 15 mg, he has polymyalgia rheumatica.  I am going to continue him on prednisone 15 mg for an entirety of 6 months and then taper down.  Patient Instructions  Starting in June 06, 2018 - I want you cut your prednisone dose down to 10 mg a day (2 tablets a day).  We will see how you do with the lower dose and have to slowly drop things over time.     Follow-up: Return in about 11 weeks (around 06/24/2018) for recheck.  Signed,  Maud Deed. Pranay Hilbun, MD   Outpatient Encounter Medications as of 04/08/2018  Medication Sig  . glucose blood (FREESTYLE LITE) test strip Use as instructed to check sugar 1-2 times daily as needed. Dx: E11.65  . indomethacin (INDOCIN) 50 MG capsule TAKE 1 CAPSULE (50 MG TOTAL) BY MOUTH 3 (THREE) TIMES DAILY WITH MEALS.  Mar[place] Kitchen losartan (COZAAR) 100 MG tablet TAKE 1 TABLET BY MOUTH EVERY DAY  . metFORMIN (GLUCOPHAGE-XR) 500 MG 24 hr tablet TAKE 2 TABLETS BY MOUTH EVERY DAY WITH BREAKFAST  . predniSONE (DELTASONE) 5 MG tablet Take 3 tablets (15 mg total) by mouth daily with breakfast.   No facility-administered encounter medications on file as of 04/08/2018.

## 2018-04-08 ENCOUNTER — Ambulatory Visit: Payer: BLUE CROSS/BLUE SHIELD | Admitting: Family Medicine

## 2018-04-08 ENCOUNTER — Encounter: Payer: Self-pay | Admitting: Family Medicine

## 2018-04-08 VITALS — BP 104/74 | HR 69 | Temp 98.5°F | Ht 70.0 in | Wt 255.5 lb

## 2018-04-08 DIAGNOSIS — E11618 Type 2 diabetes mellitus with other diabetic arthropathy: Secondary | ICD-10-CM | POA: Diagnosis not present

## 2018-04-08 DIAGNOSIS — M7501 Adhesive capsulitis of right shoulder: Secondary | ICD-10-CM | POA: Diagnosis not present

## 2018-04-08 DIAGNOSIS — M353 Polymyalgia rheumatica: Secondary | ICD-10-CM | POA: Diagnosis not present

## 2018-04-08 NOTE — Patient Instructions (Signed)
Starting in June 06, 2018 - I want you cut your prednisone dose down to 10 mg a day (2 tablets a day).  We will see how you do with the lower dose and have to slowly drop things over time.

## 2018-05-15 ENCOUNTER — Encounter: Payer: Self-pay | Admitting: Family Medicine

## 2018-05-17 NOTE — Telephone Encounter (Signed)
Does patient need an appointment to discuss?

## 2018-06-03 ENCOUNTER — Telehealth: Payer: Self-pay | Admitting: Family Medicine

## 2018-06-03 ENCOUNTER — Other Ambulatory Visit: Payer: Self-pay | Admitting: Family Medicine

## 2018-06-04 MED ORDER — LOSARTAN POTASSIUM 100 MG PO TABS: 100.0000 mg | ORAL_TABLET | Freq: Every day | ORAL | 0 refills | Status: DC

## 2018-06-04 NOTE — Telephone Encounter (Signed)
E-scribed losartan refill.  Pls schedule annual CPE.

## 2018-06-04 NOTE — Telephone Encounter (Signed)
Appointment 8/6  Pt wanted early appointment offered sooner appointment pt declined wanted early am appoinment

## 2018-06-04 NOTE — Telephone Encounter (Signed)
Noted  

## 2018-06-05 ENCOUNTER — Other Ambulatory Visit: Payer: Self-pay | Admitting: Family Medicine

## 2018-06-05 MED ORDER — METFORMIN HCL ER 500 MG PO TB24: 1000.0000 mg | ORAL_TABLET | Freq: Every day | ORAL | 0 refills | Status: DC

## 2018-06-07 MED ORDER — LOSARTAN POTASSIUM 100 MG PO TABS: 100.0000 mg | ORAL_TABLET | Freq: Every day | ORAL | 1 refills | Status: DC

## 2018-06-23 ENCOUNTER — Encounter: Payer: Self-pay | Admitting: Family Medicine

## 2018-06-23 ENCOUNTER — Ambulatory Visit: Payer: BLUE CROSS/BLUE SHIELD | Admitting: Family Medicine

## 2018-06-23 ENCOUNTER — Other Ambulatory Visit: Payer: Self-pay

## 2018-06-23 VITALS — BP 110/64 | HR 82 | Temp 98.5°F | Ht 70.0 in | Wt 257.8 lb

## 2018-06-23 DIAGNOSIS — M353 Polymyalgia rheumatica: Secondary | ICD-10-CM | POA: Diagnosis not present

## 2018-06-23 MED ORDER — PREDNISONE 1 MG PO TABS: 1.0000 mg | ORAL_TABLET | Freq: Every day | ORAL | 2 refills | Status: DC

## 2018-06-23 NOTE — Patient Instructions (Signed)
On 07/07/2018, decrease your prednisone 5 mg to 1 tablet a day.  On 08/06/2018, take 4 of the 1 mg tablets for 2 weeks until 08/20/2018 when you will drop to 3 tablets  On 09/03/2018, take 2 of the 1 mg tablets for 2 weeks until 09/17/2018, when you will drop to 1 tablet for 2 weeks. Until 10/01/2018.

## 2018-06-23 NOTE — Progress Notes (Signed)
Joel Krizan T. Evert Wenrich, MD Primary Care and Sports Medicine [facility] at [facility] [place], [address] Phone: [phone]  FAX: [phone]  Joel Bennett - 53 y.o. male  MRN [id number]  Date of Birth: 12-14-1965  Visit Date: 06/23/2018  PCP: Ria Bush, MD  Referred by: Ria Bush, MD  Chief Complaint  Patient presents with  . Follow-up    Hands   Subjective:   Joel Bennett is a 53 y.o. very pleasant male patient who presents with the following:  F/u Polymyalgia Rheumatica.  Now on Prednisone 15 mg daily.  3/15 dropped to 10 mg  He is doing fine, his synovitis and swelling in his wrist is decreased, as well as the pain in both of his shoulders.  Overall, he is doing well.  Past Medical History, Surgical History, Social History, Family History, Problem List, Medications, and Allergies have been reviewed and updated if relevant.  Patient Active Problem List   Diagnosis Date Noted  . Polymyalgia rheumatica ([place]) 04/08/2018  . Anxiety 10/08/2015  . Chest pain 07/20/2015  . Facial paresthesia 12/26/2014  . Acute headache 12/26/2014  . 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 Abnormal sensation in left ear 01/23/2014  . URI (upper respiratory infection) 12/15/2013  . Encounter for well adult exam with abnormal findings 02/25/2013  . Loose stools 01/24/2013  . Gout   . Allergic rhinitis   . DDD (degenerative disc disease), lumbar   . Hypertension   . Diabetes mellitus type 2, uncontrolled, without complications (HCC)     Past Medical History:  Diagnosis Date  . Allergic rhinitis   . Allergy   . DDD (degenerative disc disease), lumbar   . Diabetes mellitus without complication ([place])   . Diabetes type 2, uncontrolled ([place]) 2012  . Gout   . History of depression 2000   treated with meds  . Hypertension 1994  . Stool incontinence 12/2011   neg giardia, neg O&P, neg iFOB from prior PCP     Past Surgical History:  Procedure Laterality Date  . ANKLE SURGERY Left 1999  . BACK SURGERY  1997   Lumbar  . CARDIOVASCULAR STRESS TEST  08/2015   WNL (Dr Oval Linsey)  . COLONOSCOPY  05/2014   TA several, rpt 3 yrs Ardis Hughs)  . KNEE SURGERY Right 1985  . SHOULDER SURGERY Left 2002   x 2    Social History   Socioeconomic History  . Marital status: Married    Spouse name: Not on file  . Number of children: Not on file  . Years of education: Not on file  . Highest education level: Not on file  Occupational History  . Not on file  Social Needs  . Financial resource strain: Not on file  . Food insecurity:    Worry: Not on file    Inability: Not on file  . Transportation needs:    Medical: Not on file    Non-medical: Not on file  Tobacco Use  . Smoking status: Never Smoker  . Smokeless tobacco: Current User    Types: Snuff  Substance and Sexual Activity  . Alcohol use: Yes    Alcohol/week: 0.0 standard drinks    Comment: occasional  . Drug use: No  . Sexual activity: Yes  Lifestyle  . Physical activity:    Days per week: Not on file    Minutes per session: Not on file  . Stress: Not on file  Relationships  . Social  connections:    Talks on phone: Not on file    Gets together: Not on file    Attends religious service: Not on file    Active member of club or organization: Not on file    Attends meetings of clubs or organizations: Not on file    Relationship status: Not on file  . Intimate partner violence:    Fear of current or ex partner: Not on file    Emotionally abused: Not on file    Physically abused: Not on file    Forced sexual activity: Not on file  Other Topics Concern  . Not on file  Social History Narrative   Lives with wife   Grown children   Occ: Database administrator   Edu: HS   Activity: no regular exercise   Diet: good water, fruits/vegetables daily, red meat regularly    Family History  Problem Relation Age of Onset  . Cancer Maternal  Aunt        lung (smoker)  . Rectal cancer Maternal Aunt   . Cancer Maternal Aunt        rectal  . Cancer Mother        lung (smoker)  . Cancer Paternal Grandmother        lung (smoker)  . CAD Paternal Grandmother        MI  . Cancer Other        paternal great uncle unsure type  . Alcohol abuse Paternal Grandfather   . Alcohol abuse Maternal Grandfather   . Alcohol abuse Paternal Uncle        and drugs  . CAD Paternal Uncle        MI  . CAD Paternal Aunt 77       MI  . Stroke Maternal Grandmother   . Stroke Cousin   . Diabetes Father   . Atrial fibrillation Father   . Diabetes Cousin   . Colon polyps Neg Hx   . Esophageal cancer Neg Hx   . Stomach cancer Neg Hx     No Known Allergies  Medication list reviewed and updated in full in [place].  GEN: No fevers, chills. Nontoxic. Primarily MSK c/o today. MSK: Detailed in the HPI GI: tolerating PO intake without difficulty Neuro: No numbness, parasthesias, or tingling associated. Otherwise the pertinent positives of the ROS are noted above.   Objective:   BP 110/64   Pulse 82   Temp 98.5 F (36.9 C) (Oral)   Ht 5\' 10"  (1.778 m)   Wt 257 lb 12 oz (116.9 kg)   BMI 36.98 kg/m    GEN: WDWN, NAD, Non-toxic, Alert & Oriented x 3 HEENT: Atraumatic, Normocephalic.  Ears and Nose: No external deformity. EXTR: No clubbing/cyanosis/edema NEURO: Normal gait.  PSYCH: Normally interactive. Conversant. Not depressed or anxious appearing.  Calm demeanor.    There is no loss of motion in any joint including the shoulders or elbows as well as fingers and wrist.  He has no significant synovitis in any of the finger joints as well as wrist.  Strength is preserved in the upper extremities.  Radiology: No results found.  Assessment and Plan:   Polymyalgia rheumatica ([place])  Doing well, taper off meds  Patient Instructions  On 07/07/2018, decrease your prednisone 5 mg to 1 tablet a day.  On 08/06/2018, take 4  of the 1 mg tablets for 2 weeks until 08/20/2018 when you will drop to 3 tablets  On 09/03/2018, take  2 of the 1 mg tablets for 2 weeks until 09/17/2018, when you will drop to 1 tablet for 2 weeks. Until 10/01/2018.    Follow-up: No follow-ups on file.  Meds ordered this encounter  Medications  . predniSONE (DELTASONE) 1 MG tablet    Sig: Take 1 tablet (1 mg total) by mouth daily with breakfast.    Dispense:  120 tablet    Refill:  2   Signed,  Zonnique Norkus T. Josmar Messimer, MD   Outpatient Encounter Medications as of 06/23/2018  Medication Sig  . glucose blood (FREESTYLE LITE) test strip Use as instructed to check sugar 1-2 times daily as needed. Dx: E11.65  . indomethacin (INDOCIN) 50 MG capsule TAKE 1 CAPSULE (50 MG TOTAL) BY MOUTH 3 (THREE) TIMES DAILY WITH MEALS.  Marland Kitchen losartan (COZAAR) 100 MG tablet Take 1 tablet (100 mg total) by mouth daily. Needs annual physical appointment for additional refills  . metFORMIN (GLUCOPHAGE-XR) 500 MG 24 hr tablet Take 2 tablets (1,000 mg total) by mouth daily with breakfast.  . predniSONE (DELTASONE) 5 MG tablet Take 3 tablets (15 mg total) by mouth daily with breakfast.  . predniSONE (DELTASONE) 1 MG tablet Take 1 tablet (1 mg total) by mouth daily with breakfast.   No facility-administered encounter medications on file as of 06/23/2018.

## 2018-08-06 ENCOUNTER — Encounter: Payer: Self-pay | Admitting: Family Medicine

## 2018-08-09 ENCOUNTER — Encounter: Payer: Self-pay | Admitting: Family Medicine

## 2018-08-09 ENCOUNTER — Telehealth (INDEPENDENT_AMBULATORY_CARE_PROVIDER_SITE_OTHER): Payer: BLUE CROSS/BLUE SHIELD | Admitting: Family Medicine

## 2018-08-09 ENCOUNTER — Other Ambulatory Visit: Payer: BLUE CROSS/BLUE SHIELD

## 2018-08-09 VITALS — BP 118/72 | HR 72 | Ht 70.0 in

## 2018-08-09 DIAGNOSIS — N3941 Urge incontinence: Secondary | ICD-10-CM | POA: Insufficient documentation

## 2018-08-09 DIAGNOSIS — E1140 Type 2 diabetes mellitus with diabetic neuropathy, unspecified: Secondary | ICD-10-CM | POA: Diagnosis not present

## 2018-08-09 DIAGNOSIS — E1165 Type 2 diabetes mellitus with hyperglycemia: Secondary | ICD-10-CM | POA: Diagnosis not present

## 2018-08-09 DIAGNOSIS — R300 Dysuria: Secondary | ICD-10-CM

## 2018-08-09 DIAGNOSIS — M353 Polymyalgia rheumatica: Secondary | ICD-10-CM

## 2018-08-09 DIAGNOSIS — IMO0002 Reserved for concepts with insufficient information to code with codable children: Secondary | ICD-10-CM

## 2018-08-09 LAB — POC URINALSYSI DIPSTICK (AUTOMATED)
Bilirubin, UA: NEGATIVE
Blood, UA: NEGATIVE
Glucose, UA: POSITIVE — AB
Ketones, UA: NEGATIVE
Leukocytes, UA: NEGATIVE
Nitrite, UA: NEGATIVE
Protein, UA: NEGATIVE
Spec Grav, UA: 1.015 (ref 1.010–1.025)
Urobilinogen, UA: 0.2 E.U./dL
pH, UA: 6 (ref 5.0–8.0)

## 2018-08-09 MED ORDER — GLIMEPIRIDE 2 MG PO TABS: 2.0000 mg | ORAL_TABLET | Freq: Every day | ORAL | 3 refills | Status: DC

## 2018-08-09 NOTE — Progress Notes (Signed)
Virtual visit attempted through [place]. Due to national recommendations of social distancing due to COVID-19, a virtual visit is felt to be most appropriate for this patient at this time. Interactive audio and video telecommunications were attempted between myself and Joel Bennett, however failed due to patient having technical difficulties. We continued and completed visit with audio only.   Patient location: home, wife also on call Provider location: [facility], office If any vitals were documented, they were collected by patient at home unless specified below.    BP 118/72   Pulse 72   Ht 5\' 10"  (1.778 m)   BMI 36.98 kg/m    CC: urinary trouble Subjective:    Patient ID: Joel Bennett, male    DOB: 01-23-1966, 53 y.o.   MRN: [id number]  HPI: Joel Bennett is a 53 y.o. male presenting on 08/09/2018 for Abdominal Pain (C/o lower abd pain. ) and Dysuria (C/o pain/burning with urination, urinary freqency and trouble holding urine. Started about 2 mos ago. )   Several month h/o trouble controlling bladder - urge incontinence with small leaks. No stress incontinence trouble.   Some lower abd discomfort as well as infrequent dysuria.   No fevers/chills, nausea/vomiting, flank pain, blood in urine. No rectal pain or perineal pain.    DM - not recently checking due to high readings. Compliant with metformin XR 500mg  2 tablets daily. Still loose stools on XR metformin. No paresthesias. No low sugars or hypoglycemic symptoms. Staying well hydrated with water, one sweetened beverages with meals.  Lab Results  Component Value Date   HGBA1C 10.1 (H) 03/16/2017     PMR - down to 4mg  prednisone daily.      Relevant past medical, surgical, family and social history reviewed and updated as indicated. Interim medical history since our last visit reviewed. Allergies and medications reviewed and updated. Outpatient Medications Prior to Visit  Medication Sig Dispense Refill   . glucose blood (FREESTYLE LITE) test strip Use as instructed to check sugar 1-2 times daily as needed. Dx: E11.65 100 each 3  . indomethacin (INDOCIN) 50 MG capsule TAKE 1 CAPSULE (50 MG TOTAL) BY MOUTH 3 (THREE) TIMES DAILY WITH MEALS. (Patient taking differently: As needed) 90 capsule 1  . losartan (COZAAR) 100 MG tablet Take 1 tablet (100 mg total) by mouth daily. Needs annual physical appointment for additional refills 90 tablet 1  . metFORMIN (GLUCOPHAGE-XR) 500 MG 24 hr tablet Take 2 tablets (1,000 mg total) by mouth daily with breakfast. 180 tablet 0  . predniSONE (DELTASONE) 1 MG tablet Take 1 tablet (1 mg total) by mouth daily with breakfast. (Patient taking differently: Take 1 mg by mouth daily with breakfast. Takes 4 tablets daily) 120 tablet 2  . predniSONE (DELTASONE) 5 MG tablet Take 3 tablets (15 mg total) by mouth daily with breakfast. 90 tablet 4   No facility-administered medications prior to visit.      Per HPI unless specifically indicated in ROS section below Review of Systems Objective:    BP 118/72   Pulse 72   Ht 5\' 10"  (1.778 m)   BMI 36.98 kg/m   Wt Readings from Last 3 Encounters:  06/23/18 257 lb 12 oz (116.9 kg)  04/08/18 255 lb 8 oz (115.9 kg)  12/03/17 251 lb 8 oz (114.1 kg)     Physical exam: Gen: alert, NAD, not ill appearing Pulm: speaks in complete sentences without increased work of breathing Psych: normal mood, normal thought content  Results for orders placed or performed in visit on 08/09/18  POCT Urinalysis Dipstick (Automated)  Result Value Ref Range   Color, UA yellow    Clarity, UA clear    Glucose, UA Positive (A) Negative   Bilirubin, UA negative    Ketones, UA negative    Spec Grav, UA 1.015 1.010 - 1.025   Blood, UA negative    pH, UA 6.0 5.0 - 8.0   Protein, UA Negative Negative   Urobilinogen, UA 0.2 0.2 or 1.0 E.U./dL   Nitrite, UA negative    Leukocytes, UA Negative Negative   Assessment & Plan:   Problem List  Items Addressed This Visit    Urge incontinence - Primary    UA with large sugar, but no signs of infection. Ucx sent.  Doubt prostatitis as well.  Concerning for neurogenic bladder. If normal UCx, consider antimuscarinic.       Uncontrolled type 2 diabetes with neuropathy (HCC)    Poor control, overdue for f/u. Concern for neurogenic bladder as complication of poor control. Discussed with patient. Start glimepiride 2mg  with breakfast, discussed possible hypoglycemia. rec start monitoring sugars more closely. Return tomorrow for fasting labs.       Relevant Medications   glimepiride (AMARYL) 2 MG tablet   Other Relevant Orders   Lipid panel   Comprehensive metabolic panel   Hemoglobin A1c   TSH   Polymyalgia rheumatica ([place])    Diagnosed late last year, longterm on prednisone. Anticipate worsened diabetic control from this. Never followed up with me to manage his diabetes. See above.        Other Visit Diagnoses    Dysuria       Relevant Orders   POCT Urinalysis Dipstick (Automated) (Completed)   Urine Culture       Meds ordered this encounter  Medications  . glimepiride (AMARYL) 2 MG tablet    Sig: Take 1 tablet (2 mg total) by mouth daily before breakfast.    Dispense:  30 tablet    Refill:  3   Orders Placed This Encounter  Procedures  . Urine Culture    Standing Status:   Future    Standing Expiration Date:   08/09/2019  . Lipid panel  . Comprehensive metabolic panel  . Hemoglobin A1c    Standing Status:   Future    Standing Expiration Date:   08/09/2019  . TSH  . POCT Urinalysis Dipstick (Automated)    Follow up plan: No follow-ups on file.  Ria Bush, MD

## 2018-08-09 NOTE — Assessment & Plan Note (Signed)
UA with large sugar, but no signs of infection. Ucx sent.  Doubt prostatitis as well.  Concerning for neurogenic bladder. If normal UCx, consider antimuscarinic.

## 2018-08-09 NOTE — Telephone Encounter (Signed)
Spoke with pt asking for details of sxs. C/o occasional burning/pain with urination, lower abd pain and urinary frequency.  Also, has trouble holding urine. Started about 2 mos ago.   Scheduled pt for MyChart video visit at 3:30 today.  Pt will drop off sample earlier today.

## 2018-08-09 NOTE — Assessment & Plan Note (Signed)
Poor control, overdue for f/u. Concern for neurogenic bladder as complication of poor control. Discussed with patient. Start glimepiride 2mg  with breakfast, discussed possible hypoglycemia. rec start monitoring sugars more closely. Return tomorrow for fasting labs.

## 2018-08-09 NOTE — Assessment & Plan Note (Signed)
Diagnosed late last year, longterm on prednisone. Anticipate worsened diabetic control from this. Never followed up with me to manage his diabetes. See above.

## 2018-08-09 NOTE — Telephone Encounter (Signed)
Patient needs to be triaged before I can schedule. Will you call and triage patient?

## 2018-08-10 ENCOUNTER — Other Ambulatory Visit: Payer: BLUE CROSS/BLUE SHIELD

## 2018-08-10 LAB — COMPREHENSIVE METABOLIC PANEL
ALT: 22 U/L (ref 0–53)
AST: 21 U/L (ref 0–37)
Albumin: 4 g/dL (ref 3.5–5.2)
Alkaline Phosphatase: 77 U/L (ref 39–117)
BUN: 13 mg/dL (ref 6–23)
CO2: 29 mEq/L (ref 19–32)
Calcium: 9 mg/dL (ref 8.4–10.5)
Chloride: 99 mEq/L (ref 96–112)
Creatinine, Ser: 0.89 mg/dL (ref 0.40–1.50)
GFR: 88.92 mL/min (ref >60.00)
Glucose, Bld: 311 mg/dL — ABNORMAL HIGH (ref 70–99)
Potassium: 3.8 mEq/L (ref 3.5–5.1)
Sodium: 136 mEq/L (ref 135–145)
Total Bilirubin: 0.4 mg/dL (ref 0.2–1.2)
Total Protein: 6.9 g/dL (ref 6.0–8.3)

## 2018-08-10 LAB — LIPID PANEL
Cholesterol: 184 mg/dL (ref 0–200)
HDL: 31.3 mg/dL — ABNORMAL LOW (ref >39.00)
NonHDL: 153.05
Total CHOL/HDL Ratio: 6
Triglycerides: 312 mg/dL — ABNORMAL HIGH (ref 0.0–149.0)
VLDL: 62.4 mg/dL — ABNORMAL HIGH (ref 0.0–40.0)

## 2018-08-10 LAB — LDL CHOLESTEROL, DIRECT: Direct LDL: 97 mg/dL

## 2018-08-10 LAB — TSH: TSH: 1.51 u[IU]/mL (ref 0.35–4.50)

## 2018-08-11 ENCOUNTER — Encounter: Payer: Self-pay | Admitting: Family Medicine

## 2018-08-11 ENCOUNTER — Other Ambulatory Visit (INDEPENDENT_AMBULATORY_CARE_PROVIDER_SITE_OTHER): Payer: BLUE CROSS/BLUE SHIELD

## 2018-08-11 DIAGNOSIS — R300 Dysuria: Secondary | ICD-10-CM | POA: Diagnosis not present

## 2018-08-11 DIAGNOSIS — E1165 Type 2 diabetes mellitus with hyperglycemia: Secondary | ICD-10-CM | POA: Diagnosis not present

## 2018-08-11 DIAGNOSIS — E1169 Type 2 diabetes mellitus with other specified complication: Secondary | ICD-10-CM | POA: Insufficient documentation

## 2018-08-11 DIAGNOSIS — E1140 Type 2 diabetes mellitus with diabetic neuropathy, unspecified: Secondary | ICD-10-CM | POA: Diagnosis not present

## 2018-08-11 DIAGNOSIS — E785 Hyperlipidemia, unspecified: Secondary | ICD-10-CM | POA: Insufficient documentation

## 2018-08-11 DIAGNOSIS — IMO0002 Reserved for concepts with insufficient information to code with codable children: Secondary | ICD-10-CM

## 2018-08-11 LAB — POCT GLYCOSYLATED HEMOGLOBIN (HGB A1C): Hemoglobin A1C: 12.2 % — AB (ref 4.0–5.6)

## 2018-08-11 NOTE — Addendum Note (Signed)
Addended by: Ellamae Sia on: 08/11/2018 08:13 AM   Modules accepted: Orders

## 2018-08-12 ENCOUNTER — Encounter: Payer: Self-pay | Admitting: Family Medicine

## 2018-08-13 ENCOUNTER — Encounter: Payer: Self-pay | Admitting: Family Medicine

## 2018-08-13 LAB — URINE CULTURE
MICRO NUMBER:: 491946
Result:: NO GROWTH
SPECIMEN QUALITY:: ADEQUATE

## 2018-08-17 ENCOUNTER — Encounter: Payer: Self-pay | Admitting: Family Medicine

## 2018-08-17 MED ORDER — SEMAGLUTIDE(0.25 OR 0.5MG/DOS) 2 MG/1.5ML ~~LOC~~ SOPN: PEN_INJECTOR | SUBCUTANEOUS | 1 refills | Status: DC

## 2018-08-17 NOTE — Telephone Encounter (Signed)
Spoke with patient.  Fasting sugars staying 200. Will start ozempic. He will receive teaching through pharmacist.  No known fmhx thyroid cancer.

## 2018-08-24 ENCOUNTER — Other Ambulatory Visit: Payer: Self-pay | Admitting: Family Medicine

## 2018-08-25 MED ORDER — METFORMIN HCL ER 500 MG PO TB24: 1000.0000 mg | ORAL_TABLET | Freq: Every day | ORAL | 0 refills | Status: DC

## 2018-09-08 ENCOUNTER — Other Ambulatory Visit: Payer: Self-pay | Admitting: Family Medicine

## 2018-09-16 LAB — HM DIABETES EYE EXAM

## 2018-09-20 ENCOUNTER — Encounter: Payer: Self-pay | Admitting: Family Medicine

## 2018-09-24 ENCOUNTER — Encounter: Payer: Self-pay | Admitting: Family Medicine

## 2018-10-03 ENCOUNTER — Encounter: Payer: Self-pay | Admitting: Family Medicine

## 2018-10-03 ENCOUNTER — Other Ambulatory Visit: Payer: Self-pay | Admitting: Family Medicine

## 2018-10-05 MED ORDER — OLMESARTAN MEDOXOMIL 40 MG PO TABS: 40.0000 mg | ORAL_TABLET | Freq: Every day | ORAL | 1 refills | Status: DC

## 2018-10-27 ENCOUNTER — Encounter: Payer: Self-pay | Admitting: Family Medicine

## 2018-10-27 DIAGNOSIS — M2550 Pain in unspecified joint: Secondary | ICD-10-CM

## 2018-10-27 DIAGNOSIS — M353 Polymyalgia rheumatica: Secondary | ICD-10-CM

## 2018-10-27 MED ORDER — PREDNISONE 5 MG PO TABS: 5.0000 mg | ORAL_TABLET | Freq: Every day | ORAL | 1 refills | Status: DC

## 2018-10-28 ENCOUNTER — Encounter: Payer: Self-pay | Admitting: Family Medicine

## 2018-10-28 ENCOUNTER — Other Ambulatory Visit: Payer: Self-pay

## 2018-10-28 ENCOUNTER — Ambulatory Visit (INDEPENDENT_AMBULATORY_CARE_PROVIDER_SITE_OTHER): Payer: BC Managed Care – PPO | Admitting: Family Medicine

## 2018-10-28 VITALS — BP 140/78 | HR 76 | Temp 98.8°F | Ht 69.0 in | Wt 268.3 lb

## 2018-10-28 DIAGNOSIS — Z0000 Encounter for general adult medical examination without abnormal findings: Secondary | ICD-10-CM | POA: Diagnosis not present

## 2018-10-28 DIAGNOSIS — IMO0002 Reserved for concepts with insufficient information to code with codable children: Secondary | ICD-10-CM

## 2018-10-28 DIAGNOSIS — Z125 Encounter for screening for malignant neoplasm of prostate: Secondary | ICD-10-CM | POA: Diagnosis not present

## 2018-10-28 DIAGNOSIS — E1165 Type 2 diabetes mellitus with hyperglycemia: Secondary | ICD-10-CM

## 2018-10-28 DIAGNOSIS — M353 Polymyalgia rheumatica: Secondary | ICD-10-CM

## 2018-10-28 DIAGNOSIS — I10 Essential (primary) hypertension: Secondary | ICD-10-CM

## 2018-10-28 DIAGNOSIS — E1140 Type 2 diabetes mellitus with diabetic neuropathy, unspecified: Secondary | ICD-10-CM | POA: Diagnosis not present

## 2018-10-28 DIAGNOSIS — E785 Hyperlipidemia, unspecified: Secondary | ICD-10-CM

## 2018-10-28 LAB — BASIC METABOLIC PANEL WITH GFR
BUN: 12 mg/dL (ref 6–23)
CO2: 27 meq/L (ref 19–32)
Calcium: 9.4 mg/dL (ref 8.4–10.5)
Chloride: 104 meq/L (ref 96–112)
Creatinine, Ser: 0.88 mg/dL (ref 0.40–1.50)
GFR: 90.01 mL/min
Glucose, Bld: 145 mg/dL — ABNORMAL HIGH (ref 70–99)
Potassium: 4.4 meq/L (ref 3.5–5.1)
Sodium: 140 meq/L (ref 135–145)

## 2018-10-28 LAB — LIPID PANEL
Cholesterol: 167 mg/dL (ref 0–200)
HDL: 36.4 mg/dL — ABNORMAL LOW
LDL Cholesterol: 94 mg/dL (ref 0–99)
NonHDL: 130.73
Total CHOL/HDL Ratio: 5
Triglycerides: 184 mg/dL — ABNORMAL HIGH (ref 0.0–149.0)
VLDL: 36.8 mg/dL (ref 0.0–40.0)

## 2018-10-28 LAB — PSA: PSA: 0.52 ng/mL (ref 0.10–4.00)

## 2018-10-28 MED ORDER — METFORMIN HCL ER 500 MG PO TB24: 1000.0000 mg | ORAL_TABLET | Freq: Every day | ORAL | 3 refills | Status: DC

## 2018-10-28 MED ORDER — GLIMEPIRIDE 2 MG PO TABS: 2.0000 mg | ORAL_TABLET | Freq: Every day | ORAL | 3 refills | Status: DC

## 2018-10-28 MED ORDER — OLMESARTAN MEDOXOMIL 40 MG PO TABS: 40.0000 mg | ORAL_TABLET | Freq: Every day | ORAL | 3 refills | Status: DC

## 2018-10-28 NOTE — Assessment & Plan Note (Signed)
Chronic, update fructosamine today. Latest A1c 12.2%. did not tolerate GLP1 RA (ozempic) even at lowest dose due to nausea. Discussed possibly starting SGLT2.

## 2018-10-28 NOTE — Assessment & Plan Note (Signed)
Update labs today. Will need to discuss statin in diabetes history.  The 10-year ASCVD risk score Joel Bennett Joel Bennett., et al., 2013) is: 17.7%   Values used to calculate the score:     Age: 53 years     Sex: Male     Is Non-Hispanic African American: No     Diabetic: Yes     Tobacco smoker: No     Systolic Blood Pressure: 300 mmHg     Is BP treated: Yes     HDL Cholesterol: 31.3 mg/dL     Total Cholesterol: 184 mg/dL
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
## 2018-10-28 NOTE — Assessment & Plan Note (Signed)
Chronic, stable - continue olmesartan

## 2018-10-28 NOTE — Patient Instructions (Addendum)
Call GI for repeat colonoscopy  Labs today.  We may discuss starting additional diabetes medicine. We will be in touch with results. Meds refilled today.  Return as needed or in 3-4 months for follow up diabetes.   Health Maintenance, Male Adopting a healthy lifestyle and getting preventive care are important in promoting health and wellness. Ask your health care provider about:  The right schedule for you to have regular tests and exams.  Things you can do on your own to prevent diseases and keep yourself healthy. What should I know about diet, weight, and exercise? Eat a healthy diet   Eat a diet that includes plenty of vegetables, fruits, low-fat dairy products, and lean protein.  Do not eat a lot of foods that are high in solid fats, added sugars, or sodium. Maintain a healthy weight Body mass index (BMI) is a measurement that can be used to identify possible weight problems. It estimates body fat based on height and weight. Your health care provider can help determine your BMI and help you achieve or maintain a healthy weight. Get regular exercise Get regular exercise. This is one of the most important things you can do for your health. Most adults should:  Exercise for at least 150 minutes each week. The exercise should increase your heart rate and make you sweat (moderate-intensity exercise).  Do strengthening exercises at least twice a week. This is in addition to the moderate-intensity exercise.  Spend less time sitting. Even light physical activity can be beneficial. Watch cholesterol and blood lipids Have your blood tested for lipids and cholesterol at 55 years of age, then have this test every 5 years. You may need to have your cholesterol levels checked more often if:  Your lipid or cholesterol levels are high.  You are older than 55 years of age.  You are at high risk for heart disease. What should I know about cancer screening? Many types of cancers can be  detected early and may often be prevented. Depending on your health history and family history, you may need to have cancer screening at various ages. This may include screening for:  Colorectal cancer.  Prostate cancer.  Skin cancer.  Lung cancer. What should I know about heart disease, diabetes, and high blood pressure? Blood pressure and heart disease  High blood pressure causes heart disease and increases the risk of stroke. This is more likely to develop in people who have high blood pressure readings, are of African descent, or are overweight.  Talk with your health care provider about your target blood pressure readings.  Have your blood pressure checked: ? Every 3-5 years if you are 33-60 years of age. ? Every year if you are 27 years old or older.  If you are between the ages of 77 and 54 and are a current or former smoker, ask your health care provider if you should have a one-time screening for abdominal aortic aneurysm (AAA). Diabetes Have regular diabetes screenings. This checks your fasting blood sugar level. Have the screening done:  Once every three years after age 29 if you are at a normal weight and have a low risk for diabetes.  More often and at a younger age if you are overweight or have a high risk for diabetes. What should I know about preventing infection? Hepatitis B If you have a higher risk for hepatitis B, you should be screened for this virus. Talk with your health care provider to find out if you are  at risk for hepatitis B infection. Hepatitis C Blood testing is recommended for:  Everyone born from [birth year] through 1967.  Anyone with known risk factors for hepatitis C. Sexually transmitted infections (STIs)  You should be screened each year for STIs, including gonorrhea and chlamydia, if: ? You are sexually active and are younger than 55 years of age. ? You are older than 55 years of age and your health care provider tells you that you are at risk  for this type of infection. ? Your sexual activity has changed since you were last screened, and you are at increased risk for chlamydia or gonorrhea. Ask your health care provider if you are at risk.  Ask your health care provider about whether you are at high risk for HIV. Your health care provider may recommend a prescription medicine to help prevent HIV infection. If you choose to take medicine to prevent HIV, you should first get tested for HIV. You should then be tested every 3 months for as long as you are taking the medicine. Follow these instructions at home: Lifestyle  Do not use any products that contain nicotine or tobacco, such as cigarettes, e-cigarettes, and chewing tobacco. If you need help quitting, ask your health care provider.  Do not use street drugs.  Do not share needles.  Ask your health care provider for help if you need support or information about quitting drugs. Alcohol use  Do not drink alcohol if your health care provider tells you not to drink.  If you drink alcohol: ? Limit how much you have to 0-2 drinks a day. ? Be aware of how much alcohol is in your drink. In the [place] one drink equals one 12 oz bottle of beer (355 mL), one 5 oz glass of wine (148 mL), or one 1 oz glass of hard liquor (44 mL). General instructions  Schedule regular health, dental, and eye exams.  Stay current with your vaccines.  Tell your health care provider if: ? You often feel depressed. ? You have ever been abused or do not feel safe at home. Summary  Adopting a healthy lifestyle and getting preventive care are important in promoting health and wellness.  Follow your health care provider's instructions about healthy diet, exercising, and getting tested or screened for diseases.  Follow your health care provider's instructions on monitoring your cholesterol and blood pressure. This information is not intended to replace advice given to you by your health care provider.  Make sure you discuss any questions you have with your health care provider. Document Released: 09/06/2007 Document Revised: 03/03/2018 Document Reviewed: 03/03/2018 Elsevier Patient Education  2020 [employer].

## 2018-10-28 NOTE — Assessment & Plan Note (Signed)
Preventative protocols reviewed and updated unless pt declined. Discussed healthy diet and lifestyle.  

## 2018-10-28 NOTE — Assessment & Plan Note (Signed)
Appreciate sports med care. Pending rhuem eval. Planning to restart prednisone 5mg  daily.

## 2018-11-03 LAB — FRUCTOSAMINE: Fructosamine: 277 umol/L (ref 205–285)

## 2018-11-10 ENCOUNTER — Encounter: Payer: Self-pay | Admitting: Gastroenterology

## 2018-11-16 ENCOUNTER — Encounter: Payer: Self-pay | Admitting: Family Medicine

## 2018-11-16 DIAGNOSIS — Z111 Encounter for screening for respiratory tuberculosis: Secondary | ICD-10-CM | POA: Diagnosis not present

## 2018-11-16 DIAGNOSIS — Z8739 Personal history of other diseases of the musculoskeletal system and connective tissue: Secondary | ICD-10-CM | POA: Diagnosis not present

## 2018-11-16 DIAGNOSIS — M2550 Pain in unspecified joint: Secondary | ICD-10-CM | POA: Diagnosis not present

## 2018-11-16 DIAGNOSIS — M7989 Other specified soft tissue disorders: Secondary | ICD-10-CM | POA: Diagnosis not present

## 2018-11-16 LAB — CBC AND DIFFERENTIAL
Hemoglobin: 15.6 (ref 13.5–17.5)
Platelets: 207 (ref 150–399)
WBC: 7.8

## 2018-11-16 LAB — HEPATIC FUNCTION PANEL
ALT: 26 (ref 10–40)
AST: 27 (ref 14–40)
Alkaline Phosphatase: 72 (ref 25–125)
Bilirubin, Total: 0.5

## 2018-11-16 LAB — BASIC METABOLIC PANEL
Creatinine: 1 (ref 0.6–1.3)
Glucose: 177
Potassium: 4.5 (ref 3.4–5.3)
Sodium: 136 — AB (ref 137–147)

## 2018-11-23 HISTORY — PX: COLONOSCOPY: SHX174

## 2018-11-30 ENCOUNTER — Encounter: Payer: Self-pay | Admitting: Family Medicine

## 2018-11-30 ENCOUNTER — Other Ambulatory Visit: Payer: Self-pay

## 2018-11-30 ENCOUNTER — Ambulatory Visit (AMBULATORY_SURGERY_CENTER): Payer: Self-pay

## 2018-11-30 VITALS — Ht 69.0 in | Wt 277.2 lb

## 2018-11-30 DIAGNOSIS — Z86010 Personal history of colonic polyps: Secondary | ICD-10-CM

## 2018-11-30 MED ORDER — PEG 3350-KCL-NA BICARB-NACL 420 G PO SOLR: 4000.0000 mL | Freq: Once | ORAL | 0 refills | Status: AC

## 2018-11-30 NOTE — Progress Notes (Signed)
Denies allergies to eggs or soy products. Denies complication of anesthesia or sedation. Denies use of weight loss medication. Denies use of O2.   Emmi instructions given for colonoscopy.  

## 2018-12-01 ENCOUNTER — Encounter: Payer: Self-pay | Admitting: Gastroenterology

## 2018-12-13 ENCOUNTER — Telehealth: Payer: Self-pay

## 2018-12-13 NOTE — Telephone Encounter (Signed)
Covid-19 screening questions   Do you now or have you had a fever in the last 14 days? NO   Do you have any respiratory symptoms of shortness of breath or cough now or in the last 14 days? NO  Do you have any family members or close contacts with diagnosed or suspected Covid-19 in the past 14 days? NO  Have you been tested for Covid-19 and found to be positive? NO        

## 2018-12-14 ENCOUNTER — Encounter: Payer: Self-pay | Admitting: Gastroenterology

## 2018-12-14 ENCOUNTER — Ambulatory Visit (AMBULATORY_SURGERY_CENTER): Payer: BC Managed Care – PPO | Admitting: Gastroenterology

## 2018-12-14 ENCOUNTER — Other Ambulatory Visit: Payer: Self-pay

## 2018-12-14 ENCOUNTER — Other Ambulatory Visit: Payer: Self-pay | Admitting: Gastroenterology

## 2018-12-14 VITALS — BP 109/64 | HR 58 | Temp 98.0°F | Resp 11 | Ht 69.0 in | Wt 277.0 lb

## 2018-12-14 DIAGNOSIS — D123 Benign neoplasm of transverse colon: Secondary | ICD-10-CM | POA: Diagnosis not present

## 2018-12-14 DIAGNOSIS — Z86010 Personal history of colonic polyps: Secondary | ICD-10-CM

## 2018-12-14 DIAGNOSIS — Z1211 Encounter for screening for malignant neoplasm of colon: Secondary | ICD-10-CM | POA: Diagnosis not present

## 2018-12-14 MED ORDER — SODIUM CHLORIDE 0.9 % IV SOLN: 500.0000 mL | Freq: Once | INTRAVENOUS | Status: DC

## 2018-12-14 NOTE — Patient Instructions (Signed)
Thank you for allowing [place] to care for you today!  Await pathology of polyp, approximately  2 weeks by mail.  Next colonoscopy date will be recommended at that time.  Resume previous diet and medications today.  Return to your normal activities tomorrow.  Your blood sugar after the procedure was 135.      YOU HAD AN ENDOSCOPIC PROCEDURE TODAY AT [facility]:   Refer to the procedure report that was given to you for any specific questions about what was found during the examination.  If the procedure report does not answer your questions, please call your gastroenterologist to clarify.  If you requested that your care partner not be given the details of your procedure findings, then the procedure report has been included in a sealed envelope for you to review at your convenience later.  YOU SHOULD EXPECT: Some feelings of bloating in the abdomen. Passage of more gas than usual.  Walking can help get rid of the air that was put into your GI tract during the procedure and reduce the bloating. If you had a lower endoscopy (such as a colonoscopy or flexible sigmoidoscopy) you may notice spotting of blood in your stool or on the toilet paper. If you underwent a bowel prep for your procedure, you may not have a normal bowel movement for a few days.  Please Note:  You might notice some irritation and congestion in your nose or some drainage.  This is from the oxygen used during your procedure.  There is no need for concern and it should clear up in a day or so.  SYMPTOMS TO REPORT IMMEDIATELY:   Following lower endoscopy (colonoscopy or flexible sigmoidoscopy):  Excessive amounts of blood in the stool  Significant tenderness or worsening of abdominal pains  Swelling of the abdomen that is new, acute  Fever of 100F or higher   For urgent or emergent issues, a gastroenterologist can be reached at any hour by calling [phone].   DIET:  We do recommend a small meal at first,  but then you may proceed to your regular diet.  Drink plenty of fluids but you should avoid alcoholic beverages for 24 hours.  ACTIVITY:  You should plan to take it easy for the rest of today and you should NOT DRIVE or use heavy machinery until tomorrow (because of the sedation medicines used during the test).    FOLLOW UP: Our staff will call the number listed on your records 48-72 hours following your procedure to check on you and address any questions or concerns that you may have regarding the information given to you following your procedure. If we do not reach you, we will leave a message.  We will attempt to reach you two times.  During this call, we will ask if you have developed any symptoms of COVID 19. If you develop any symptoms (ie: fever, flu-like symptoms, shortness of breath, cough etc.) before then, please call [phone].  If you test positive for Covid 19 in the 2 weeks post procedure, please call and report this information to [place].    If any biopsies were taken you will be contacted by phone or by letter within the next 1-3 weeks.  Please call us at [phone] if you have not heard about the biopsies in 3 weeks.    SIGNATURES/CONFIDENTIALITY: You and/or your care partner have signed paperwork which will be entered into your electronic medical record.  These signatures attest to the fact  that that the information above on your After Visit Summary has been reviewed and is understood.  Full responsibility of the confidentiality of this discharge information lies with you and/or your care-partner.

## 2018-12-14 NOTE — Progress Notes (Signed)
Called to room to assist during endoscopic procedure.  Patient ID and intended procedure confirmed with present staff. Received instructions for my participation in the procedure from the performing physician.  

## 2018-12-14 NOTE — Progress Notes (Signed)
Temp check by JB/vital check by CW.  No changes in medical or surgical history since pre-visit screening on 11/30/18.

## 2018-12-14 NOTE — Op Note (Signed)
[facility] Patient Name: Joffre Cabreros Procedure Date: 12/14/2018 8:21 AM MRN: [id number] Endoscopist: Milus Banister , MD Age: 53 Referring MD:  Date of Birth: [birth year] Gender: Male Account #: [id number] Procedure:                Colonoscopy Indications:              High risk colon cancer surveillance: Personal                            history of colonic polyps; colonoscopy 2016 4 subCM                            polyps removed, three of them were adenomas Medicines:                Monitored Anesthesia Care Procedure:                Pre-Anesthesia Assessment:                           - Prior to the procedure, a History and Physical                            was performed, and patient medications and                            allergies were reviewed. The patient's tolerance of                            previous anesthesia was also reviewed. The risks                            and benefits of the procedure and the sedation                            options and risks were discussed with the patient.                            All questions were answered, and informed consent                            was obtained. Prior Anticoagulants: The patient has                            taken no previous anticoagulant or antiplatelet                            agents. ASA Grade Assessment: II - A patient with                            mild systemic disease. After reviewing the risks                            and benefits, the patient was deemed in  satisfactory condition to undergo the procedure.                           After obtaining informed consent, the colonoscope                            was passed under direct vision. Throughout the                            procedure, the patient's blood pressure, pulse, and                            oxygen saturations were monitored continuously. The                            Colonoscope was  introduced through the anus and                            advanced to the the cecum, identified by                            appendiceal orifice and ileocecal valve. The                            colonoscopy was performed without difficulty. The                            patient tolerated the procedure well. The quality                            of the bowel preparation was good. The ileocecal                            valve, appendiceal orifice, and rectum were                            photographed. Scope In: 8:24:39 AM Scope Out: 8:37:11 AM Scope Withdrawal Time: 0 hours 10 minutes 22 seconds  Total Procedure Duration: 0 hours 12 minutes 32 seconds  Findings:                 A 4 mm polyp was found in the transverse colon. The                            polyp was sessile. The polyp was removed with a                            cold snare. Resection and retrieval were complete.                           Internal hemorrhoids were found. The hemorrhoids                            were small.  The exam was otherwise without abnormality on                            direct and retroflexion views. Complications:            No immediate complications. Estimated blood loss:                            None. Estimated Blood Loss:     Estimated blood loss: none. Impression:               - One 4 mm polyp in the transverse colon, removed                            with a cold snare. Resected and retrieved.                           - Internal hemorrhoids.                           - The examination was otherwise normal on direct                            and retroflexion views. Recommendation:           - Patient has a contact number available for                            emergencies. The signs and symptoms of potential                            delayed complications were discussed with the                            patient. Return to normal activities  tomorrow.                            Written discharge instructions were provided to the                            patient.                           - Resume previous diet.                           - Continue present medications.                           - Await pathology results. Likely repeat                            colonoscopy in 7 years. Milus Banister, MD 12/14/2018 8:39:41 AM This report has been signed electronically.

## 2018-12-16 ENCOUNTER — Telehealth: Payer: Self-pay | Admitting: *Deleted

## 2018-12-16 DIAGNOSIS — Z6841 Body Mass Index (BMI) 40.0 and over, adult: Secondary | ICD-10-CM | POA: Diagnosis not present

## 2018-12-16 DIAGNOSIS — M2550 Pain in unspecified joint: Secondary | ICD-10-CM | POA: Diagnosis not present

## 2018-12-16 DIAGNOSIS — E790 Hyperuricemia without signs of inflammatory arthritis and tophaceous disease: Secondary | ICD-10-CM | POA: Diagnosis not present

## 2018-12-16 DIAGNOSIS — M7989 Other specified soft tissue disorders: Secondary | ICD-10-CM | POA: Diagnosis not present

## 2018-12-16 NOTE — Telephone Encounter (Signed)
  Follow up Call-  Call back number 12/14/2018  Post procedure Call Back phone  # [phone]  Permission to leave phone message Yes  Some recent data might be hidden     Patient questions:  Do you have a fever, pain , or abdominal swelling? No. Pain Score  0 *  Have you tolerated food without any problems? Yes.    Have you been able to return to your normal activities? Yes.    Do you have any questions about your discharge instructions: Diet   No. Medications  No. Follow up visit  No.  Do you have questions or concerns about your Care? No.  Actions: * If pain score is 4 or above: No action needed, pain <4.   1. Have you developed a fever since your procedure? no  2.   Have you had an respiratory symptoms (SOB or cough) since your procedure? no  3.   Have you tested positive for COVID 19 since your procedure no  4.   Have you had any family members/close contacts diagnosed with the COVID 19 since your procedure?  no   If yes to any of these questions please route to Joylene John, RN and Alphonsa Gin, Therapist, sports.

## 2018-12-21 ENCOUNTER — Encounter: Payer: Self-pay | Admitting: Gastroenterology

## 2019-01-05 ENCOUNTER — Other Ambulatory Visit: Payer: Self-pay | Admitting: Family Medicine

## 2019-01-05 NOTE — Telephone Encounter (Signed)
Prednisone Last filled:  12/12/18, #30 Last OV:  10/28/18, CPE Next OV:  02/04/19, 3-4 mo DM f/u

## 2019-01-06 NOTE — Telephone Encounter (Signed)
Sent in. Future prendisone refills to come through rheum - ?taper vs DMARD.

## 2019-01-23 DIAGNOSIS — M0600 Rheumatoid arthritis without rheumatoid factor, unspecified site: Secondary | ICD-10-CM

## 2019-01-23 HISTORY — DX: Rheumatoid arthritis without rheumatoid factor, unspecified site: M06.00

## 2019-02-04 ENCOUNTER — Other Ambulatory Visit: Payer: Self-pay

## 2019-02-04 ENCOUNTER — Ambulatory Visit: Payer: BC Managed Care – PPO | Admitting: Family Medicine

## 2019-02-04 ENCOUNTER — Encounter: Payer: Self-pay | Admitting: Family Medicine

## 2019-02-04 ENCOUNTER — Other Ambulatory Visit: Payer: BC Managed Care – PPO

## 2019-02-04 VITALS — BP 148/106 | HR 71 | Temp 98.1°F | Ht 69.0 in | Wt 280.2 lb

## 2019-02-04 DIAGNOSIS — E1140 Type 2 diabetes mellitus with diabetic neuropathy, unspecified: Secondary | ICD-10-CM

## 2019-02-04 DIAGNOSIS — E1169 Type 2 diabetes mellitus with other specified complication: Secondary | ICD-10-CM

## 2019-02-04 DIAGNOSIS — R0789 Other chest pain: Secondary | ICD-10-CM

## 2019-02-04 DIAGNOSIS — I10 Essential (primary) hypertension: Secondary | ICD-10-CM

## 2019-02-04 DIAGNOSIS — IMO0002 Reserved for concepts with insufficient information to code with codable children: Secondary | ICD-10-CM

## 2019-02-04 DIAGNOSIS — I152 Hypertension secondary to endocrine disorders: Secondary | ICD-10-CM

## 2019-02-04 DIAGNOSIS — E1165 Type 2 diabetes mellitus with hyperglycemia: Secondary | ICD-10-CM | POA: Diagnosis not present

## 2019-02-04 DIAGNOSIS — E1159 Type 2 diabetes mellitus with other circulatory complications: Secondary | ICD-10-CM

## 2019-02-04 DIAGNOSIS — E785 Hyperlipidemia, unspecified: Secondary | ICD-10-CM

## 2019-02-04 LAB — POCT GLYCOSYLATED HEMOGLOBIN (HGB A1C): Hemoglobin A1C: 8.6 % — AB (ref 4.0–5.6)

## 2019-02-04 LAB — TROPONIN I (HIGH SENSITIVITY): High Sens Troponin I: 4 ng/L (ref 2–17)

## 2019-02-04 MED ORDER — ASPIRIN EC 81 MG PO TBEC: 81.0000 mg | DELAYED_RELEASE_TABLET | Freq: Every day | ORAL | Status: AC

## 2019-02-04 MED ORDER — ATORVASTATIN CALCIUM 20 MG PO TABS: 20.0000 mg | ORAL_TABLET | Freq: Every day | ORAL | 11 refills | Status: DC

## 2019-02-04 MED ORDER — NITROGLYCERIN 0.4 MG SL SUBL
0.4000 mg | SUBLINGUAL_TABLET | Freq: Once | SUBLINGUAL | Status: AC
  Administered 2019-02-04: 08:00:00 0.4 mg via SUBLINGUAL

## 2019-02-04 NOTE — Assessment & Plan Note (Addendum)
Overall atypical description of chest pain however there are concerning EKG changes in a high risk patient (diabetic, HTN, HLD). Chest pain did improve with SL nitro in office.  I recommend ER evaluation right now as this could be MI - he refuses. Discussed risk of cardiac damage and death if not evaluated today, he declines. Will start aspirin, statin, and check STAT troponin. Will refer urgently to cardiology. Advised if recurs or worsening over weekend to seek ER care.

## 2019-02-04 NOTE — Patient Instructions (Addendum)
Monitor blood pressures at home over next 2 weeks, let me know if consistently >140/90 to start second blood pressure medicine. EKG today.  Start lipitor 20mg  daily. Start aspirin 81mg  daily.  We will refer you to cardiology for recheck of heart.  Lab test today.  If worsening pain over weekend, go to ER right away.

## 2019-02-04 NOTE — Assessment & Plan Note (Signed)
Chronic, stable on current regimen - continue. 

## 2019-02-04 NOTE — Assessment & Plan Note (Signed)
Chronic, elevated today despite olmesartan - he will monitor BP at home and let me know if consistently elevated to start second antihypertensive.

## 2019-02-04 NOTE — Progress Notes (Signed)
This visit was conducted in person.  BP (!) 148/106 (BP Location: Right Arm, Cuff Size: Large)    Pulse 71    Temp 98.1 F (36.7 C) (Temporal)    Ht 5\' 9"  (1.753 m)    Wt 280 lb 4 oz (127.1 kg)    SpO2 99%    BMI 41.39 kg/m   BP Readings from Last 3 Encounters:  02/04/19 (!) 148/106  12/14/18 109/64  10/28/18 140/78   CC: DM f/u visit Subjective:    Patient ID: Joel Bennett, male    DOB: 10-08-1965, 53 y.o.   MRN: [id number]  HPI: Jullian Kazlauskas is a 53 y.o. male presenting on 02/04/2019 for Diabetes (Here for 3-4 mo f/u.)   PMR was on prednisone seeing rheum. No longer on prednisone.   Chest discomfort - 1-2 wk h/o L chest discomfort alternating sharp to ache. Not exertional, no pressure/tightness. No aggravating, alleviating factors. No dyspnea with it. He did have cardiovascular evaluation 08/2015 with normal stress test Oval Linsey). No GERD symptoms.   HTN - Compliant with current antihypertensive regimen of olmesartan 40mg  daily. Does not check blood pressures at home - but he does have a bp cuff. No low blood pressure readings or symptoms of dizziness/syncope. Denies HT, vision changes, leg swelling.   DM - does not regularly check sugars. Compliant with antihyperglycemic regimen which includes: metformin XR 1000mg  daily, amaryl 2mg  daily. Did not tolerate ozempic due to nausea (even at 0.25mg  dose). Denies low sugars or hypoglycemic symptoms. Denies paresthesias. Last diabetic eye exam 08/2018. Pneumovax: DUE. Prevnar: not due. Glucometer brand: freestyle. DSME: declines. His metformin XR lot wasn't affected by recent recall. Lab Results  Component Value Date   HGBA1C 8.6 (A) 02/04/2019  fructosamine 277 =~6.7% A1c (10/2018) Diabetic Foot Exam - Simple   No data filed     Lab Results  Component Value Date   MICROALBUR 0.8 02/20/2014         Relevant past medical, surgical, family and social history reviewed and updated as indicated. Interim medical history since our  last visit reviewed. Allergies and medications reviewed and updated. Outpatient Medications Prior to Visit  Medication Sig Dispense Refill   allopurinol (ZYLOPRIM) 100 MG tablet Take 100 mg by mouth daily.     glimepiride (AMARYL) 2 MG tablet Take 1 tablet (2 mg total) by mouth daily before breakfast. 90 tablet 3   glucose blood (FREESTYLE LITE) test strip Use as instructed to check sugar 1-2 times daily as needed. Dx: E11.65 100 each 3   indomethacin (INDOCIN) 50 MG capsule TAKE 1 CAPSULE (50 MG TOTAL) BY MOUTH 3 (THREE) TIMES DAILY WITH MEALS. (Patient taking differently: As needed) 90 capsule 1   metFORMIN (GLUCOPHAGE-XR) 500 MG 24 hr tablet Take 2 tablets (1,000 mg total) by mouth daily with breakfast. 180 tablet 3   olmesartan (BENICAR) 40 MG tablet Take 1 tablet (40 mg total) by mouth daily. 90 tablet 3   predniSONE (DELTASONE) 5 MG tablet TAKE 1 TABLET BY MOUTH EVERY DAY WITH BREAKFAST 30 tablet 0   No facility-administered medications prior to visit.      Per HPI unless specifically indicated in ROS section below Review of Systems Objective:    BP (!) 148/106 (BP Location: Right Arm, Cuff Size: Large)    Pulse 71    Temp 98.1 F (36.7 C) (Temporal)    Ht 5\' 9"  (1.753 m)    Wt 280 lb 4 oz (127.1 kg)    SpO2 99%  BMI 41.39 kg/m   Wt Readings from Last 3 Encounters:  02/04/19 280 lb 4 oz (127.1 kg)  12/14/18 277 lb (125.6 kg)  11/30/18 277 lb 3.2 oz (125.7 kg)    Physical Exam Vitals signs and nursing note reviewed.  Constitutional:      General: He is not in acute distress.    Appearance: Normal appearance. He is well-developed. He is obese. He is not ill-appearing.  Eyes:     General: No scleral icterus.    Extraocular Movements: Extraocular movements intact.     Conjunctiva/sclera: Conjunctivae normal.     Pupils: Pupils are equal, round, and reactive to light.  Cardiovascular:     Rate and Rhythm: Normal rate and regular rhythm.     Pulses: Normal pulses.      Heart sounds: Normal heart sounds. No murmur.  Pulmonary:     Effort: Pulmonary effort is normal. No respiratory distress.     Breath sounds: Normal breath sounds. No wheezing, rhonchi or rales.  Chest:     Chest wall: Tenderness (mild discomfort at L costochondral junction, does not reproduce described chest discomfort however) present.  Musculoskeletal:     Right lower leg: No edema.     Left lower leg: No edema.     Comments: See HPI for foot exam if done  Skin:    General: Skin is warm and dry.     Findings: No rash.  Neurological:     Mental Status: He is alert.  Psychiatric:        Mood and Affect: Mood normal.        Behavior: Behavior normal.       Results for orders placed or performed in visit on 02/04/19  POCT glycosylated hemoglobin (Hb A1C)  Result Value Ref Range   Hemoglobin A1C 8.6 (A) 4.0 - 5.6 %   HbA1c POC (<> result, manual entry)     HbA1c, POC (prediabetic range)     HbA1c, POC (controlled diabetic range)     EKG - NSR rate 60s, LAD with LAFB, normal intervals, possible ST elevation anteriorly Assessment & Plan:   Problem List Items Addressed This Visit    Uncontrolled type 2 diabetes with neuropathy (HCC)    Chronic, stable on current regimen - continue.       Relevant Medications   atorvastatin (LIPITOR) 20 MG tablet   aspirin EC 81 MG tablet   Other Relevant Orders   POCT glycosylated hemoglobin (Hb A1C) (Completed)   Ambulatory referral to Cardiology   Hypertension associated with diabetes ([place])    Chronic, elevated today despite olmesartan - he will monitor BP at home and let me know if consistently elevated to start second antihypertensive.       Relevant Medications   atorvastatin (LIPITOR) 20 MG tablet   aspirin EC 81 MG tablet   Other Relevant Orders   Ambulatory referral to Cardiology   Dyslipidemia associated with type 2 diabetes mellitus ([place])    Chol levels largely well controlled however in diabetic discussed indication for  statin. Start lipitor 20mg  daily.  The 10-year ASCVD risk score Mikey Bussing DC Jr., et al., 2013) is: 15.3%   Values used to calculate the score:     Age: 48 years     Sex: Male     Is Non-Hispanic African American: No     Diabetic: Yes     Tobacco smoker: No     Systolic Blood Pressure: 123456 mmHg     Is  BP treated: Yes     HDL Cholesterol: 36.4 mg/dL     Total Cholesterol: 167 mg/dL       Relevant Medications   atorvastatin (LIPITOR) 20 MG tablet   aspirin EC 81 MG tablet   Other Relevant Orders   Ambulatory referral to Cardiology   Chest pain - Primary    Overall atypical description of chest pain however there are concerning EKG changes in a high risk patient (diabetic, HTN, HLD). Chest pain did improve with SL nitro in office.  I recommend ER evaluation right now as this could be MI - he refuses. Discussed risk of cardiac damage and death if not evaluated today, he declines. Will start aspirin, statin, and check STAT troponin. Will refer urgently to cardiology. Advised if recurs or worsening over weekend to seek ER care.           Meds ordered this encounter  Medications   nitroGLYCERIN (NITROSTAT) SL tablet 0.4 mg   atorvastatin (LIPITOR) 20 MG tablet    Sig: Take 1 tablet (20 mg total) by mouth daily.    Dispense:  30 tablet    Refill:  11   aspirin EC 81 MG tablet    Sig: Take 1 tablet (81 mg total) by mouth daily.    Dispense:      Orders Placed This Encounter  Procedures   Troponin I   Ambulatory referral to Cardiology    Referral Priority:   Urgent    Referral Type:   Consultation    Referral Reason:   Specialty Services Required    Requested Specialty:   Cardiology    Number of Visits Requested:   1   POCT glycosylated hemoglobin (Hb A1C)   EKG 12-Lead    Follow up plan: No follow-ups on file.  Ria Bush, MD

## 2019-02-04 NOTE — Assessment & Plan Note (Addendum)
Chol levels largely well controlled however in diabetic discussed indication for statin. Start lipitor 20mg  daily.  The 10-year ASCVD risk score Mikey Bussing DC Brooke Bonito., et al., 2013) is: 15.3%   Values used to calculate the score:     Age: 53 years     Sex: Male     Is Non-Hispanic African American: No     Diabetic: Yes     Tobacco smoker: No     Systolic Blood Pressure: 123456 mmHg     Is BP treated: Yes     HDL Cholesterol: 36.4 mg/dL     Total Cholesterol: 167 mg/dL

## 2019-02-09 ENCOUNTER — Other Ambulatory Visit: Payer: Self-pay

## 2019-02-09 ENCOUNTER — Emergency Department: Payer: BC Managed Care – PPO

## 2019-02-09 ENCOUNTER — Emergency Department
Admission: EM | Admit: 2019-02-09 | Discharge: 2019-02-09 | Disposition: A | Discharge status: Home / Self Care | Payer: BC Managed Care – PPO | Attending: Emergency Medicine | Admitting: Emergency Medicine

## 2019-02-09 DIAGNOSIS — I10 Essential (primary) hypertension: Secondary | ICD-10-CM | POA: Insufficient documentation

## 2019-02-09 DIAGNOSIS — E119 Type 2 diabetes mellitus without complications: Secondary | ICD-10-CM | POA: Diagnosis not present

## 2019-02-09 DIAGNOSIS — R0789 Other chest pain: Secondary | ICD-10-CM | POA: Insufficient documentation

## 2019-02-09 DIAGNOSIS — Z7984 Long term (current) use of oral hypoglycemic drugs: Secondary | ICD-10-CM | POA: Insufficient documentation

## 2019-02-09 DIAGNOSIS — Z7982 Long term (current) use of aspirin: Secondary | ICD-10-CM | POA: Insufficient documentation

## 2019-02-09 DIAGNOSIS — R079 Chest pain, unspecified: Secondary | ICD-10-CM

## 2019-02-09 DIAGNOSIS — F17220 Nicotine dependence, chewing tobacco, uncomplicated: Secondary | ICD-10-CM | POA: Insufficient documentation

## 2019-02-09 DIAGNOSIS — Z79899 Other long term (current) drug therapy: Secondary | ICD-10-CM | POA: Insufficient documentation

## 2019-02-09 LAB — CBC
HCT: 43.6 % (ref 39.0–52.0)
Hemoglobin: 14.6 g/dL (ref 13.0–17.0)
MCH: 31.7 pg (ref 26.0–34.0)
MCHC: 33.5 g/dL (ref 30.0–36.0)
MCV: 94.8 fL (ref 80.0–100.0)
Platelets: 178 10*3/uL (ref 150–400)
RBC: 4.6 MIL/uL (ref 4.22–5.81)
RDW: 12.8 % (ref 11.5–15.5)
WBC: 7.1 10*3/uL (ref 4.0–10.5)
nRBC: 0 % (ref 0.0–0.2)

## 2019-02-09 LAB — BASIC METABOLIC PANEL
Anion gap: 8 (ref 5–15)
BUN: 12 mg/dL (ref 6–20)
CO2: 25 mmol/L (ref 22–32)
Calcium: 9 mg/dL (ref 8.9–10.3)
Chloride: 106 mmol/L (ref 98–111)
Creatinine, Ser: 1.02 mg/dL (ref 0.61–1.24)
GFR calc Af Amer: >60 mL/min (ref >60)
GFR calc non Af Amer: >60 mL/min (ref >60)
Glucose, Bld: 140 mg/dL — ABNORMAL HIGH (ref 70–99)
Potassium: 3.8 mmol/L (ref 3.5–5.1)
Sodium: 139 mmol/L (ref 135–145)

## 2019-02-09 LAB — TROPONIN I (HIGH SENSITIVITY)
Troponin I (High Sensitivity): 6 ng/L (ref <18)
Troponin I (High Sensitivity): 4 ng/L (ref <18)

## 2019-02-09 MED ORDER — KETOROLAC TROMETHAMINE 30 MG/ML IJ SOLN
15.0000 mg | Freq: Once | INTRAMUSCULAR | Status: AC
  Administered 2019-02-09: 15 mg via INTRAVENOUS
  Filled 2019-02-09: qty 1

## 2019-02-09 MED ORDER — ASPIRIN 81 MG PO CHEW
324.0000 mg | CHEWABLE_TABLET | Freq: Once | ORAL | Status: AC
  Administered 2019-02-09: 324 mg via ORAL
  Filled 2019-02-09: qty 4

## 2019-02-09 MED ORDER — NITROGLYCERIN 0.4 MG SL SUBL
0.4000 mg | SUBLINGUAL_TABLET | Freq: Once | SUBLINGUAL | Status: DC
  Filled 2019-02-09: qty 1

## 2019-02-09 MED ORDER — METOPROLOL TARTRATE 50 MG PO TABS
50.0000 mg | ORAL_TABLET | Freq: Once | ORAL | Status: AC
  Administered 2019-02-09: 50 mg via ORAL
  Filled 2019-02-09: qty 1

## 2019-02-09 MED ORDER — ACETAMINOPHEN 500 MG PO TABS
1000.0000 mg | ORAL_TABLET | Freq: Once | ORAL | Status: AC
  Administered 2019-02-09: 1000 mg via ORAL
  Filled 2019-02-09: qty 2

## 2019-02-09 MED ORDER — METOPROLOL TARTRATE 25 MG PO TABS: 50.0000 mg | ORAL_TABLET | Freq: Once | ORAL | 0 refills | Status: DC

## 2019-02-09 MED ORDER — TICAGRELOR 90 MG PO TABS
180.0000 mg | ORAL_TABLET | Freq: Once | ORAL | Status: AC
  Administered 2019-02-09: 180 mg via ORAL
  Filled 2019-02-09: qty 2

## 2019-02-09 MED ORDER — ISOSORBIDE MONONITRATE ER 60 MG PO TB24
30.0000 mg | ORAL_TABLET | Freq: Every day | ORAL | Status: DC
  Administered 2019-02-09: 30 mg via ORAL
  Filled 2019-02-09: qty 1

## 2019-02-09 NOTE — ED Triage Notes (Signed)
Pt comes in for CP a week ago but worsening today. Left sided chest pain. Stabbing pain with squeezing. Has had this in the past but was not dx with anything specific.

## 2019-02-09 NOTE — ED Provider Notes (Signed)
[facility] Emergency Department Provider Note  ____________________________________________  Time seen: Approximately 4:23 PM  The following is a medical screening exam note. It is intended that the patient await an ER room assignment for detailed exam, diagnosis, and disposition.  I have reviewed the triage vital signs.    HISTORY  Chief Complaint No chief complaint on file.   HPI Joel Bennett is a 53 y.o. male who presents to the emergency department for treatment and evaluation  of chest pain started about a week ago it seems to be worse today.  Pain is in the left chest with an associated stabbing pain and squeezing sensation.  He has had this in the past but was not diagnosed with anything specific even after evaluation by cardiology.  No alleviating measures attempted prior to arrival.  Past Medical History:  Diagnosis Date  . Allergic rhinitis   . Allergy   . DDD (degenerative disc disease), lumbar   . Diabetes mellitus without complication ([place])   . Diabetes type 2, uncontrolled ([place]) 2012  . Gout   . History of depression 2000   treated with meds  . Hypertension 1994  . Stool incontinence 12/2011   neg giardia, neg O&P, neg iFOB from prior PCP    Patient Active Problem List   Diagnosis Date Noted  . Dyslipidemia associated with type 2 diabetes mellitus ([place]) 08/11/2018  . Urge incontinence 08/09/2018  . Polymyalgia rheumatica ([place]) 04/08/2018  . Anxiety 10/08/2015  . Chest pain 07/20/2015  . Facial paresthesia 12/26/2014  . 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 Abnormal sensation in left ear 01/23/2014  . Health maintenance examination 02/25/2013  . Gout   . Allergic rhinitis   . DDD (degenerative disc disease), lumbar   . Hypertension associated with diabetes ([place])   . Uncontrolled type 2 diabetes with neuropathy [facility])     Past Surgical History:  Procedure Laterality Date  . ANKLE SURGERY Left 1999  . BACK SURGERY   1997   Lumbar  . CARDIOVASCULAR STRESS TEST  08/2015   WNL (Dr Oval Linsey)  . COLONOSCOPY  05/2014   TA several, rpt 3 yrs Ardis Hughs)  . KNEE SURGERY Right 1985  . SHOULDER SURGERY Left 2002   x 2  . Thumb Surgery Left    Joint Fusion on left hand    Prior to Admission medications   Medication Sig Start Date End Date Taking? Authorizing Provider  allopurinol (ZYLOPRIM) 100 MG tablet Take 100 mg by mouth daily. 11/19/18   [provider]  aspirin EC 81 MG tablet Take 1 tablet (81 mg total) by mouth daily. 02/04/19   Ria Bush, MD  atorvastatin (LIPITOR) 20 MG tablet Take 1 tablet (20 mg total) by mouth daily. 02/04/19   Ria Bush, MD  glimepiride (AMARYL) 2 MG tablet Take 1 tablet (2 mg total) by mouth daily before breakfast. 10/28/18   Ria Bush, MD  glucose blood (FREESTYLE LITE) test strip Use as instructed to check sugar 1-2 times daily as needed. Dx: E11.65 02/22/14   Ria Bush, MD  indomethacin (INDOCIN) 50 MG capsule TAKE 1 CAPSULE (50 MG TOTAL) BY MOUTH 3 (THREE) TIMES DAILY WITH MEALS. Patient taking differently: As needed 12/31/15   Ria Bush, MD  metFORMIN (GLUCOPHAGE-XR) 500 MG 24 hr tablet Take 2 tablets (1,000 mg total) by mouth daily with breakfast. 10/28/18   Ria Bush, MD  olmesartan (BENICAR) 40 MG tablet Take 1 tablet (40 mg total) by mouth daily. 10/28/18  Ria Bush, MD    Allergies Ozempic (0.25 or 0.5 mg-dose) [semaglutide(0.25 or 0.5mg -dos)]  Family History  Problem Relation Age of Onset  . Cancer Maternal Aunt        lung (smoker)  . Rectal cancer Maternal Aunt   . Cancer Maternal Aunt        rectal  . Cancer Mother        lung (smoker)  . Cancer Paternal Grandmother        lung (smoker)  . CAD Paternal Grandmother        MI  . Cancer Other        paternal great uncle unsure type  . Alcohol abuse Paternal Grandfather   . Alcohol abuse Maternal Grandfather   . Alcohol abuse Paternal Uncle         and drugs  . CAD Paternal Uncle        MI  . CAD Paternal Aunt 37       MI  . Stroke Maternal Grandmother   . Stroke Cousin   . Diabetes Father   . Atrial fibrillation Father   . Diabetes Cousin   . Colon polyps Neg Hx   . Esophageal cancer Neg Hx   . Stomach cancer Neg Hx     Social History Social History   Tobacco Use  . Smoking status: Never Smoker  . Smokeless tobacco: Current User    Types: Snuff  Substance Use Topics  . Alcohol use: Yes    Alcohol/week: 0.0 standard drinks    Comment: occasional  . Drug use: No    Review of Systems Constitutional: Negative for fever. ENT: Negative for sore throat. Respiratory: Negative for cough or shortness of breath Gastrointestinal: No abdominal pain.  No nausea, no vomiting.  No diarrhea.  Musculoskeletal: Negative for generalized body aches. Skin: Negative for rash/lesion/wound. Neurological: Negative for headaches, focal weakness or numbness.  ____________________________________________   PHYSICAL EXAM:  VITAL SIGNS:  Today's Vitals   02/09/19 1629 02/09/19 1630 02/09/19 1748 02/09/19 2056  BP:  (!) 153/96  136/63  Pulse: 78   69  Resp: 17   18  Temp: 98.9 F (37.2 C)     TempSrc: Oral     SpO2: 97%   96%  Weight:      Height:      PainSc:   6  5    Body mass index is 41.35 kg/m.   Constitutional: Alert and oriented. No acute distress. Head: Atraumatic. Nose: No congestion/rhinnorhea. Mouth/Throat: Mucous membranes are moist. Neck: No stridor.  Cardiovascular: Good peripheral circulation. Respiratory: Normal respiratory effort. Musculoskeletal: No restriction Neurologic:  Normal speech and language. No gross focal neurologic deficits are appreciated. Speech is normal. No gait instability. Skin:  Skin is warm, dry and intact. No rash noted. Psychiatric: Mood and affect are normal. Speech and behavior are normal.  ____________________________________________   LABS (all labs ordered are listed,  but only abnormal results are displayed)  Labs Reviewed - No data to display ____________________________________________  EKG  Signed by MD ____________________________________________   INITIAL CLINICAL IMPRESSION(S)  Atypical chest pain.  Patient was encouraged to await room assignment for further evaluation and diagnosis.  Vital signs are stable.   Victorino Dike, FNP 02/09/19 2356    Merlyn Lot, MD 02/12/19 [phone]

## 2019-02-09 NOTE — ED Provider Notes (Signed)
[facility] Emergency Department Provider Note  ____________________________________________   First MD Initiated Contact with Patient 02/09/19 1759     (approximate)  I have reviewed the triage vital signs and the nursing notes.   HISTORY  Chief Complaint Chest Pain    HPI Joel Bennett is a 53 y.o. male diabetes, hypertension, hyperlipidemia who presents with chest pain.  Patient states that he is having left-sided chest pain occasionally going down his left arm, intermittent, nothing makes it come on, achy sensation with occasional sharp stabbing this, episode today started this morning has been constant however has been happening intermittently for the past week.  He has had a prior 3 years ago and had a negative Lexiscan.  Denies any shortness of breath, cough, fevers   Patient was seen by his primary care doctor on 11/13.  There they said that the chest pain sounded atypical but given some EKG changes and a high risk patient they had recommended he come to the ER.  He was referred urgently to cardiology.  He has not had follow-up yet.  He did have a troponin done at that time that was 4.     Past Medical History:  Diagnosis Date  . Allergic rhinitis   . Allergy   . DDD (degenerative disc disease), lumbar   . Diabetes mellitus without complication ([place])   . Diabetes type 2, uncontrolled ([place]) 2012  . Gout   . History of depression 2000   treated with meds  . Hypertension 1994  . Stool incontinence 12/2011   neg giardia, neg O&P, neg iFOB from prior PCP    Patient Active Problem List   Diagnosis Date Noted  . Dyslipidemia associated with type 2 diabetes mellitus ([place]) 08/11/2018  . Urge incontinence 08/09/2018  . Polymyalgia rheumatica ([place]) 04/08/2018  . Anxiety 10/08/2015  . Chest pain 07/20/2015  . Facial paresthesia 12/26/2014  . 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 Abnormal sensation in left ear 01/23/2014  . Health  maintenance examination 02/25/2013  . Gout   . Allergic rhinitis   . DDD (degenerative disc disease), lumbar   . Hypertension associated with diabetes ([place])   . Uncontrolled type 2 diabetes with neuropathy [facility])     Past Surgical History:  Procedure Laterality Date  . ANKLE SURGERY Left 1999  . BACK SURGERY  1997   Lumbar  . CARDIOVASCULAR STRESS TEST  08/2015   WNL (Dr Oval Linsey)  . COLONOSCOPY  05/2014   TA several, rpt 3 yrs Ardis Hughs)  . KNEE SURGERY Right 1985  . SHOULDER SURGERY Left 2002   x 2  . Thumb Surgery Left    Joint Fusion on left hand    Prior to Admission medications   Medication Sig Start Date End Date Taking? Authorizing Provider  allopurinol (ZYLOPRIM) 100 MG tablet Take 100 mg by mouth daily. 11/19/18   [provider]  aspirin EC 81 MG tablet Take 1 tablet (81 mg total) by mouth daily. 02/04/19   Ria Bush, MD  atorvastatin (LIPITOR) 20 MG tablet Take 1 tablet (20 mg total) by mouth daily. 02/04/19   Ria Bush, MD  glimepiride (AMARYL) 2 MG tablet Take 1 tablet (2 mg total) by mouth daily before breakfast. 10/28/18   Ria Bush, MD  glucose blood (FREESTYLE LITE) test strip Use as instructed to check sugar 1-2 times daily as needed. Dx: E11.65 02/22/14   Ria Bush, MD  indomethacin (INDOCIN) 50 MG capsule TAKE 1 CAPSULE (50 MG  TOTAL) BY MOUTH 3 (THREE) TIMES DAILY WITH MEALS. Patient taking differently: As needed 12/31/15   Ria Bush, MD  metFORMIN (GLUCOPHAGE-XR) 500 MG 24 hr tablet Take 2 tablets (1,000 mg total) by mouth daily with breakfast. 10/28/18   Ria Bush, MD  olmesartan (BENICAR) 40 MG tablet Take 1 tablet (40 mg total) by mouth daily. 10/28/18   Ria Bush, MD    Allergies Ozempic (0.25 or 0.5 mg-dose) [semaglutide(0.25 or 0.5mg -dos)]  Family History  Problem Relation Age of Onset  . Cancer Maternal Aunt        lung (smoker)  . Rectal cancer Maternal Aunt   . Cancer Maternal Aunt         rectal  . Cancer Mother        lung (smoker)  . Cancer Paternal Grandmother        lung (smoker)  . CAD Paternal Grandmother        MI  . Cancer Other        paternal great uncle unsure type  . Alcohol abuse Paternal Grandfather   . Alcohol abuse Maternal Grandfather   . Alcohol abuse Paternal Uncle        and drugs  . CAD Paternal Uncle        MI  . CAD Paternal Aunt 62       MI  . Stroke Maternal Grandmother   . Stroke Cousin   . Diabetes Father   . Atrial fibrillation Father   . Diabetes Cousin   . Colon polyps Neg Hx   . Esophageal cancer Neg Hx   . Stomach cancer Neg Hx     Social History Social History   Tobacco Use  . Smoking status: Never Smoker  . Smokeless tobacco: Current User    Types: Snuff  Substance Use Topics  . Alcohol use: Yes    Alcohol/week: 0.0 standard drinks    Comment: occasional  . Drug use: No      Review of Systems Constitutional: No fever/chills Eyes: No visual changes. ENT: No sore throat. Cardiovascular: Positive chest pain Respiratory: Denies shortness of breath. Gastrointestinal: No abdominal pain.  No nausea, no vomiting.  No diarrhea.  No constipation. Genitourinary: Negative for dysuria. Musculoskeletal: Negative for back pain. Skin: Negative for rash. Neurological: Negative for headaches, focal weakness or numbness. All other ROS negative ____________________________________________   PHYSICAL EXAM:  VITAL SIGNS: ED Triage Vitals  Enc Vitals Group     BP 02/09/19 1630 (!) 153/96     Pulse Rate 02/09/19 1629 78     Resp 02/09/19 1629 17     Temp 02/09/19 1629 98.9 F (37.2 C)     Temp Source 02/09/19 1629 Oral     SpO2 02/09/19 1629 97 %     Weight 02/09/19 1626 279 lb 15.8 oz (127 kg)     Height 02/09/19 1626 5\' 9"  (1.753 m)     Head Circumference --      Peak Flow --      Pain Score 02/09/19 1626 5     Pain Loc --      Pain Edu? --      Excl. in [place]? --     Constitutional: Alert and oriented. Well  appearing and in no acute distress. Eyes: Conjunctivae are normal. EOMI. Head: Atraumatic. Nose: No congestion/rhinnorhea. Mouth/Throat: Mucous membranes are moist.   Neck: No stridor. Trachea Midline. FROM Cardiovascular: Normal rate, regular rhythm. Grossly normal heart sounds.  Good peripheral circulation. Respiratory: Normal  respiratory effort.  No retractions. Lungs CTAB. Gastrointestinal: Soft and nontender. No distention. No abdominal bruits.  Musculoskeletal: No lower extremity tenderness nor edema.  No joint effusions. Neurologic:  Normal speech and language. No gross focal neurologic deficits are appreciated.  Skin:  Skin is warm, dry and intact. No rash noted. Psychiatric: Mood and affect are normal. Speech and behavior are normal. GU: Deferred   ____________________________________________   LABS (all labs ordered are listed, but only abnormal results are displayed)  Labs Reviewed  BASIC METABOLIC PANEL - Abnormal; Notable for the following components:      Result Value   Glucose, Bld 140 (*)    All other components within normal limits  CBC  TROPONIN I (HIGH SENSITIVITY)  TROPONIN I (HIGH SENSITIVITY)   ____________________________________________   ED ECG REPORT I, Vanessa [place], the attending physician, personally viewed and interpreted this ECG.  EKG normal sinus, T wave inversion in aVL, no ST elevation, no T wave inversion.  No dynamic changes from his EKG done on 11/13.  This is also similar to an EKG 3 years ago. ____________________________________________  RADIOLOGY I, Vanessa [place], personally viewed and evaluated these images (plain radiographs) as part of my medical decision making, as well as reviewing the written report by the radiologist.  ED MD interpretation:  No PNA   Official radiology report(s): Dg Chest 2 View  Result Date: 02/09/2019 CLINICAL DATA:  Chest pain EXAM: CHEST - 2 VIEW COMPARISON:  None. FINDINGS: No consolidation, features  of edema, pneumothorax, or effusion. Pulmonary vascularity is normally distributed. The cardiomediastinal contours are unremarkable. No acute osseous or soft tissue abnormality. IMPRESSION: No acute cardiopulmonary abnormality. Electronically Signed   By: Lovena Le M.D.   On: 02/09/2019 17:10    ____________________________________________   PROCEDURES  Procedure(s) performed (including Critical Care):  Procedures   ____________________________________________   INITIAL IMPRESSION / ASSESSMENT AND PLAN / ED COURSE   Joel Bennett was evaluated in Emergency Department on 02/09/2019 for the symptoms described in the history of present illness. He was evaluated in the context of the global COVID-19 pandemic, which necessitated consideration that the patient might be at risk for infection with the SARS-CoV-2 virus that causes COVID-19. Institutional protocols and algorithms that pertain to the evaluation of patients at risk for COVID-19 are in a state of rapid change based on information released by regulatory bodies including the CDC and federal and state organizations. These policies and algorithms were followed during the patient's care in the ED.    Most Likely DDx:  -MSK (atypical chest pain) but will get cardiac markers to evaluate for ACS given risk factors/age   DDx that was also considered d/t potential to cause harm, but was found less likely based on history and physical (as detailed above): -PNA (no fevers, cough but CXR to evaluate) -PNX (reassured with equal b/l breath sounds, CXR to evaluate) -Symptomatic anemia (will get H&H) -Pulmonary embolism as no sob at rest, not pleuritic in nature, no hypoxia -Aortic Dissection as no tearing pain and no radiation to the mid back, pulses equal, xray no widen mediastinum and pain has been going on for a week.  -Pericarditis no rub on exam, EKG changes or hx to suggest dx -Tamponade (no notable SOB, tachycardic, hypotensive)  -Esophageal rupture (no h/o diffuse vomitting/no crepitus)  Chest x-ray no evidence of pneumonia.  Troponin  6 No white count elevation to suggest infection. No evidence of anemia.  Repeat troponin is 4.  Reevaluated patient after  the medications and he still having 5-10 chest pain.  I discussed with patient is reassuring that his cardiac markers were negative however given his continued chest pain will discuss with the cardiology team.   Discussed with Dr. Lovena Le from cardiology.  Given patient's EKG and cardiac markers are reassuring he would recommend patient follow-up tomorrow in clinic for CT angio.  I also discussed with patient about outpatient follow-up versus coming to the hospital the patient says he would definitely prefer to follow-up outpatient.   Per Dr. Lovena Le patient was given loading dose of aspirin, Brilinta and a dose of isosorbide and sublingual nitro in the meantime.  Also give a dose of metoprolol to help get his heart rate less than 60 for the CT scan.  I also prescribed 1 additional metoprolol to give 1 hour prior to the CT scan.  I discussed with patient and he is agreeable to this plan given he prefer to follow-up outpatient anyways.  Patient is going on for a week now and given his cardiac markers are negative this is reasonable to follow-up outpatient.  Also sounds more like atypical chest pain.  But the CT scan will help further delineate given his risk factors.  I discussed the provisional nature of ED diagnosis, the treatment so far, the ongoing plan of care, follow up appointments and return precautions with the patient and any family or support people present. They expressed understanding and agreed with the plan, discharged home.   ____________________________________________   FINAL CLINICAL IMPRESSION(S) / ED DIAGNOSES   Final diagnoses:  Chest pain, unspecified type     MEDICATIONS GIVEN DURING THIS VISIT:  Medications  aspirin chewable tablet  324 mg (has no administration in time range)  ticagrelor (BRILINTA) tablet 180 mg (has no administration in time range)  nitroGLYCERIN (NITROSTAT) SL tablet 0.4 mg (has no administration in time range)  isosorbide mononitrate (IMDUR) 24 hr tablet 30 mg (has no administration in time range)  metoprolol tartrate (LOPRESSOR) tablet 50 mg (has no administration in time range)  ketorolac (TORADOL) 30 MG/ML injection 15 mg (15 mg Intravenous Given 02/09/19 1850)  acetaminophen (TYLENOL) tablet 1,000 mg (1,000 mg Oral Given 02/09/19 1849)     ED Discharge Orders         Ordered    metoprolol tartrate (LOPRESSOR) 25 MG tablet   Once     02/09/19 2038           Note:  This document was prepared using Dragon voice recognition software and may include unintentional dictation errors.   Vanessa [place], MD 02/09/19 2043

## 2019-02-09 NOTE — Discharge Instructions (Addendum)
I discussed with Dr. Lovena Le from cardiology.  We have given you some medications to help slow your heart rate and to help thin your blood in preparation for CT scan tomorrow morning most likely around 9 AM.  Call his office tomorrow to confirm the time.  Say that you were seen in the ER and that you were told to come in for CT scan coronary.  When she lets you know the time of the appointment then take the metoprolol pill (2 pills) 1 hour prior to going in.  T the heart rate needs to be less than 60 in order to do the CT scan.

## 2019-02-10 ENCOUNTER — Telehealth: Payer: Self-pay | Admitting: Internal Medicine

## 2019-02-10 NOTE — Telephone Encounter (Signed)
Patient's wife calling in regards to CT scan scheduled for today with Dr. Lovena Le.

## 2019-02-10 NOTE — Telephone Encounter (Signed)
Received call from operator about this patient this morning. He states he was advised yesterday while in [facility] ED that per Dr. Lovena Le, he will have a coronary CT today. After reviewing the patient's chart, and calling [facility] and [facility] and neither location has patient scheduled for a CT today.  Willeen Cass, RN, Dr. Tanna Furry nurse verified with him that he did not write orders or speak with anyone about getting the CT done today. I called patient and apologized for the confusion. I advised him to keep his appointment with Dr. Garen Lah tomorrow at the [facility] office for further evaluation and testing. Patient verbalized understanding and agreement and thanked me for the call.

## 2019-02-10 NOTE — Telephone Encounter (Signed)
Per Dr. Lovena Le- he did not speak with anyone from [facility] last night.

## 2019-02-11 ENCOUNTER — Encounter: Payer: Self-pay | Admitting: Cardiology

## 2019-02-11 ENCOUNTER — Ambulatory Visit (INDEPENDENT_AMBULATORY_CARE_PROVIDER_SITE_OTHER): Payer: BC Managed Care – PPO | Admitting: Cardiology

## 2019-02-11 ENCOUNTER — Other Ambulatory Visit: Payer: Self-pay

## 2019-02-11 ENCOUNTER — Telehealth: Payer: Self-pay | Admitting: Cardiology

## 2019-02-11 VITALS — BP 144/88 | HR 65 | Temp 96.4°F | Ht 69.0 in | Wt 275.8 lb

## 2019-02-11 DIAGNOSIS — E7800 Pure hypercholesterolemia, unspecified: Secondary | ICD-10-CM

## 2019-02-11 DIAGNOSIS — R0789 Other chest pain: Secondary | ICD-10-CM

## 2019-02-11 DIAGNOSIS — I10 Essential (primary) hypertension: Secondary | ICD-10-CM

## 2019-02-11 DIAGNOSIS — R072 Precordial pain: Secondary | ICD-10-CM | POA: Diagnosis not present

## 2019-02-11 MED ORDER — METOPROLOL TARTRATE 25 MG PO TABS: ORAL_TABLET | ORAL | 3 refills | Status: DC

## 2019-02-11 MED ORDER — METOPROLOL TARTRATE 25 MG PO TABS: ORAL_TABLET | ORAL | 0 refills | Status: DC

## 2019-02-11 NOTE — Progress Notes (Signed)
Cardiology Office Note:    Date:  02/11/2019   ID:  Joel Bennett, DOB 21-Aug-1965, MRN [id number]  PCP:  Ria Bush, MD  Cardiologist:  Kate Sable, MD  Electrophysiologist:  None   Referring MD: Ria Bush, MD   Chief Complaint  Patient presents with  . other    Ref by Dr. Danise Mina for chest pain. Meds reviewed by the pt. verbally. Pt. c/o chest pain and shortness of breath.     Joel Bennett is a 53 y.o. male who is being seen today for the evaluation of chest pain at the request of Ria Bush, MD.   History of Present Illness:    Joel Bennett is a 53 y.o. male with a hx of diabetes, hypertension, hyperlipidemia who presents due to chest pain.  Patient states symptoms have been ongoing for about 2 weeks now.  Symptoms are not related with exertion.  At symptom onset 2 weeks ago, patient was asleep and chest pain which he rates as a 5 out of 10 woke him up and lasted all day.  Pain radiates to his left shoulder and left arm.  A week later he followed up with his primary care physician and while at that visit, patient states having the pain.  His primary care provider gave him some nitroglycerin which improved his symptoms.  He has tried Tylenol and ibuprofen previously for the pain but that never helps.  2 days ago he went to the emergency room due to chest discomfort again while he was asleep.  High-sensitivity troponins were within normal limits.  EKG did not show any ST changes.  Patient denies ever smoking.  Denies any other cardiac history.  He was recently started on Lipitor for hyperlipidemia.  He was also encouraged to take baby aspirin.  He had a nuclear pharmacologic stress test 3 years ago which was normal.  Past Medical History:  Diagnosis Date  . Allergic rhinitis   . Allergy   . DDD (degenerative disc disease), lumbar   . Diabetes mellitus without complication ([place])   . Diabetes type 2, uncontrolled ([place]) 2012  . Gout   . History of  depression 2000   treated with meds  . Hypertension 1994  . Stool incontinence 12/2011   neg giardia, neg O&P, neg iFOB from prior PCP    Past Surgical History:  Procedure Laterality Date  . ANKLE SURGERY Left 1999  . BACK SURGERY  1997   Lumbar  . CARDIOVASCULAR STRESS TEST  08/2015   WNL (Dr Oval Linsey)  . COLONOSCOPY  05/2014   TA several, rpt 3 yrs Ardis Hughs)  . KNEE SURGERY Right 1985  . SHOULDER SURGERY Left 2002   x 2  . Thumb Surgery Left    Joint Fusion on left hand    Current Medications: Current Meds  Medication Sig  . allopurinol (ZYLOPRIM) 100 MG tablet Take 100 mg by mouth daily.  Marland Kitchen aspirin EC 81 MG tablet Take 1 tablet (81 mg total) by mouth daily.  Marland Kitchen atorvastatin (LIPITOR) 20 MG tablet Take 1 tablet (20 mg total) by mouth daily.  Marland Kitchen glimepiride (AMARYL) 2 MG tablet Take 1 tablet (2 mg total) by mouth daily before breakfast.  . glucose blood (FREESTYLE LITE) test strip Use as instructed to check sugar 1-2 times daily as needed. Dx: E11.65  . indomethacin (INDOCIN) 50 MG capsule TAKE 1 CAPSULE (50 MG TOTAL) BY MOUTH 3 (THREE) TIMES DAILY WITH MEALS. (Patient taking differently: As needed)  . meloxicam (MOBIC) 7.5  MG tablet Take two tablets daily.  . metFORMIN (GLUCOPHAGE-XR) 500 MG 24 hr tablet Take 2 tablets (1,000 mg total) by mouth daily with breakfast.  . olmesartan (BENICAR) 40 MG tablet Take 1 tablet (40 mg total) by mouth daily.     Allergies:   Ozempic (0.25 or 0.5 mg-dose) [semaglutide(0.25 or 0.5mg -dos)]   Social History   Socioeconomic History  . Marital status: Married    Spouse name: Not on file  . Number of children: Not on file  . Years of education: Not on file  . Highest education level: Not on file  Occupational History  . Not on file  Social Needs  . Financial resource strain: Not on file  . Food insecurity    Worry: Not on file    Inability: Not on file  . Transportation needs    Medical: Not on file    Non-medical: Not on file   Tobacco Use  . Smoking status: Never Smoker  . Smokeless tobacco: Current User    Types: Snuff  Substance and Sexual Activity  . Alcohol use: Yes    Alcohol/week: 0.0 standard drinks    Comment: occasional  . Drug use: No  . Sexual activity: Yes  Lifestyle  . Physical activity    Days per week: Not on file    Minutes per session: Not on file  . Stress: Not on file  Relationships  . Social Herbalist on phone: Not on file    Gets together: Not on file    Attends religious service: Not on file    Active member of club or organization: Not on file    Attends meetings of clubs or organizations: Not on file    Relationship status: Not on file  Other Topics Concern  . Not on file  Social History Narrative   Lives with wife   Grown children   Occ: Database administrator   Edu: HS   Activity: no regular exercise   Diet: good water, fruits/vegetables daily, red meat regularly     Family History: The patient's family history includes Alcohol abuse in his maternal grandfather, paternal grandfather, and paternal uncle; Atrial fibrillation in his father; CAD in his paternal grandmother and paternal uncle; CAD (age of onset: 72) in his paternal aunt; Cancer in his maternal aunt, maternal aunt, mother, paternal grandmother, and another family member; Diabetes in his cousin and father; Rectal cancer in his maternal aunt; Stroke in his cousin and maternal grandmother. There is no history of Colon polyps, Esophageal cancer, or Stomach cancer.  ROS:   Please see the history of present illness.     All other systems reviewed and are negative.  EKGs/Labs/Other Studies Reviewed:    The following studies were reviewed today: Lexiscan myocardial perfusion stress test 08/23/2015  The left ventricular ejection fraction is normal (55-65%).  Nuclear stress EF: 57%.  There was no ST segment deviation noted during stress.  The study is normal.  This is a low risk study.  EKG:  EKG is   ordered today.  The ekg ordered today demonstrates normal sinus rhythm, nonspecific T wave change.  Recent Labs: 08/10/2018: TSH 1.51 11/16/2018: ALT 26 02/09/2019: BUN 12; Creatinine, Ser 1.02; Hemoglobin 14.6; Platelets 178; Potassium 3.8; Sodium 139  Recent Lipid Panel    Component Value Date/Time   CHOL 167 10/28/2018 0917   TRIG 184.0 (H) 10/28/2018 0917   HDL 36.40 (L) 10/28/2018 0917   CHOLHDL 5 10/28/2018 [id number]  VLDL 36.8 10/28/2018 0917   LDLCALC 94 10/28/2018 0917   LDLDIRECT 97.0 08/10/2018 0843    Physical Exam:    VS:  BP (!) 144/88 (BP Location: Right Arm, Patient Position: Sitting, Cuff Size: Normal)   Pulse 65   Temp (!) 96.4 F (35.8 C)   Ht 5\' 9"  (1.753 m)   Wt 275 lb 12 oz (125.1 kg)   BMI 40.72 kg/m     Wt Readings from Last 3 Encounters:  02/11/19 275 lb 12 oz (125.1 kg)  02/09/19 279 lb 15.8 oz (127 kg)  02/04/19 280 lb 4 oz (127.1 kg)     GEN:  Well nourished, well developed in no acute distress, obese HEENT: Normal NECK: No JVD; No carotid bruits LYMPHATICS: No lymphadenopathy CARDIAC: RRR, no murmurs, rubs, gallops RESPIRATORY:  Clear to auscultation without rales, wheezing or rhonchi  ABDOMEN: Soft, non-tender, non-distended MUSCULOSKELETAL:  No edema; No deformity  SKIN: Warm and dry NEUROLOGIC:  Alert and oriented x 3 PSYCHIATRIC:  Normal affect   ASSESSMENT:   Patient with symptoms of atypical chest pain but concerning due to his risk factors of hypertension, hyperlipidemia, diabetes, obesity, and also improvement of pain with nitroglycerin.  He has had a Lexiscan stress test 3 years ago which was normal.  We will place patient has an intermediate to high risk for coronary artery disease. 1. Atypical chest pain   2. Essential hypertension   3. Pure hypercholesterolemia   4. Precordial pain    PLAN:    In order of problems listed above:  1. Get coronary CTA to evaluate coronary arteries.  Get echocardiogram.  Continue aspirin 81 mg  and Lipitor as currently prescribed. 2. Continue ACE inhibitor for blood pressure control. 3. Continue Lipitor.  Follow up after above tests.  Total encounter time more than 60 minutes  Greater than 50% was spent in counseling and coordination of care with the patient  This note was generated in part or whole with voice recognition software. Voice recognition is usually quite accurate but there are transcription errors that can and very often do occur. I apologize for any typographical errors that were not detected and corrected.    Medication Adjustments/Labs and Tests Ordered: Current medicines are reviewed at length with the patient today.  Concerns regarding medicines are outlined above.  Orders Placed This Encounter  Procedures  . CT CORONARY MORPH W/CTA COR W/SCORE W/CA W/CM &/OR WO/CM  . CT CORONARY FRACTIONAL FLOW RESERVE DATA PREP  . CT CORONARY FRACTIONAL FLOW RESERVE FLUID ANALYSIS  . EKG 12-Lead  . ECHOCARDIOGRAM COMPLETE   Meds ordered this encounter  Medications  . metoprolol tartrate (LOPRESSOR) 25 MG tablet    Sig: Take 4 tablets (100 mg) by mouth x 1 dose 2 hours prior to your Cardiac CT    Dispense:  180 tablet    Refill:  3    Please disregard the RX previously sent in for metoprolol tartrate 25 mg- take 2 tablets (50 mg) 2 hours prior- he will need the higher dose of his CT.    Patient Instructions  Medication Instructions:  - Your physician recommends that you continue on your current medications as directed. Please refer to the Current Medication list given to you today.  *If you need a refill on your cardiac medications before your next appointment, please call your pharmacy*  Lab Work: - none ordered  If you have labs (blood work) drawn today and your tests are completely normal, you will receive  your results only by: Marland Kitchen MyChart Message (if you have MyChart) OR . A paper copy in the mail If you have any lab test that is abnormal or we need to change  your treatment, we will call you to review the results.  Testing/Procedures: - Your physician has requested that you have an echocardiogram. Echocardiography is a painless test that uses sound waves to create images of your heart. It provides your doctor with information about the size and shape of your heart and how well your heart's chambers and valves are working. This procedure takes approximately one hour. There are no restrictions for this procedure.    - Your physician has recommended that you have a Cardiac CT  Your cardiac CT will be scheduled at one of the below locations:   [facility] [address] [place], [place] [address] [phone] [place] [address] [place], [place] [address] [phone]  If scheduled at [facility], please arrive at the [facility] main entrance of [facility] 30-45 minutes prior to test start time. Proceed to the [facility] Radiology Department (first floor) to check-in and test prep.  If scheduled at [facility], please arrive 15 mins early for check-in and test prep.  Please follow these instructions carefully (unless otherwise directed):  Hold all erectile dysfunction medications at least 3 days (72 hrs) prior to test.  On the Night Before the Test: . Be sure to Drink plenty of water. . Do not consume any caffeinated/decaffeinated beverages or chocolate 12 hours prior to your test. . Do not take any antihistamines 12 hours prior to your test.   On the Day of the Test: . Drink plenty of water. Do not drink any water within one hour of the test. . Do not eat any food 4 hours prior to the test. . You may take your regular medications prior to the test.  . Take metoprolol (Lopressor) 25 mg- take 4 tablets (100 mg) two hours prior to test. . HOLD Furosemide/Hydrochlorothiazide morning of the test. . FEMALES- please wear  underwire-free bra if available        After the Test: . Drink plenty of water. . After receiving IV contrast, you may experience a mild flushed feeling. This is normal. . On occasion, you may experience a mild rash up to 24 hours after the test. This is not dangerous. If this occurs, you can take Benadryl 25 mg and increase your fluid intake. . If you experience trouble breathing, this can be serious. If it is severe call 911 IMMEDIATELY. If it is mild, please call our office. . If you take any of these medications: Glipizide/Metformin, Avandament, Glucavance, please do not take 48 hours after completing test unless otherwise instructed.   Once we have confirmed authorization from your insurance company, we will call you to set up a date and time for your test.   For non-scheduling related questions, please contact the cardiac imaging nurse navigator should you have any questions/concerns: Marchia Bond, RN Navigator Cardiac Imaging [facility] [phone] Office   Follow-Up: At [facility], you and your health needs are our priority.  As part of our continuing mission to provide you with exceptional heart care, we have created designated Provider Care Teams.  These Care Teams include your primary Cardiologist (physician) and Advanced Practice Providers (APPs -  Physician Assistants and Nurse Practitioners) who all work together  to provide you with the care you need, when you need it.  Your next appointment:   4-6  week(s)/ after you Cardiac CT is done  The format for your next appointment:   In Person  Provider:   Kate Sable, MD  Other Instructions - n/a    Echocardiogram An echocardiogram is a procedure that uses painless sound waves (ultrasound) to produce an image of the heart. Images from an echocardiogram can provide important information about:  Signs of coronary artery disease (CAD).  Aneurysm detection. An aneurysm is a weak or  damaged part of an artery wall that bulges out from the normal force of blood pumping through the body.  Heart size and shape. Changes in the size or shape of the heart can be associated with certain conditions, including heart failure, aneurysm, and CAD.  Heart muscle function.  Heart valve function.  Signs of a past heart attack.  Fluid buildup around the heart.  Thickening of the heart muscle.  A tumor or infectious growth around the heart valves. Tell a health care provider about:  Any allergies you have.  All medicines you are taking, including vitamins, herbs, eye drops, creams, and over-the-counter medicines.  Any blood disorders you have.  Any surgeries you have had.  Any medical conditions you have.  Whether you are pregnant or may be pregnant. What are the risks? Generally, this is a safe procedure. However, problems may occur, including:  Allergic reaction to dye (contrast) that may be used during the procedure. What happens before the procedure? No specific preparation is needed. You may eat and drink normally. What happens during the procedure?   An IV tube may be inserted into one of your veins.  You may receive contrast through this tube. A contrast is an injection that improves the quality of the pictures from your heart.  A gel will be applied to your chest.  A wand-like tool (transducer) will be moved over your chest. The gel will help to transmit the sound waves from the transducer.  The sound waves will harmlessly bounce off of your heart to allow the heart images to be captured in real-time motion. The images will be recorded on a computer. The procedure may vary among health care providers and hospitals. What happens after the procedure?  You may return to your normal, everyday life, including diet, activities, and medicines, unless your health care provider tells you not to do that. Summary  An echocardiogram is a procedure that uses painless  sound waves (ultrasound) to produce an image of the heart.  Images from an echocardiogram can provide important information about the size and shape of your heart, heart muscle function, heart valve function, and fluid buildup around your heart.  You do not need to do anything to prepare before this procedure. You may eat and drink normally.  After the echocardiogram is completed, you may return to your normal, everyday life, unless your health care provider tells you not to do that. This information is not intended to replace advice given to you by your health care provider. Make sure you discuss any questions you have with your health care provider. Document Released: 03/07/2000 Document Revised: 07/01/2018 Document Reviewed: 04/12/2016 Elsevier Patient Education  2020 [place], Kate Sable, MD  02/11/2019 10:33 AM    [place]

## 2019-02-11 NOTE — Addendum Note (Signed)
Addended by: Alvis Lemmings C on: 02/11/2019 12:04 PM   Modules accepted: Orders

## 2019-02-11 NOTE — Patient Instructions (Addendum)
Medication Instructions:  - Your physician recommends that you continue on your current medications as directed. Please refer to the Current Medication list given to you today.  *If you need a refill on your cardiac medications before your next appointment, please call your pharmacy*  Lab Work: - none ordered  If you have labs (blood work) drawn today and your tests are completely normal, you will receive your results only by: Marland Kitchen MyChart Message (if you have MyChart) OR . A paper copy in the mail If you have any lab test that is abnormal or we need to change your treatment, we will call you to review the results.  Testing/Procedures: - Your physician has requested that you have an echocardiogram. Echocardiography is a painless test that uses sound waves to create images of your heart. It provides your doctor with information about the size and shape of your heart and how well your heart's chambers and valves are working. This procedure takes approximately one hour. There are no restrictions for this procedure.    - Your physician has recommended that you have a Cardiac CT  Your cardiac CT will be scheduled at one of the below locations:   [facility] [address] [place], [place] [address] [phone] [place] [address] [place], [place] [address] [phone]  If scheduled at [facility], please arrive at the [facility] main entrance of [facility] 30-45 minutes prior to test start time. Proceed to the [facility] Radiology Department (first floor) to check-in and test prep.  If scheduled at [facility], please arrive 15 mins early for check-in and test prep.  Please follow these instructions carefully (unless otherwise directed):  Hold all erectile dysfunction medications at least 3 days (72 hrs) prior to test.  On the Night Before the Test: . Be sure to  Drink plenty of water. . Do not consume any caffeinated/decaffeinated beverages or chocolate 12 hours prior to your test. . Do not take any antihistamines 12 hours prior to your test.   On the Day of the Test: . Drink plenty of water. Do not drink any water within one hour of the test. . Do not eat any food 4 hours prior to the test. . You may take your regular medications prior to the test.  . Take metoprolol (Lopressor) 25 mg- take 4 tablets (100 mg) two hours prior to test. . HOLD Furosemide/Hydrochlorothiazide morning of the test. . FEMALES- please wear underwire-free bra if available        After the Test: . Drink plenty of water. . After receiving IV contrast, you may experience a mild flushed feeling. This is normal. . On occasion, you may experience a mild rash up to 24 hours after the test. This is not dangerous. If this occurs, you can take Benadryl 25 mg and increase your fluid intake. . If you experience trouble breathing, this can be serious. If it is severe call 911 IMMEDIATELY. If it is mild, please call our office. . If you take any of these medications: Glipizide/Metformin, Avandament, Glucavance, please do not take 48 hours after completing test unless otherwise instructed.   Once we have confirmed authorization from your insurance company, we will call you to set up a date and time for your test.   For non-scheduling related questions, please contact the cardiac imaging nurse navigator should you have any questions/concerns: Marchia Bond, RN Navigator Cardiac Imaging  [facility] [phone] Office   Follow-Up: At [facility], you and your health needs are our priority.  As part of our continuing mission to provide you with exceptional heart care, we have created designated Provider Care Teams.  These Care Teams include your primary Cardiologist (physician) and Advanced Practice Providers (APPs -  Physician Assistants and Nurse  Practitioners) who all work together to provide you with the care you need, when you need it.  Your next appointment:   4-6  week(s)/ after you Cardiac CT is done  The format for your next appointment:   In Person  Provider:   Kate Sable, MD  Other Instructions - n/a    Echocardiogram An echocardiogram is a procedure that uses painless sound waves (ultrasound) to produce an image of the heart. Images from an echocardiogram can provide important information about:  Signs of coronary artery disease (CAD).  Aneurysm detection. An aneurysm is a weak or damaged part of an artery wall that bulges out from the normal force of blood pumping through the body.  Heart size and shape. Changes in the size or shape of the heart can be associated with certain conditions, including heart failure, aneurysm, and CAD.  Heart muscle function.  Heart valve function.  Signs of a past heart attack.  Fluid buildup around the heart.  Thickening of the heart muscle.  A tumor or infectious growth around the heart valves. Tell a health care provider about:  Any allergies you have.  All medicines you are taking, including vitamins, herbs, eye drops, creams, and over-the-counter medicines.  Any blood disorders you have.  Any surgeries you have had.  Any medical conditions you have.  Whether you are pregnant or may be pregnant. What are the risks? Generally, this is a safe procedure. However, problems may occur, including:  Allergic reaction to dye (contrast) that may be used during the procedure. What happens before the procedure? No specific preparation is needed. You may eat and drink normally. What happens during the procedure?   An IV tube may be inserted into one of your veins.  You may receive contrast through this tube. A contrast is an injection that improves the quality of the pictures from your heart.  A gel will be applied to your chest.  A wand-like tool  (transducer) will be moved over your chest. The gel will help to transmit the sound waves from the transducer.  The sound waves will harmlessly bounce off of your heart to allow the heart images to be captured in real-time motion. The images will be recorded on a computer. The procedure may vary among health care providers and hospitals. What happens after the procedure?  You may return to your normal, everyday life, including diet, activities, and medicines, unless your health care provider tells you not to do that. Summary  An echocardiogram is a procedure that uses painless sound waves (ultrasound) to produce an image of the heart.  Images from an echocardiogram can provide important information about the size and shape of your heart, heart muscle function, heart valve function, and fluid buildup around your heart.  You do not need to do anything to prepare before this procedure. You may eat and drink normally.  After the echocardiogram is completed, you may return to your normal, everyday life, unless your health care provider tells you not to do that. This information is not intended to replace advice given to you by your health care provider. Make sure  you discuss any questions you have with your health care provider. Document Released: 03/07/2000 Document Revised: 07/01/2018 Document Reviewed: 04/12/2016 Elsevier Patient Education  2020 [employer].

## 2019-02-11 NOTE — Telephone Encounter (Signed)
Joel Bennett from [facility] calling Needing to clarify metoprolol tartrate prescription  Please call Joel Bennett at [phone]

## 2019-02-11 NOTE — Telephone Encounter (Signed)
I called and spoke with the pharmacy. Metoprolol RX clarified- # 180 tabs were sent in on the original RX. I advised that was incorrect and her just needed # 4 tablets w/ no refills. RX is for metoprolol tartrate 25 mg- take 4 tablets (100 mg) by mouth x 1 dose 2 hours prior to his Cardiac CT.

## 2019-02-15 ENCOUNTER — Telehealth (HOSPITAL_COMMUNITY): Payer: Self-pay | Admitting: Emergency Medicine

## 2019-02-15 DIAGNOSIS — M0609 Rheumatoid arthritis without rheumatoid factor, multiple sites: Secondary | ICD-10-CM | POA: Diagnosis not present

## 2019-02-15 DIAGNOSIS — M150 Primary generalized (osteo)arthritis: Secondary | ICD-10-CM | POA: Diagnosis not present

## 2019-02-15 DIAGNOSIS — M2550 Pain in unspecified joint: Secondary | ICD-10-CM | POA: Diagnosis not present

## 2019-02-15 DIAGNOSIS — E790 Hyperuricemia without signs of inflammatory arthritis and tophaceous disease: Secondary | ICD-10-CM | POA: Diagnosis not present

## 2019-02-15 NOTE — Telephone Encounter (Signed)
Reaching out to patient to offer assistance regarding upcoming cardiac imaging study; pt verbalizes understanding of appt date/time, parking situation and where to check in, pre-test NPO status and medications ordered, and verified current allergies; name and call back number provided for further questions should they arise Zayley Arras RN Navigator Cardiac [facility] [phone] office [phone] cell 

## 2019-02-16 ENCOUNTER — Other Ambulatory Visit: Payer: Self-pay

## 2019-02-16 ENCOUNTER — Ambulatory Visit
Admission: RE | Admit: 2019-02-16 | Discharge: 2019-02-16 | Disposition: A | Discharge status: Home / Self Care | Payer: BC Managed Care – PPO | Source: Ambulatory Visit | Attending: Cardiology | Admitting: Cardiology

## 2019-02-16 DIAGNOSIS — R072 Precordial pain: Secondary | ICD-10-CM | POA: Diagnosis not present

## 2019-02-16 MED ORDER — IOHEXOL 350 MG/ML SOLN
100.0000 mL | Freq: Once | INTRAVENOUS | Status: AC | PRN
  Administered 2019-02-16: 100 mL via INTRAVENOUS

## 2019-02-16 MED ORDER — NITROGLYCERIN 0.4 MG SL SUBL
0.8000 mg | SUBLINGUAL_TABLET | Freq: Once | SUBLINGUAL | Status: AC
  Administered 2019-02-16: 13:00:00 0.8 mg via SUBLINGUAL

## 2019-02-16 NOTE — Progress Notes (Signed)
Patient ID: Joel Bennett, male   DOB: [birth year], 53 y.o.   MRN: [id number]   Pt completed CCTA without incident; pt denies lightheadedness, dizziness; pt offered refreshments-refused; pt ambulatroy to lobby with steady gait

## 2019-02-21 ENCOUNTER — Encounter: Payer: Self-pay | Admitting: Family Medicine

## 2019-03-17 ENCOUNTER — Ambulatory Visit (INDEPENDENT_AMBULATORY_CARE_PROVIDER_SITE_OTHER): Payer: BC Managed Care – PPO

## 2019-03-17 ENCOUNTER — Other Ambulatory Visit: Payer: Self-pay

## 2019-03-17 DIAGNOSIS — R0789 Other chest pain: Secondary | ICD-10-CM

## 2019-03-22 ENCOUNTER — Encounter: Payer: Self-pay | Admitting: Cardiology

## 2019-03-22 ENCOUNTER — Ambulatory Visit (INDEPENDENT_AMBULATORY_CARE_PROVIDER_SITE_OTHER): Payer: BC Managed Care – PPO | Admitting: Cardiology

## 2019-03-22 ENCOUNTER — Other Ambulatory Visit: Payer: Self-pay

## 2019-03-22 VITALS — BP 130/88 | HR 71 | Ht 69.0 in | Wt 274.5 lb

## 2019-03-22 DIAGNOSIS — E7800 Pure hypercholesterolemia, unspecified: Secondary | ICD-10-CM

## 2019-03-22 DIAGNOSIS — I10 Essential (primary) hypertension: Secondary | ICD-10-CM

## 2019-03-22 DIAGNOSIS — I2510 Atherosclerotic heart disease of native coronary artery without angina pectoris: Secondary | ICD-10-CM | POA: Diagnosis not present

## 2019-03-22 MED ORDER — ATORVASTATIN CALCIUM 40 MG PO TABS: 40.0000 mg | ORAL_TABLET | Freq: Every day | ORAL | 3 refills | Status: DC

## 2019-03-22 MED ORDER — ISOSORBIDE MONONITRATE ER 30 MG PO TB24: 30.0000 mg | ORAL_TABLET | Freq: Every day | ORAL | 3 refills | Status: DC

## 2019-03-22 NOTE — Patient Instructions (Signed)
Medication Instructions:  Your physician has recommended you make the following change in your medication:  1- INCREASE Lipitor to 40 mg by mouth once a day. 2- START Imdur 30 mg by mouth once a day.  *If you need a refill on your cardiac medications before your next appointment, please call your pharmacy*  Lab Work: none If you have labs (blood work) drawn today and your tests are completely normal, you will receive your results only by: Marland Kitchen MyChart Message (if you have MyChart) OR . A paper copy in the mail If you have any lab test that is abnormal or we need to change your treatment, we will call you to review the results.  Testing/Procedures: none  Follow-Up: At [facility], you and your health needs are our priority.  As part of our continuing mission to provide you with exceptional heart care, we have created designated Provider Care Teams.  These Care Teams include your primary Cardiologist (physician) and Advanced Practice Providers (APPs -  Physician Assistants and Nurse Practitioners) who all work together to provide you with the care you need, when you need it.  Your next appointment:   2 month(s)  The format for your next appointment:   In Person  Provider:    You may see Kate Sable, MD or one of the following Advanced Practice Providers on your designated Care Team:    Murray Hodgkins, NP  Christell Faith, PA-C  Marrianne Mood, PA-C

## 2019-03-22 NOTE — Progress Notes (Signed)
Cardiology Office Note:    Date:  03/22/2019   ID:  [name], DOB [birth year], MRN [id number]  PCP:  Ria Bush, MD  Cardiologist:  Kate Sable, MD  Electrophysiologist:  None   Referring MD: Ria Bush, MD   Chief Complaint  Patient presents with  . other    6 wk f/u echo and CT no complaints today. Meds reviewed verbally with pt.    Joel Bennett is a 53 y.o. male who is being seen today for the evaluation of chest pain at the request of Ria Bush, MD.   History of Present Illness:    Joel Bennett is a 53 y.o. male with a hx of diabetes, hypertension, hyperlipidemia who presents for follow-up.  He was originally seen due to chest pain.    Patient had chest pain symptoms for 2 weeks.  Symptoms were atypical, resolving with nitroglycerin.  Due to risk factors and family history echo and coronary CTA was ordered.  He previously had a pharmacologic nuclear stress test 3 years which was normal.  She states having occasional chest discomfort.  Nitroglycerin sometimes helps.  Past Medical History:  Diagnosis Date  . Allergic rhinitis   . Allergy   . DDD (degenerative disc disease), lumbar   . Diabetes mellitus without complication ([place])   . Diabetes type 2, uncontrolled ([place]) 2012  . Gout   . History of depression 2000   treated with meds  . Hypertension 1994  . Stool incontinence 12/2011   neg giardia, neg O&P, neg iFOB from prior PCP    Past Surgical History:  Procedure Laterality Date  . ANKLE SURGERY Left 1999  . BACK SURGERY  1997   Lumbar  . CARDIOVASCULAR STRESS TEST  08/2015   WNL (Dr Oval Linsey)  . COLONOSCOPY  05/2014   TA several, rpt 3 yrs Ardis Hughs)  . COLONOSCOPY  11/2018   TA, rpt 7 yrs Ardis Hughs)  . KNEE SURGERY Right 1985  . SHOULDER SURGERY Left 2002   x 2  . Thumb Surgery Left    Joint Fusion on left hand    Current Medications: Current Meds  Medication Sig  . allopurinol (ZYLOPRIM) 100 MG tablet Take 100 mg  by mouth daily.  Marland Kitchen aspirin EC 81 MG tablet Take 1 tablet (81 mg total) by mouth daily.  Marland Kitchen glimepiride (AMARYL) 2 MG tablet Take 1 tablet (2 mg total) by mouth daily before breakfast.  . glucose blood (FREESTYLE LITE) test strip Use as instructed to check sugar 1-2 times daily as needed. Dx: E11.65  . indomethacin (INDOCIN) 50 MG capsule TAKE 1 CAPSULE (50 MG TOTAL) BY MOUTH 3 (THREE) TIMES DAILY WITH MEALS. (Patient taking differently: As needed)  . olmesartan (BENICAR) 40 MG tablet Take 1 tablet (40 mg total) by mouth daily.  . [DISCONTINUED] atorvastatin (LIPITOR) 20 MG tablet Take 1 tablet (20 mg total) by mouth daily.     Allergies:   Ozempic (0.25 or 0.5 mg-dose) [semaglutide(0.25 or 0.5mg -dos)]   Social History   Socioeconomic History  . Marital status: Married    Spouse name: Not on file  . Number of children: Not on file  . Years of education: Not on file  . Highest education level: Not on file  Occupational History  . Not on file  Tobacco Use  . Smoking status: Never Smoker  . Smokeless tobacco: Current User    Types: Snuff  Substance and Sexual Activity  . Alcohol use: Yes    Alcohol/week: 0.0 standard drinks  Comment: occasional  . Drug use: No  . Sexual activity: Yes  Other Topics Concern  . Not on file  Social History Narrative   Lives with wife   Grown children   Occ: Database administrator   Edu: HS   Activity: no regular exercise   Diet: good water, fruits/vegetables daily, red meat regularly   Social Determinants of Health   Financial Resource Strain:   . Difficulty of Paying Living Expenses: Not on file  Food Insecurity:   . Worried About Charity fundraiser in the Last Year: Not on file  . Ran Out of Food in the Last Year: Not on file  Transportation Needs:   . Lack of Transportation (Medical): Not on file  . Lack of Transportation (Non-Medical): Not on file  Physical Activity:   . Days of Exercise per Week: Not on file  . Minutes of Exercise per  Session: Not on file  Stress:   . Feeling of Stress : Not on file  Social Connections:   . Frequency of Communication with Friends and Family: Not on file  . Frequency of Social Gatherings with Friends and Family: Not on file  . Attends Religious Services: Not on file  . Active Member of Clubs or Organizations: Not on file  . Attends Archivist Meetings: Not on file  . Marital Status: Not on file     Family History: The patient's family history includes Alcohol abuse in his maternal grandfather, paternal grandfather, and paternal uncle; Atrial fibrillation in his father; CAD in his paternal grandmother and paternal uncle; CAD (age of onset: 80) in his paternal aunt; Cancer in his maternal aunt, maternal aunt, mother, paternal grandmother, and another family member; Diabetes in his cousin and father; Rectal cancer in his maternal aunt; Stroke in his cousin and maternal grandmother. There is no history of Colon polyps, Esophageal cancer, or Stomach cancer.  ROS:   Please see the history of present illness.     All other systems reviewed and are negative.  EKGs/Labs/Other Studies Reviewed:    The following studies were reviewed today:  TTE 03/17/2019  1. Left ventricular ejection fraction, by visual estimation, is 60 to 65%. The left ventricle has normal function. There is mildly increased left ventricular hypertrophy.  2. The left ventricle has no regional wall motion abnormalities.  3. Global right ventricle has normal systolic function.The right ventricular size is normal. No increase in right ventricular wall thickness.  4. Left atrial size was mildly dilated  5. There is mild dilatation of the aortic root, 4.2 cm, ascending aorta is 3.6 cm.  6. Aortic valve not well visualized.  7. Normal pulmonary artery systolic pressure  Coronary CTA date 02/16/2019 IMPRESSION: 1. Coronary calcium score of 735. This was 12 percentile for age and sex matched control.  2. Normal  coronary origin with left dominance.  3. Mild CAD with 25-49% calcified stenosis in the proximal and mid LAD (CADRADS-2).  4. See separate report from radiology for noncardiac structures   Lexiscan myocardial perfusion stress test 08/23/2015  The left ventricular ejection fraction is normal (55-65%).  Nuclear stress EF: 57%.  There was no ST segment deviation noted during stress.  The study is normal.  This is a low risk study.  EKG:  EKG is  ordered today.  The ekg ordered today demonstrates normal sinus rhythm, nonspecific T wave change.  Recent Labs: 08/10/2018: TSH 1.51 11/16/2018: ALT 26 02/09/2019: BUN 12; Creatinine, Ser 1.02; Hemoglobin 14.6; Platelets  178; Potassium 3.8; Sodium 139  Recent Lipid Panel    Component Value Date/Time   CHOL 167 10/28/2018 0917   TRIG 184.0 (H) 10/28/2018 0917   HDL 36.40 (L) 10/28/2018 0917   CHOLHDL 5 10/28/2018 0917   VLDL 36.8 10/28/2018 0917   LDLCALC 94 10/28/2018 0917   LDLDIRECT 97.0 08/10/2018 0843    Physical Exam:    VS:  BP 130/88 (BP Location: Left Arm, Patient Position: Sitting, Cuff Size: Large)   Pulse 71   Ht 5\' 9"  (1.753 m)   Wt 274 lb 8 oz (124.5 kg)   SpO2 98%   BMI 40.54 kg/m     Wt Readings from Last 3 Encounters:  03/22/19 274 lb 8 oz (124.5 kg)  02/11/19 275 lb 12 oz (125.1 kg)  02/09/19 279 lb 15.8 oz (127 kg)     GEN:  Well nourished, well developed in no acute distress, obese HEENT: Normal NECK: No JVD; No carotid bruits LYMPHATICS: No lymphadenopathy CARDIAC: RRR, no murmurs, rubs, gallops RESPIRATORY:  Clear to auscultation without rales, wheezing or rhonchi  ABDOMEN: Soft, non-tender, non-distended MUSCULOSKELETAL:  No edema; No deformity  SKIN: Warm and dry NEUROLOGIC:  Alert and oriented x 3 PSYCHIATRIC:  Normal affect   ASSESSMENT:   Patient with symptoms of atypical chest pain but concerning due to his risk factors of hypertension, hyperlipidemia, diabetes, obesity, and also  improvement of pain with nitroglycerin.  Echocardiogram shows normal systolic and diastolic function.  Mild LVH, mild aortic root dilatation measuring 4.2 cm.  Coronary CTA showed mild calcification in the proximal to mid LAD, coronary calcium score of 735.  Has occasional chest discomfort.  It is possible he may have microvascular disease especially as symptoms improve with nitroglycerin.  1. Coronary artery disease involving native heart, angina presence unspecified, unspecified vessel or lesion type   2. Essential hypertension   3. Pure hypercholesterolemia    PLAN:    In order of problems listed above:  1. 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  Coronary calcium 735.  Patient with occasional chest discomfort possibly has some microvascular disease.  Start Imdur 30 mg daily, continue aspirin 81 mg, Lipitor 40 mg daily. 2. Continue Benicar 40 mg daily,  3. Increase Lipitor to 40 mg daily.  Follow up in 2 months..  Total encounter time more than 35 minutes  Greater than 50% was spent in counseling and coordination of care with the patient  This note was generated in part or whole with voice recognition software. Voice recognition is usually quite accurate but there are transcription errors that can and very often do occur. I apologize for any typographical errors that were not detected and corrected.  Medication Adjustments/Labs and Tests Ordered: Current medicines are reviewed at length with the patient today.  Concerns regarding medicines are outlined above.  Orders Placed This Encounter  Procedures  . EKG 12-Lead   Meds ordered this encounter  Medications  . atorvastatin (LIPITOR) 40 MG tablet    Sig: Take 1 tablet (40 mg total) by mouth daily.    Dispense:  90 tablet    Refill:  3  . isosorbide mononitrate (IMDUR) 30 MG 24 hr tablet    Sig: Take 1 tablet (30 mg total) by mouth daily.    Dispense:  90 tablet    Refill:  3    Patient Instructions    Medication Instructions:  Your physician has recommended you make the following change in your medication:  1- INCREASE Lipitor to 40 mg by mouth once a day. 2- START Imdur 30 mg by mouth once a day.  *If you need a refill on your cardiac medications before your next appointment, please call your pharmacy*  Lab Work: none If you have labs (blood work) drawn today and your tests are completely normal, you will receive your results only by: Marland Kitchen MyChart Message (if you have MyChart) OR . A paper copy in the mail If you have any lab test that is abnormal or we need to change your treatment, we will call you to review the results.  Testing/Procedures: none  Follow-Up: At [facility], you and your health needs are our priority.  As part of our continuing mission to provide you with exceptional heart care, we have created designated Provider Care Teams.  These Care Teams include your primary Cardiologist (physician) and Advanced Practice Providers (APPs -  Physician Assistants and Nurse Practitioners) who all work together to provide you with the care you need, when you need it.  Your next appointment:   2 month(s)  The format for your next appointment:   In Person  Provider:    You may see Kate Sable, MD or one of the following Advanced Practice Providers on your designated Care Team:    Murray Hodgkins, NP  Christell Faith, PA-C  Marrianne Mood, PA-C     Signed, Kate Sable, MD  03/22/2019 5:07 PM    [place]

## 2019-03-29 DIAGNOSIS — M0609 Rheumatoid arthritis without rheumatoid factor, multiple sites: Secondary | ICD-10-CM | POA: Diagnosis not present

## 2019-03-29 LAB — CBC AND DIFFERENTIAL
Hemoglobin: 14.6 (ref 13.5–17.5)
Platelets: 200 (ref 150–399)
WBC: 7.3

## 2019-03-29 LAB — COMPREHENSIVE METABOLIC PANEL: Albumin: 4 (ref 3.5–5.0)

## 2019-03-29 LAB — HEPATIC FUNCTION PANEL
AST: 22 (ref 14–40)
Alkaline Phosphatase: 101 (ref 25–125)
Bilirubin, Total: 0.5

## 2019-03-29 LAB — BASIC METABOLIC PANEL
Creatinine: 0.9 (ref 0.6–1.3)
Glucose: 333
Potassium: 4 (ref 3.4–5.3)
Sodium: 138 (ref 137–147)

## 2019-03-31 ENCOUNTER — Encounter: Payer: Self-pay | Admitting: Family Medicine

## 2019-03-31 ENCOUNTER — Ambulatory Visit (INDEPENDENT_AMBULATORY_CARE_PROVIDER_SITE_OTHER): Payer: BC Managed Care – PPO | Admitting: Family Medicine

## 2019-03-31 VITALS — Ht 69.0 in

## 2019-03-31 DIAGNOSIS — Z20822 Contact with and (suspected) exposure to covid-19: Secondary | ICD-10-CM | POA: Diagnosis not present

## 2019-03-31 NOTE — Patient Instructions (Signed)
How to make an appointment for COVID testing  TEXT: COVID to [phone]  or CALL: [phone] or  ONLINE: [email or web address]  Locations:  [place]: [place] Pharmacist, hospital [place]: [place] at [employer]  M-F  8 AM to 3:30 PM Holiday Hours: New Year's Eve - 8 a.m. - noon New Year's Day - Closed   If your COVID test is POSITIVE you may return to work/school  if all of the following are true: 1.  10 days since symptom onset or positive COVID-19 test 2.    3 consecutive days without fever and without antipyretics 3.    You have symptom improvement [especially respiratory]).  If your COVID test is NEGATIVE you may return to work when you are 24 hour fever free and respiratory symptoms are resolved.     Person Under Monitoring Name: Joel Bennett  Location: [place] [address]   Infection Prevention Recommendations for Individuals Confirmed to have, or Being Evaluated for, 2019 Novel Coronavirus (COVID-19) Infection Who Receive Care at Home  Individuals who are confirmed to have, or are being evaluated for, COVID-19 should follow the prevention steps below until a healthcare provider or local or state health department says they can return to normal activities.  Stay home except to get medical care You should restrict activities outside your home, except for getting medical care. Do not go to work, school, or public areas, and do not use public transportation or taxis.  Call ahead before visiting your doctor Before your medical appointment, call the healthcare provider and tell them that you have, or are being evaluated for, COVID-19 infection. This will help the healthcare provider's office take steps to keep other people from getting infected. Ask your healthcare provider to call the local or state health department.  Monitor your symptoms Seek prompt medical attention if your illness is  worsening (e.g., difficulty breathing). Before going to your medical appointment, call the healthcare provider and tell them that you have, or are being evaluated for, COVID-19 infection. Ask your healthcare provider to call the local or state health department.  Wear a facemask You should wear a facemask that covers your nose and mouth when you are in the same room with other people and when you visit a healthcare provider. People who live with or visit you should also wear a facemask while they are in the same room with you.  Separate yourself from other people in your home As much as possible, you should stay in a different room from other people in your home. Also, you should use a separate bathroom, if available.  Avoid sharing household items You should not share dishes, drinking glasses, cups, eating utensils, towels, bedding, or other items with other people in your home. After using these items, you should wash them thoroughly with soap and water.  Cover your coughs and sneezes Cover your mouth and nose with a tissue when you cough or sneeze, or you can cough or sneeze into your sleeve. Throw used tissues in a lined trash can, and immediately wash your hands with soap and water for at least 20 seconds or use an alcohol-based hand rub.  Wash your [facility] your hands often and thoroughly with soap and water for at least 20 seconds. You can use an alcohol-based hand sanitizer if soap and water are not available and if your hands are not visibly dirty. Avoid touching your eyes, nose, and mouth  with unwashed hands.   Prevention Steps for Caregivers and Household Members of Individuals Confirmed to have, or Being Evaluated for, COVID-19 Infection Being Cared for in the Home  If you live with, or provide care at home for, a person confirmed to have, or being evaluated for, COVID-19 infection please follow these guidelines to prevent infection:  Follow healthcare provider's  instructions Make sure that you understand and can help the patient follow any healthcare provider instructions for all care.  Provide for the patient's basic needs You should help the patient with basic needs in the home and provide support for getting groceries, prescriptions, and other personal needs.  Monitor the patient's symptoms If they are getting sicker, call his or her medical provider and tell them that the patient has, or is being evaluated for, COVID-19 infection. This will help the healthcare provider's office take steps to keep other people from getting infected. Ask the healthcare provider to call the local or state health department.  Limit the number of people who have contact with the patient  If possible, have only one caregiver for the patient.  Other household members should stay in another home or place of residence. If this is not possible, they should stay  in another room, or be separated from the patient as much as possible. Use a separate bathroom, if available.  Restrict visitors who do not have an essential need to be in the home.  Keep older adults, very young children, and other sick people away from the patient Keep older adults, very young children, and those who have compromised immune systems or chronic health conditions away from the patient. This includes people with chronic heart, lung, or kidney conditions, diabetes, and cancer.  Ensure good ventilation Make sure that shared spaces in the home have good air flow, such as from an air conditioner or an opened window, weather permitting.  Wash your hands often  Wash your hands often and thoroughly with soap and water for at least 20 seconds. You can use an alcohol based hand sanitizer if soap and water are not available and if your hands are not visibly dirty.  Avoid touching your eyes, nose, and mouth with unwashed hands.  Use disposable paper towels to dry your hands. If not available, use  dedicated cloth towels and replace them when they become wet.  Wear a facemask and gloves  Wear a disposable facemask at all times in the room and gloves when you touch or have contact with the patient's blood, body fluids, and/or secretions or excretions, such as sweat, saliva, sputum, nasal mucus, vomit, urine, or feces.  Ensure the mask fits over your nose and mouth tightly, and do not touch it during use.  Throw out disposable facemasks and gloves after using them. Do not reuse.  Wash your hands immediately after removing your facemask and gloves.  If your personal clothing becomes contaminated, carefully remove clothing and launder. Wash your hands after handling contaminated clothing.  Place all used disposable facemasks, gloves, and other waste in a lined container before disposing them with other household waste.  Remove gloves and wash your hands immediately after handling these items.  Do not share dishes, glasses, or other household items with the patient  Avoid sharing household items. You should not share dishes, drinking glasses, cups, eating utensils, towels, bedding, or other items with a patient who is confirmed to have, or being evaluated for, COVID-19 infection.  After the person uses these items, you should wash  them thoroughly with soap and water.  Wash laundry thoroughly  Immediately remove and wash clothes or bedding that have blood, body fluids, and/or secretions or excretions, such as sweat, saliva, sputum, nasal mucus, vomit, urine, or feces, on them.  Wear gloves when handling laundry from the patient.  Read and follow directions on labels of laundry or clothing items and detergent. In general, wash and dry with the warmest temperatures recommended on the label.  Clean all areas the individual has used often  Clean all touchable surfaces, such as counters, tabletops, doorknobs, bathroom fixtures, toilets, phones, keyboards, tablets, and bedside tables, every  day. Also, clean any surfaces that may have blood, body fluids, and/or secretions or excretions on them.  Wear gloves when cleaning surfaces the patient has come in contact with.  Use a diluted bleach solution (e.g., dilute bleach with 1 part bleach and 10 parts water) or a household disinfectant with a label that says EPA-registered for coronaviruses. To make a bleach solution at home, add 1 tablespoon of bleach to 1 quart (4 cups) of water. For a larger supply, add  cup of bleach to 1 gallon (16 cups) of water.  Read labels of cleaning products and follow recommendations provided on product labels. Labels contain instructions for safe and effective use of the cleaning product including precautions you should take when applying the product, such as wearing gloves or eye protection and making sure you have good ventilation during use of the product.  Remove gloves and wash hands immediately after cleaning.  Monitor yourself for signs and symptoms of illness Caregivers and household members are considered close contacts, should monitor their health, and will be asked to limit movement outside of the home to the extent possible. Follow the monitoring steps for close contacts listed on the symptom monitoring form.   ? If you have additional questions, contact your local health department or call the epidemiologist on call at [phone] (available 24/7). ? This guidance is subject to change. For the most up-to-date guidance from [facility], please refer to their website: [email or web address]

## 2019-03-31 NOTE — Progress Notes (Signed)
VIRTUAL VISIT Due to national recommendations of social distancing due to [place] 19, a virtual visit is felt to be most appropriate for this patient at this time.   I connected with the patient on 03/31/19 at  2:00 PM EST by virtual telehealth platform and verified that I am speaking with the correct person using two identifiers.   I discussed the limitations, risks, security and privacy concerns of performing an evaluation and management service by  virtual telehealth platform and the availability of in person appointments. I also discussed with the patient that there may be a patient responsible charge related to this service. The patient expressed understanding and agreed to proceed.  Patient location: Home Provider Location: [facility] Participants: Eliezer Lofts and [name]   Chief Complaint  Patient presents with  . Nasal Congestion  . Cough  . Sinus Drainage    History of Present Illness: 54 year old male pt of Dr. Danise Mina with history of allergies and diabetes mellitus presents with new onset in last week of nasal congestion, sinus pressure, occ sinus pain. He is coughing productive.  No SOB, no wheezing.  No fever, intermittant ST, no ear pain.   no los of taste or smell, no diarrhea.   He has been using  mucinex Dm and nyquil.  Moderate cough control at night.  No sick contacts.   COVID 19 screen No recent travel or known exposure to [place] The importance of social distancing was discussed today.   Review of Systems  Constitutional: Negative for chills and fever.  HENT: Positive for congestion, sinus pain and sore throat. Negative for ear pain.   Eyes: Negative for pain and redness.  Respiratory: Positive for cough. Negative for shortness of breath. Sputum production: sus covid\   Cardiovascular: Negative for chest pain, palpitations and leg swelling.  Gastrointestinal: Negative for abdominal pain, blood in stool, constipation, diarrhea, nausea and  vomiting.  Genitourinary: Negative for dysuria.  Musculoskeletal: Negative for falls and myalgias.  Skin: Negative for rash.  Neurological: Negative for dizziness.  Psychiatric/Behavioral: Negative for depression. The patient is not nervous/anxious.       Past Medical History:  Diagnosis Date  . Allergic rhinitis   . Allergy   . DDD (degenerative disc disease), lumbar   . Diabetes mellitus without complication ([place])   . Diabetes type 2, uncontrolled ([place]) 2012  . Gout   . History of depression 2000   treated with meds  . Hypertension 1994  . Stool incontinence 12/2011   neg giardia, neg O&P, neg iFOB from prior PCP    reports that he has never smoked. His smokeless tobacco use includes snuff. He reports current alcohol use. He reports that he does not use drugs.   Current Outpatient Medications:  .  aspirin EC 81 MG tablet, Take 1 tablet (81 mg total) by mouth daily., Disp:  , Rfl:  .  atorvastatin (LIPITOR) 40 MG tablet, Take 1 tablet (40 mg total) by mouth daily., Disp: 90 tablet, Rfl: 3 .  folic acid (FOLVITE) 1 MG tablet, Take 1 mg by mouth daily., Disp: , Rfl:  .  glimepiride (AMARYL) 2 MG tablet, Take 1 tablet (2 mg total) by mouth daily before breakfast., Disp: 90 tablet, Rfl: 3 .  glucose blood (FREESTYLE LITE) test strip, Use as instructed to check sugar 1-2 times daily as needed. Dx: E11.65, Disp: 100 each, Rfl: 3 .  indomethacin (INDOCIN) 50 MG capsule, Take 50 mg by mouth 3 (three) times daily  as needed., Disp: , Rfl:  .  isosorbide mononitrate (IMDUR) 30 MG 24 hr tablet, Take 1 tablet (30 mg total) by mouth daily., Disp: 90 tablet, Rfl: 3 .  metFORMIN (GLUCOPHAGE-XR) 500 MG 24 hr tablet, Take 2 tablets (1,000 mg total) by mouth daily with breakfast., Disp: 180 tablet, Rfl: 3 .  methotrexate (RHEUMATREX) 2.5 MG tablet, Take 15 mg by mouth once a week., Disp: , Rfl:  .  metoprolol tartrate (LOPRESSOR) 25 MG tablet, Take 25 mg by mouth 2 (two) times daily., Disp: , Rfl:   .  olmesartan (BENICAR) 40 MG tablet, Take 1 tablet (40 mg total) by mouth daily., Disp: 90 tablet, Rfl: 3   Observations/Objective: Height 5\' 9"  (1.753 m).  Physical Exam  Physical Exam Constitutional:      General: The patient is not in acute distress. Pulmonary:     Effort: Pulmonary effort is normal. No respiratory distress.  Neurological:     Mental Status: The patient is alert and oriented to person, place, and time.  Psychiatric:        Mood and Affect: Mood normal.        Behavior: Behavior normal.   Assessment and Plan   Suspected COVID-19 virus infection  Likely COVID19  infection vs other viral infection. No clear sign of bacterial infection at this time.  Recommend testing .. info on how to obtain testing provided through [facility]. No SOB.  No red flags/need for ER visit or in-person exam at respiratory clinic at this time..    Pt moderateto high risk for COVID complications given DM, HTN, CAD, sex, obesity and age. If SOB begins or symptoms worsening.. have low threshold for in-person exam, if severe shortness of breath ER visit recommended.  Can monitor Oxygen saturation at home with home monitor if able to obtain.  Go to ER if O2 sat < 90% on room air.  Reviewed home care and provided information through [facility].  Recommended isolation until test returns. If returns positive 10 days quarantine recommended.  Provided info about prevention of spread of COVID 19.   I discussed the assessment and treatment plan with the patient. The patient was provided an opportunity to ask questions and all were answered. The patient agreed with the plan and demonstrated an understanding of the instructions.   The patient was advised to call back or seek an in-person evaluation if the symptoms worsen or if the condition fails to improve as anticipated.     Eliezer Lofts, MD

## 2019-03-31 NOTE — Assessment & Plan Note (Addendum)
Likely COVID19  infection vs other viral infection. No clear sign of bacterial infection at this time.  Recommend testing .. info on how to obtain testing provided through [facility]. No SOB.  No red flags/need for ER visit or in-person exam at respiratory clinic at this time..    Pt moderateto high risk for COVID complications given DM, HTN, CAD, sex, obesity and age. If SOB begins or symptoms worsening.. have low threshold for in-person exam, if severe shortness of breath ER visit recommended.  Can monitor Oxygen saturation at home with home monitor if able to obtain.  Go to ER if O2 sat < 90% on room air.  Reviewed home care and provided information through [facility].  Recommended isolation until test returns. If returns positive 10 days quarantine recommended.  Provided info about prevention of spread of COVID 19.

## 2019-04-01 ENCOUNTER — Ambulatory Visit: Payer: BC Managed Care – PPO | Attending: Internal Medicine

## 2019-04-01 DIAGNOSIS — Z20822 Contact with and (suspected) exposure to covid-19: Secondary | ICD-10-CM

## 2019-04-03 LAB — NOVEL CORONAVIRUS, NAA: SARS-CoV-2, NAA: NOT DETECTED

## 2019-04-03 NOTE — Telephone Encounter (Signed)
rec add flonase. Will cc Dr Diona Browner who saw patient last week as well.

## 2019-04-19 ENCOUNTER — Encounter: Payer: Self-pay | Admitting: Family Medicine

## 2019-04-19 ENCOUNTER — Telehealth: Payer: Self-pay | Admitting: Family Medicine

## 2019-04-19 NOTE — Telephone Encounter (Signed)
plz notify I received labs from earlier this month (placed in Lisa's box to input)  Normal kidneys, liver, blood counts. Sugar was too high at 333 - is he taking glimepiride 2mg  and metformin XR 1000mg  with breakfast? May need to increase metformin dose or start 3rd diabetes medicine if sugars remaining uncontrolled. See if willing to do this, rec schedule in office DM f/u after 05/07/2019.

## 2019-04-19 NOTE — Telephone Encounter (Signed)
Spoke with pt relaying Dr. Synthia Innocent message.  Pt states he may not have been fasting at time of labs.  Scheduled DM f/u on 05/08/18 at 8:00.  FYI to Dr. Darnell Level.

## 2019-04-19 NOTE — Progress Notes (Signed)
Rochella Benner T. Izzak Fries, MD Primary Care and Sports Medicine [facility] at [facility] [place], [address] Phone: [phone]  FAX: [phone]  Joel Bennett - 54 y.o. male  MRN [id number]  Date of Birth: [birth year]  Visit Date: 04/21/2019  PCP: Ria Bush, MD  Referred by: Ria Bush, MD  Chief Complaint  Patient presents with  . Hand Pain    Left    This visit occurred during the SARS-CoV-2 public health emergency.  Safety protocols were in place, including screening questions prior to the visit, additional usage of staff PPE, and extensive cleaning of exam room while observing appropriate contact time as indicated for disinfecting solutions.   Subjective:   Joel Bennett is a 54 y.o. very pleasant male patient with Body mass index is 40.5 kg/m. who presents with the following:  CTS: The patent presents a > 1 year history of L hand numbness and tingling, greatest in medial aspect of hands, some in ulnar aspect. Some weakness with grip strength. Shome occ. pain going to forearm. Bothers the most at night. Aggravated by lifting arms above head and at night.   I have seen the gentleman in the past with some polymyalgia rheumatica, and I last saw him on April 2020.  At this point he is completely off of his steroids and he presents today for evaluation of shoulder pain  Numb at baseline, 1-3 on L  On MTX.  Splints? Yes, no improvement Prior Injecton: none EMG: none Hand of Dominance: R   Past Medical History, Surgical History, Social History, Family History, Problem List, Medications, and Allergies have been reviewed and updated if relevant.  GEN: no acute illness or fever CV: No chest pain or shortness of breath MSK: detailed above Neuro: neurological signs are described above ROS O/w per HPI  Objective:   BP 140/80   Pulse 67   Temp 98.3 F (36.8 C) (Temporal)   Ht 5\' 9"  (1.753 m)   Wt 274 lb 4 oz  (124.4 kg)   SpO2 95%   BMI 40.50 kg/m    GEN: WDWN, NAD, Non-toxic, Alert & Oriented x 3 HEENT: Atraumatic, Normocephalic.  Ears and Nose: No external deformity. EXTR: No clubbing/cyanosis/edema NEURO: Normal gait.  PSYCH: Normally interactive. Conversant. Not depressed or anxious appearing.  Calm demeanor.   Hand: L Ecchymosis or edema: neg ROM wrist/hand/digits/elbow: full  Carpals, MCP's, digits: NT Distal Ulna and Radius: NT Supination lift test: neg Ecchymosis or edema: neg Cysts/nodules: neg Finkelstein's test: neg Snuffbox tenderness: neg Scaphoid tubercle: NT Hook of Hamate: NT Resisted supination: NT Full composite fist Grip, all digits: 5/5 str No tenosynovitis Axial load test: neg Phalen's: + Tinel's: + Atrophy: neg  Hand sensation: intact   Radiology: No results found.  Assessment and Plan:     ICD-10-CM   1. Carpal tunnel syndrome of left wrist  G56.02 methylPREDNISolone acetate (DEPO-MEDROL) injection 20 mg   We discussed the anatomy involved, and that carpal tunnel syndrome primarily involves the median nerve, and this typically affects digits one through 3.   If the patient does have moderate to severe carpal tunnel syndrome based on NCV, then it is certainly reasonable to consider surgical consultation for definitive management possible carpal tunnel release.  We also discussed his severe carpal tunnel syndrome can lead to permanent nerve impairment even if released. At this point, the patient like to proceed conservatively.   Carpal Tunnel Injection Procedure Note Joel Bennett [birth year] Date of  procedure: 04/21/2019  Procedure: Carpal Tunnel Injection, L Indications: Numbness  Procedure Details Verbal consent was obtained. Risks (including rare risk of infection), benefits, and alternatives were discussed. Prepped with Chloraprep and Ethyl Chloride used for anesthesia. Under sterile conditions,  the patient was injected just ulnar to the  palmaris longus tendon at the wrist flexion crease.  The needle was inserted at 45 degree angle aiming distally. Aspiration showed no blood. Medication flowed freely without resistance.  Needle size: 22 gauge 1 1/2 inch Injection: 1/2 cc of Lidocaine 1% and Depo-Medrol 20 mg Medication: Depo-Medrol 20 mg   Follow-up: No follow-ups on file.  Meds ordered this encounter  Medications  . methylPREDNISolone acetate (DEPO-MEDROL) injection 20 mg   Modified Medications   No medications on file   No orders of the defined types were placed in this encounter.   Signed,  Joel Deed. Kehaulani Fruin, MD   Outpatient Encounter Medications as of 04/21/2019  Medication Sig  . aspirin EC 81 MG tablet Take 1 tablet (81 mg total) by mouth daily.  Marland Kitchen atorvastatin (LIPITOR) 40 MG tablet Take 1 tablet (40 mg total) by mouth daily.  . folic acid (FOLVITE) 1 MG tablet Take 1 mg by mouth daily.  Marland Kitchen glimepiride (AMARYL) 2 MG tablet Take 1 tablet (2 mg total) by mouth daily before breakfast.  . glucose blood (FREESTYLE LITE) test strip Use as instructed to check sugar 1-2 times daily as needed. Dx: E11.65  . indomethacin (INDOCIN) 50 MG capsule Take 50 mg by mouth 3 (three) times daily as needed.  . isosorbide mononitrate (IMDUR) 30 MG 24 hr tablet Take 1 tablet (30 mg total) by mouth daily.  . metFORMIN (GLUCOPHAGE-XR) 500 MG 24 hr tablet Take 2 tablets (1,000 mg total) by mouth daily with breakfast.  . methotrexate (RHEUMATREX) 2.5 MG tablet Take 15 mg by mouth once a week.  . metoprolol tartrate (LOPRESSOR) 25 MG tablet Take 25 mg by mouth 2 (two) times daily.  Marland Kitchen olmesartan (BENICAR) 40 MG tablet Take 1 tablet (40 mg total) by mouth daily.  . [EXPIRED] methylPREDNISolone acetate (DEPO-MEDROL) injection 20 mg    No facility-administered encounter medications on file as of 04/21/2019.

## 2019-04-21 ENCOUNTER — Encounter: Payer: Self-pay | Admitting: Family Medicine

## 2019-04-21 ENCOUNTER — Other Ambulatory Visit: Payer: Self-pay

## 2019-04-21 ENCOUNTER — Ambulatory Visit: Payer: BC Managed Care – PPO | Admitting: Family Medicine

## 2019-04-21 VITALS — BP 140/80 | HR 67 | Temp 98.3°F | Ht 69.0 in | Wt 274.2 lb

## 2019-04-21 DIAGNOSIS — G5602 Carpal tunnel syndrome, left upper limb: Secondary | ICD-10-CM

## 2019-04-21 MED ORDER — METHYLPREDNISOLONE ACETATE 40 MG/ML IJ SUSP
20.0000 mg | Freq: Once | INTRAMUSCULAR | Status: AC
  Administered 2019-04-21: 20 mg via INTRA_ARTICULAR

## 2019-04-27 ENCOUNTER — Encounter: Payer: Self-pay | Admitting: Family Medicine

## 2019-04-27 DIAGNOSIS — G5602 Carpal tunnel syndrome, left upper limb: Secondary | ICD-10-CM

## 2019-04-29 NOTE — Telephone Encounter (Signed)
Saw Dr Lorelei Pont for this - will route to him .

## 2019-05-09 ENCOUNTER — Encounter: Payer: Self-pay | Admitting: Family Medicine

## 2019-05-09 ENCOUNTER — Other Ambulatory Visit: Payer: Self-pay

## 2019-05-09 ENCOUNTER — Ambulatory Visit: Payer: BC Managed Care – PPO | Admitting: Family Medicine

## 2019-05-09 VITALS — BP 132/84 | HR 78 | Temp 96.4°F | Resp 18 | Ht 69.0 in | Wt 273.6 lb

## 2019-05-09 DIAGNOSIS — E1159 Type 2 diabetes mellitus with other circulatory complications: Secondary | ICD-10-CM

## 2019-05-09 DIAGNOSIS — E785 Hyperlipidemia, unspecified: Secondary | ICD-10-CM

## 2019-05-09 DIAGNOSIS — E1165 Type 2 diabetes mellitus with hyperglycemia: Secondary | ICD-10-CM | POA: Diagnosis not present

## 2019-05-09 DIAGNOSIS — I25119 Atherosclerotic heart disease of native coronary artery with unspecified angina pectoris: Secondary | ICD-10-CM

## 2019-05-09 DIAGNOSIS — I152 Hypertension secondary to endocrine disorders: Secondary | ICD-10-CM

## 2019-05-09 DIAGNOSIS — I2510 Atherosclerotic heart disease of native coronary artery without angina pectoris: Secondary | ICD-10-CM | POA: Insufficient documentation

## 2019-05-09 DIAGNOSIS — G5600 Carpal tunnel syndrome, unspecified upper limb: Secondary | ICD-10-CM | POA: Insufficient documentation

## 2019-05-09 DIAGNOSIS — M353 Polymyalgia rheumatica: Secondary | ICD-10-CM | POA: Diagnosis not present

## 2019-05-09 DIAGNOSIS — E1140 Type 2 diabetes mellitus with diabetic neuropathy, unspecified: Secondary | ICD-10-CM | POA: Diagnosis not present

## 2019-05-09 DIAGNOSIS — I10 Essential (primary) hypertension: Secondary | ICD-10-CM

## 2019-05-09 DIAGNOSIS — E1169 Type 2 diabetes mellitus with other specified complication: Secondary | ICD-10-CM

## 2019-05-09 DIAGNOSIS — IMO0002 Reserved for concepts with insufficient information to code with codable children: Secondary | ICD-10-CM

## 2019-05-09 LAB — POCT GLYCOSYLATED HEMOGLOBIN (HGB A1C): Hemoglobin A1C: 8.4 % — AB (ref 4.0–5.6)

## 2019-05-09 MED ORDER — EMPAGLIFLOZIN 10 MG PO TABS: 10.0000 mg | ORAL_TABLET | Freq: Every day | ORAL | 3 refills | Status: DC

## 2019-05-09 NOTE — Assessment & Plan Note (Signed)
Recent L wrist steroid injection by sports medicine - pending NCS - will check on scheduling.

## 2019-05-09 NOTE — Assessment & Plan Note (Addendum)
Appreciate cards care - now on imdur, continues aspirin and statin. Chest pain had fully resolved, now notes some discomfort over last few days - advised let us or cards know if persistent to consider increased imdur dose.

## 2019-05-09 NOTE — Assessment & Plan Note (Signed)
Chronic, stable with addition of imdur.

## 2019-05-09 NOTE — Assessment & Plan Note (Signed)
Managed with methotrexate followed by rheum.

## 2019-05-09 NOTE — Progress Notes (Signed)
This visit was conducted in person.  BP 132/84   Pulse 78   Temp (!) 96.4 F (35.8 C) (Temporal)   Resp 18   Ht 5\' 9"  (1.753 m)   Wt 273 lb 9.6 oz (124.1 kg)   SpO2 96%   BMI 40.40 kg/m    CC: DM f/u visit Subjective:    Patient ID: Joel Bennett, male    DOB: 26-Jul-1965, 54 y.o.   MRN: [id number]  HPI: Joel Bennett is a 54 y.o. male presenting on 05/09/2019 for Follow-up   I last saw patient 01/2019 with chest pain, he ended up being evaluated at the ER and saw cardiology - coronary CTA 02/16/2019 showed CaC score of 735 (98% for age). Echo was stable. Started on imdur 30mg  daily + aspirin 81mg  daily, lipitor was increased to 40mg  daily.   PMR followed by rheumatology on MTX, now off steroids.   Carpal tunnel syndrome saw Dr Lorelei Pont - s/p L wrist steroid injection. Recently referred to neuro for NCS for worsening symptoms  DM - does not regularly check sugars. Compliant with antihyperglycemic regimen which includes: amaryl 2mg  daily, metformin XR 1000mg  with breakfast. Ozempic caused nausea. Having loose stools x3-4 per day due to metformin. Denies low sugars or hypoglycemic symptoms. Denies paresthesias. Last diabetic eye exam 08/2018. Pneumovax: DUE. Prevnar: not due. Glucometer brand: freestyle lite meter - doesn't check sugars. DSME: interested in referral.  Lab Results  Component Value Date   HGBA1C 8.4 (A) 05/09/2019   Diabetic Foot Exam - Simple   Simple Foot Form Diabetic Foot exam was performed with the following findings: Yes 05/09/2019  8:39 AM  Visual Inspection See comments: Yes Sensation Testing Intact to touch and monofilament testing bilaterally: Yes Pulse Check Posterior Tibialis and Dorsalis pulse intact bilaterally: Yes Comments Maceration between 4th/5th toes on left    Lab Results  Component Value Date   MICROALBUR 0.8 02/20/2014         Relevant past medical, surgical, family and social history reviewed and updated as indicated. Interim  medical history since our last visit reviewed. Allergies and medications reviewed and updated. Outpatient Medications Prior to Visit  Medication Sig Dispense Refill  . aspirin EC 81 MG tablet Take 1 tablet (81 mg total) by mouth daily.    Marland Kitchen atorvastatin (LIPITOR) 40 MG tablet Take 1 tablet (40 mg total) by mouth daily. 90 tablet 3  . folic acid (FOLVITE) 1 MG tablet Take 1 mg by mouth daily.    Marland Kitchen glimepiride (AMARYL) 2 MG tablet Take 1 tablet (2 mg total) by mouth daily before breakfast. 90 tablet 3  . glucose blood (FREESTYLE LITE) test strip Use as instructed to check sugar 1-2 times daily as needed. Dx: E11.65 100 each 3  . indomethacin (INDOCIN) 50 MG capsule Take 50 mg by mouth 3 (three) times daily as needed.    . isosorbide mononitrate (IMDUR) 30 MG 24 hr tablet Take 1 tablet (30 mg total) by mouth daily. 90 tablet 3  . metFORMIN (GLUCOPHAGE-XR) 500 MG 24 hr tablet Take 2 tablets (1,000 mg total) by mouth daily with breakfast. 180 tablet 3  . methotrexate (RHEUMATREX) 2.5 MG tablet Take 15 mg by mouth once a week.    . olmesartan (BENICAR) 40 MG tablet Take 1 tablet (40 mg total) by mouth daily. 90 tablet 3  . metoprolol tartrate (LOPRESSOR) 25 MG tablet Take 25 mg by mouth 2 (two) times daily.     No facility-administered medications prior to  visit.     Per HPI unless specifically indicated in ROS section below Review of Systems Objective:    BP 132/84   Pulse 78   Temp (!) 96.4 F (35.8 C) (Temporal)   Resp 18   Ht 5\' 9"  (1.753 m)   Wt 273 lb 9.6 oz (124.1 kg)   SpO2 96%   BMI 40.40 kg/m   Wt Readings from Last 3 Encounters:  05/09/19 273 lb 9.6 oz (124.1 kg)  04/21/19 274 lb 4 oz (124.4 kg)  03/22/19 274 lb 8 oz (124.5 kg)    Physical Exam Vitals and nursing note reviewed.  Constitutional:      General: He is not in acute distress.    Appearance: He is well-developed.  HENT:     Head: Normocephalic and atraumatic.     Right Ear: External ear normal.     Left  Ear: External ear normal.     Nose: Nose normal.     Mouth/Throat:     Pharynx: No oropharyngeal exudate.  Eyes:     General: No scleral icterus.    Conjunctiva/sclera: Conjunctivae normal.     Pupils: Pupils are equal, round, and reactive to light.  Cardiovascular:     Rate and Rhythm: Normal rate and regular rhythm.     Heart sounds: Normal heart sounds. No murmur.  Pulmonary:     Effort: Pulmonary effort is normal. No respiratory distress.     Breath sounds: Normal breath sounds. No wheezing or rales.  Musculoskeletal:     Cervical back: Normal range of motion and neck supple.     Comments: See HPI for foot exam if done  Lymphadenopathy:     Cervical: No cervical adenopathy.  Skin:    General: Skin is warm and dry.     Findings: No rash.       Results for orders placed or performed in visit on 05/09/19  POCT glycosylated hemoglobin (Hb A1C)  Result Value Ref Range   Hemoglobin A1C 8.4 (A) 4.0 - 5.6 %   HbA1c POC (<> result, manual entry)     HbA1c, POC (prediabetic range)     HbA1c, POC (controlled diabetic range)     TTE 03/17/2019 1. Left ventricular ejection fraction, by visual estimation, is 60 to 65%. The left ventricle has normal function. There is mildly increased left ventricular hypertrophy. 2. The left ventricle has no regional wall motion abnormalities. 3. Global right ventricle has normal systolic function.The right ventricular size is normal. No increase in right ventricular wall thickness. 4. Left atrial size was mildly dilated 5. There is mild dilatation of the aortic root, 4.2 cm, ascending aorta is 3.6 cm. 6. Aortic valve not well visualized. 7. Normal pulmonary artery systolic pressure  Coronary CTA date 02/16/2019 IMPRESSION: 1. Coronary calcium score of 735. This was 41 percentile for age and sex matched control. 2. Normal coronary origin with left dominance. 3. Mild CAD with 25-49% calcified stenosis in the proximal and mid LAD  (CADRADS-2). Assessment & Plan:  This visit occurred during the SARS-CoV-2 public health emergency.  Safety protocols were in place, including screening questions prior to the visit, additional usage of staff PPE, and extensive cleaning of exam room while observing appropriate contact time as indicated for disinfecting solutions.   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  Having trouble tolerating metformin XR 1000mg  once daily due to  loose stools.  Did not tolerate ozempic due to nausea, even at starting dose.  Price out SGLT2 medication - sent in jardiance to pharmacy. Update if unaffordable, consider increased amaryl vs starting another medication.  Will also refer to diabetes education.       Relevant Medications   empagliflozin (JARDIANCE) 10 MG TABS tablet   Other Relevant Orders   POCT glycosylated hemoglobin (Hb A1C) (Completed)   Ambulatory referral to diabetic education   Polymyalgia rheumatica ([place])    Managed with methotrexate followed by rheum.       Hypertension associated with diabetes ([place])    Chronic, stable with addition of imdur.       Relevant Medications   empagliflozin (JARDIANCE) 10 MG TABS tablet   Dyslipidemia associated with type 2 diabetes mellitus [facility])    Cardiology recently increased atorvastatin to 40mg  daily - continue.      Relevant Medications   empagliflozin (JARDIANCE) 10 MG TABS tablet   Other Relevant Orders   Ambulatory referral to diabetic education   Carpal tunnel syndrome    Recent L wrist steroid injection by sports medicine - pending NCS - will check on scheduling.       CAD (coronary artery disease), native coronary artery    Appreciate cards care - now on imdur, continues aspirin and statin. Chest pain had fully resolved, now notes some discomfort over last few days - advised let us or cards know if persistent to consider increased imdur dose.        Relevant Orders   Ambulatory referral to diabetic education       Meds ordered this encounter  Medications  . empagliflozin (JARDIANCE) 10 MG TABS tablet    Sig: Take 10 mg by mouth daily before breakfast.    Dispense:  30 tablet    Refill:  3    To price out   Orders Placed This Encounter  Procedures  . Ambulatory referral to diabetic education    Referral Priority:   Routine    Referral Type:   Consultation    Referral Reason:   Specialty Services Required    Number of Visits Requested:   1  . POCT glycosylated hemoglobin (Hb A1C)   Patient instructions: Touch base with our referral coordinator to check on nerve conduction scheduling.  For sugars - price out jardiance sent to your pharmacy.  We will refer you to diabetes nurse and nutritionist.  If low sugars, follow the 15-15 rule: The 15-15 rule: Have 15 grams of carbohydrate to raise your blood sugar and check it after 15 minutes. If it's still below 70 mg/dL, have another serving. Repeat these steps until your blood sugar is at least 70 mg/dL. Once your blood sugar is back to normal, eat a meal or snack to make sure it doesn't lower again.  This may be: -Glucose tablets (see instructions) -Gel tube (see instructions) -4 ounces (1/2 cup) of juice or regular soda (not diet) -1 tablespoon of sugar, honey, or corn syrup -Hard candies, jellybeans or gumdrops--see food label for how many to consume   Return in 3 months for diabetes check. Let me know sooner if any concerns or trouble with the medicines.  Follow up plan: Return in about 3 months (around 08/06/2019) for follow up visit.  Ria Bush, MD

## 2019-05-09 NOTE — Patient Instructions (Addendum)
Touch base with our referral coordinator to check on nerve conduction scheduling.  For sugars - price out jardiance sent to your pharmacy.  We will refer you to diabetes nurse and nutritionist.  If low sugars, follow the 15-15 rule: The 15-15 rule: Have 15 grams of carbohydrate to raise your blood sugar and check it after 15 minutes. If it's still below 70 mg/dL, have another serving. Repeat these steps until your blood sugar is at least 70 mg/dL. Once your blood sugar is back to normal, eat a meal or snack to make sure it doesn't lower again.  This may be: -Glucose tablets (see instructions) -Gel tube (see instructions) -4 ounces (1/2 cup) of juice or regular soda (not diet) -1 tablespoon of sugar, honey, or corn syrup -Hard candies, jellybeans or gumdrops--see food label for how many to consume   Return in 3 months for diabetes check. Let me know sooner if any concerns or trouble with the medicines.   Diabetes Mellitus and Nutrition, Adult When you have diabetes (diabetes mellitus), it is very important to have healthy eating habits because your blood sugar (glucose) levels are greatly affected by what you eat and drink. Eating healthy foods in the appropriate amounts, at about the same times every day, can help you:  Control your blood glucose.  Lower your risk of heart disease.  Improve your blood pressure.  Reach or maintain a healthy weight. Every person with diabetes is different, and each person has different needs for a meal plan. Your health care provider may recommend that you work with a diet and nutrition specialist (dietitian) to make a meal plan that is best for you. Your meal plan may vary depending on factors such as:  The calories you need.  The medicines you take.  Your weight.  Your blood glucose, blood pressure, and cholesterol levels.  Your activity level.  Other health conditions you have, such as heart or kidney disease. How do carbohydrates affect  me? Carbohydrates, also called carbs, affect your blood glucose level more than any other type of food. Eating carbs naturally raises the amount of glucose in your blood. Carb counting is a method for keeping track of how many carbs you eat. Counting carbs is important to keep your blood glucose at a healthy level, especially if you use insulin or take certain oral diabetes medicines. It is important to know how many carbs you can safely have in each meal. This is different for every person. Your dietitian can help you calculate how many carbs you should have at each meal and for each snack. Foods that contain carbs include:  Bread, cereal, rice, pasta, and crackers.  Potatoes and corn.  Peas, beans, and lentils.  Milk and yogurt.  Fruit and juice.  Desserts, such as cakes, cookies, ice cream, and candy. How does alcohol affect me? Alcohol can cause a sudden decrease in blood glucose (hypoglycemia), especially if you use insulin or take certain oral diabetes medicines. Hypoglycemia can be a life-threatening condition. Symptoms of hypoglycemia (sleepiness, dizziness, and confusion) are similar to symptoms of having too much alcohol. If your health care provider says that alcohol is safe for you, follow these guidelines:  Limit alcohol intake to no more than 1 drink per day for nonpregnant women and 2 drinks per day for men. One drink equals 12 oz of beer, 5 oz of wine, or 1 oz of hard liquor.  Do not drink on an empty stomach.  Keep yourself hydrated with water, diet  soda, or unsweetened iced tea.  Keep in mind that regular soda, juice, and other mixers may contain a lot of sugar and must be counted as carbs. What are tips for following this plan?  Reading food labels  Start by checking the serving size on the "Nutrition Facts" label of packaged foods and drinks. The amount of calories, carbs, fats, and other nutrients listed on the label is based on one serving of the item. Many items  contain more than one serving per package.  Check the total grams (g) of carbs in one serving. You can calculate the number of servings of carbs in one serving by dividing the total carbs by 15. For example, if a food has 30 g of total carbs, it would be equal to 2 servings of carbs.  Check the number of grams (g) of saturated and trans fats in one serving. Choose foods that have low or no amount of these fats.  Check the number of milligrams (mg) of salt (sodium) in one serving. Most people should limit total sodium intake to less than 2,300 mg per day.  Always check the nutrition information of foods labeled as "low-fat" or "nonfat". These foods may be higher in added sugar or refined carbs and should be avoided.  Talk to your dietitian to identify your daily goals for nutrients listed on the label. Shopping  Avoid buying canned, premade, or processed foods. These foods tend to be high in fat, sodium, and added sugar.  Shop around the outside edge of the grocery store. This includes fresh fruits and vegetables, bulk grains, fresh meats, and fresh dairy. Cooking  Use low-heat cooking methods, such as baking, instead of high-heat cooking methods like deep frying.  Cook using healthy oils, such as olive, canola, or sunflower oil.  Avoid cooking with butter, cream, or high-fat meats. Meal planning  Eat meals and snacks regularly, preferably at the same times every day. Avoid going long periods of time without eating.  Eat foods high in fiber, such as fresh fruits, vegetables, beans, and whole grains. Talk to your dietitian about how many servings of carbs you can eat at each meal.  Eat 4-6 ounces (oz) of lean protein each day, such as lean meat, chicken, fish, eggs, or tofu. One oz of lean protein is equal to: ? 1 oz of meat, chicken, or fish. ? 1 egg. ?  cup of tofu.  Eat some foods each day that contain healthy fats, such as avocado, nuts, seeds, and fish. Lifestyle  Check your  blood glucose regularly.  Exercise regularly as told by your health care provider. This may include: ? 150 minutes of moderate-intensity or vigorous-intensity exercise each week. This could be brisk walking, biking, or water aerobics. ? Stretching and doing strength exercises, such as yoga or weightlifting, at least 2 times a week.  Take medicines as told by your health care provider.  Do not use any products that contain nicotine or tobacco, such as cigarettes and e-cigarettes. If you need help quitting, ask your health care provider.  Work with a Social worker or diabetes educator to identify strategies to manage stress and any emotional and social challenges. Questions to ask a health care provider  Do I need to meet with a diabetes educator?  Do I need to meet with a dietitian?  What number can I call if I have questions?  When are the best times to check my blood glucose? Where to find more information:  American Diabetes Association: diabetes.org  Academy of Nutrition and Dietetics: www.eatright.[employer] of Diabetes and Digestive and Kidney Diseases (NIH): [employer] Summary  A healthy meal plan will help you control your blood glucose and maintain a healthy lifestyle.  Working with a diet and nutrition specialist (dietitian) can help you make a meal plan that is best for you.  Keep in mind that carbohydrates (carbs) and alcohol have immediate effects on your blood glucose levels. It is important to count carbs and to use alcohol carefully. This information is not intended to replace advice given to you by your health care provider. Make sure you discuss any questions you have with your health care provider. Document Revised: 02/20/2017 Document Reviewed: 04/14/2016 Elsevier Patient Education  2020 [employer].

## 2019-05-09 NOTE — Assessment & Plan Note (Addendum)
Chronic, not at goal.  Having trouble tolerating metformin XR 1000mg  once daily due to loose stools.  Did not tolerate ozempic due to nausea, even at starting dose.  Price out SGLT2 medication - sent in jardiance to pharmacy. Update if unaffordable, consider increased amaryl vs starting another medication.  Will also refer to diabetes education.

## 2019-05-09 NOTE — Assessment & Plan Note (Signed)
Cardiology recently increased atorvastatin to 40mg  daily - continue.

## 2019-05-11 DIAGNOSIS — R200 Anesthesia of skin: Secondary | ICD-10-CM | POA: Diagnosis not present

## 2019-05-11 DIAGNOSIS — R202 Paresthesia of skin: Secondary | ICD-10-CM | POA: Diagnosis not present

## 2019-05-18 ENCOUNTER — Encounter: Payer: Self-pay | Admitting: Family Medicine

## 2019-05-18 DIAGNOSIS — E790 Hyperuricemia without signs of inflammatory arthritis and tophaceous disease: Secondary | ICD-10-CM | POA: Diagnosis not present

## 2019-05-18 DIAGNOSIS — Z6841 Body Mass Index (BMI) 40.0 and over, adult: Secondary | ICD-10-CM | POA: Diagnosis not present

## 2019-05-18 DIAGNOSIS — G5602 Carpal tunnel syndrome, left upper limb: Secondary | ICD-10-CM

## 2019-05-18 DIAGNOSIS — M0609 Rheumatoid arthritis without rheumatoid factor, multiple sites: Secondary | ICD-10-CM | POA: Diagnosis not present

## 2019-05-18 DIAGNOSIS — M150 Primary generalized (osteo)arthritis: Secondary | ICD-10-CM | POA: Diagnosis not present

## 2019-05-19 ENCOUNTER — Encounter: Payer: Self-pay | Admitting: Family Medicine

## 2019-05-23 ENCOUNTER — Other Ambulatory Visit: Payer: Self-pay

## 2019-05-23 ENCOUNTER — Ambulatory Visit (INDEPENDENT_AMBULATORY_CARE_PROVIDER_SITE_OTHER): Payer: BC Managed Care – PPO | Admitting: Cardiology

## 2019-05-23 ENCOUNTER — Encounter: Payer: Self-pay | Admitting: Cardiology

## 2019-05-23 VITALS — BP 108/60 | HR 88 | Ht 69.0 in | Wt 268.5 lb

## 2019-05-23 DIAGNOSIS — I10 Essential (primary) hypertension: Secondary | ICD-10-CM

## 2019-05-23 DIAGNOSIS — I2510 Atherosclerotic heart disease of native coronary artery without angina pectoris: Secondary | ICD-10-CM | POA: Diagnosis not present

## 2019-05-23 DIAGNOSIS — E7800 Pure hypercholesterolemia, unspecified: Secondary | ICD-10-CM

## 2019-05-23 HISTORY — PX: CARPAL TUNNEL RELEASE: SHX101

## 2019-05-23 NOTE — Patient Instructions (Signed)
Medication Instructions:  - Your physician recommends that you continue on your current medications as directed. Please refer to the Current Medication list given to you today.  *If you need a refill on your cardiac medications before your next appointment, please call your pharmacy*   Lab Work: - none ordered  If you have labs (blood work) drawn today and your tests are completely normal, you will receive your results only by: . MyChart Message (if you have MyChart) OR . A paper copy in the mail If you have any lab test that is abnormal or we need to change your treatment, we will call you to review the results.   Testing/Procedures: - none ordered   Follow-Up: At [facility], you and your health needs are our priority.  As part of our continuing mission to provide you with exceptional heart care, we have created designated Provider Care Teams.  These Care Teams include your primary Cardiologist (physician) and Advanced Practice Providers (APPs -  Physician Assistants and Nurse Practitioners) who all work together to provide you with the care you need, when you need it.  We recommend signing up for the patient portal called "MyChart".  Sign up information is provided on this After Visit Summary.  MyChart is used to connect with patients for Virtual Visits (Telemedicine).  Patients are able to view lab/test results, encounter notes, upcoming appointments, etc.  Non-urgent messages can be sent to your provider as well.   To learn more about what you can do with MyChart, go to [email or web address].    Your next appointment:   6 month(s)  The format for your next appointment:   In Person  Provider:   Brian Agbor-Etang, MD   Other Instructions n/a  

## 2019-05-23 NOTE — Progress Notes (Signed)
Cardiology Office Note:    Date:  05/23/2019   ID:  Joel Bennett, DOB [birth year], MRN [id number]  PCP:  Ria Bush, MD  Cardiologist:  Kate Sable, MD  Electrophysiologist:  None   Referring MD: Ria Bush, MD   Chief Complaint  Patient presents with  . OTHER    2 month f/u no complaints today. Meds reviewed verbally with pt.    History of Present Illness:    Joel Bennett is a 54 y.o. male with a hx of diabetes, hypertension, hyperlipidemia who presents for follow-up.  He was originally seen due to atypical chest pain symptoms for 2 weeks.  Symptoms resolved/improved with nitroglycerin.  Due to risk factors and family history echo and coronary CTA was ordered.  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  Echocardiogram 02/2019 showed normal systolic and diastolic function, mild LVH, mild aortic root dilatation measuring 42 mm.  Imdur 30 mg daily was started for possible microvascular dysfunction.  Patient states symptoms have improved with Imdur.  Denies headaches or any side effects from the nitrate.  Past Medical History:  Diagnosis Date  . Allergic rhinitis   . Allergy   . DDD (degenerative disc disease), lumbar   . Diabetes mellitus without complication ([place])   . Diabetes type 2, uncontrolled ([place]) 2012  . Gout   . History of depression 2000   treated with meds  . Hypertension 1994  . Seronegative rheumatoid arthritis ([place]) 01/2019   MTX started 01/2019 - sees [place] rheum Marella Chimes)  . Stool incontinence 12/2011   neg giardia, neg O&P, neg iFOB from prior PCP    Past Surgical History:  Procedure Laterality Date  . ANKLE SURGERY Left 1999  . BACK SURGERY  1997   Lumbar  . CARDIOVASCULAR STRESS TEST  08/2015   WNL (Dr Oval Linsey)  . COLONOSCOPY  05/2014   TA several, rpt 3 yrs Ardis Hughs)  . COLONOSCOPY  11/2018   TA, rpt 7 yrs Ardis Hughs)  . KNEE SURGERY Right 1985  . SHOULDER SURGERY Left 2002   x 2   . Thumb Surgery Left    Joint Fusion on left hand    Current Medications: Current Meds  Medication Sig  . aspirin EC 81 MG tablet Take 1 tablet (81 mg total) by mouth daily.  Marland Kitchen atorvastatin (LIPITOR) 40 MG tablet Take 1 tablet (40 mg total) by mouth daily.  . empagliflozin (JARDIANCE) 10 MG TABS tablet Take 10 mg by mouth daily before breakfast.  . folic acid (FOLVITE) 1 MG tablet Take 1 mg by mouth daily.  Marland Kitchen glimepiride (AMARYL) 2 MG tablet Take 1 tablet (2 mg total) by mouth daily before breakfast.  . glucose blood (FREESTYLE LITE) test strip Use as instructed to check sugar 1-2 times daily as needed. Dx: E11.65  . indomethacin (INDOCIN) 50 MG capsule Take 50 mg by mouth 3 (three) times daily as needed.  . isosorbide mononitrate (IMDUR) 30 MG 24 hr tablet Take 1 tablet (30 mg total) by mouth daily.  . metFORMIN (GLUCOPHAGE-XR) 500 MG 24 hr tablet Take 2 tablets (1,000 mg total) by mouth daily with breakfast.  . methotrexate (RHEUMATREX) 2.5 MG tablet Take 15 mg by mouth once a week.  . olmesartan (BENICAR) 40 MG tablet Take 1 tablet (40 mg total) by mouth daily.     Allergies:   Ozempic (0.25 or 0.5 mg-dose) [semaglutide(0.25 or 0.5mg -dos)]   Social History   Socioeconomic History  .  Marital status: Married    Spouse name: Not on file  . Number of children: Not on file  . Years of education: Not on file  . Highest education level: Not on file  Occupational History  . Not on file  Tobacco Use  . Smoking status: Never Smoker  . Smokeless tobacco: Current User    Types: Snuff  Substance and Sexual Activity  . Alcohol use: Yes    Alcohol/week: 0.0 standard drinks    Comment: occasional  . Drug use: No  . Sexual activity: Yes  Other Topics Concern  . Not on file  Social History Narrative   Lives with wife   Grown children   Occ: Database administrator   Edu: HS   Activity: no regular exercise   Diet: good water, fruits/vegetables daily, red meat regularly   Social  Determinants of Health   Financial Resource Strain:   . Difficulty of Paying Living Expenses: Not on file  Food Insecurity:   . Worried About Charity fundraiser in the Last Year: Not on file  . Ran Out of Food in the Last Year: Not on file  Transportation Needs:   . Lack of Transportation (Medical): Not on file  . Lack of Transportation (Non-Medical): Not on file  Physical Activity:   . Days of Exercise per Week: Not on file  . Minutes of Exercise per Session: Not on file  Stress:   . Feeling of Stress : Not on file  Social Connections:   . Frequency of Communication with Friends and Family: Not on file  . Frequency of Social Gatherings with Friends and Family: Not on file  . Attends Religious Services: Not on file  . Active Member of Clubs or Organizations: Not on file  . Attends Archivist Meetings: Not on file  . Marital Status: Not on file     Family History: The patient's family history includes Alcohol abuse in his maternal grandfather, paternal grandfather, and paternal uncle; Atrial fibrillation in his father; CAD in his paternal grandmother and paternal uncle; CAD (age of onset: 99) in his paternal aunt; Cancer in his maternal aunt, maternal aunt, mother, paternal grandmother, and another family member; Diabetes in his cousin and father; Rectal cancer in his maternal aunt; Stroke in his cousin and maternal grandmother. There is no history of Colon polyps, Esophageal cancer, or Stomach cancer.  ROS:   Please see the history of present illness.     All other systems reviewed and are negative.  EKGs/Labs/Other Studies Reviewed:    The following studies were reviewed today:  TTE 03/17/2019  1. Left ventricular ejection fraction, by visual estimation, is 60 to 65%. The left ventricle has normal function. There is mildly increased left ventricular hypertrophy.  2. The left ventricle has no regional wall motion abnormalities.  3. Global right ventricle has normal  systolic function.The right ventricular size is normal. No increase in right ventricular wall thickness.  4. Left atrial size was mildly dilated  5. There is mild dilatation of the aortic root, 4.2 cm, ascending aorta is 3.6 cm.  6. Aortic valve not well visualized.  7. Normal pulmonary artery systolic pressure  Coronary CTA date 02/16/2019 IMPRESSION: 1. Coronary calcium score of 735. This was 72 percentile for age and sex matched control.  2. Normal coronary origin with left dominance.  3. Mild CAD with 25-49% calcified stenosis in the proximal and mid LAD (CADRADS-2).  4. See separate report from radiology for noncardiac structures  Lexiscan myocardial perfusion stress test 08/23/2015  The left ventricular ejection fraction is normal (55-65%).  Nuclear stress EF: 57%.  There was no ST segment deviation noted during stress.  The study is normal.  This is a low risk study.  EKG:  EKG is  ordered today.  The ekg ordered today demonstrates normal sinus rhythm, left axis deviation Recent Labs: 08/10/2018: TSH 1.51 11/16/2018: ALT 26 02/09/2019: BUN 12 03/29/2019: Creatinine 0.9; Hemoglobin 14.6; Platelets 200; Potassium 4.0; Sodium 138  Recent Lipid Panel    Component Value Date/Time   CHOL 167 10/28/2018 0917   TRIG 184.0 (H) 10/28/2018 0917   HDL 36.40 (L) 10/28/2018 0917   CHOLHDL 5 10/28/2018 0917   VLDL 36.8 10/28/2018 0917   LDLCALC 94 10/28/2018 0917   LDLDIRECT 97.0 08/10/2018 0843    Physical Exam:    VS:  BP 108/60 (BP Location: Left Arm, Patient Position: Sitting, Cuff Size: Large)   Pulse 88   Ht 5\' 9"  (1.753 m)   Wt 268 lb 8 oz (121.8 kg)   SpO2 98%   BMI 39.65 kg/m     Wt Readings from Last 3 Encounters:  05/23/19 268 lb 8 oz (121.8 kg)  05/09/19 273 lb 9.6 oz (124.1 kg)  04/21/19 274 lb 4 oz (124.4 kg)     GEN:  Well nourished, well developed in no acute distress, obese HEENT: Normal NECK: No JVD; No carotid bruits LYMPHATICS: No  lymphadenopathy CARDIAC: RRR, no murmurs, rubs, gallops RESPIRATORY:  Clear to auscultation without rales, wheezing or rhonchi  ABDOMEN: Soft, non-tender, non-distended MUSCULOSKELETAL:  No edema; No deformity  SKIN: Warm and dry NEUROLOGIC:  Alert and oriented x 3 PSYCHIATRIC:  Normal affect   ASSESSMENT:   Patient with symptoms of atypical chest pain but concerning due to his risk factors of hypertension, hyperlipidemia, diabetes, obesity, and also improvement of pain with nitroglycerin.  Echocardiogram shows normal systolic and diastolic function.  Mild LVH, mild aortic root dilatation measuring 4.2 cm.  Coronary CTA showed mild calcification in the proximal to mid LAD, coronary calcium score of 735.  Has occasional chest discomfort.  It is possible he may have microvascular disease especially as symptoms improve with nitroglycerin.  1. Coronary artery disease involving native heart, angina presence unspecified, unspecified vessel or lesion type   2. Essential hypertension   3. Pure hypercholesterolemia    PLAN:    In order of problems listed above:  1. Patient with history of atypical chest pain,  coronary CTA shows mild nonobstructive CAD in the mid to proximal LAD.  Coronary calcium 735.  Patient with occasional chest discomfort possibly has some microvascular disease.  Symptoms have improved since starting Imdur.  Continue Imdur 30 mg daily, continue aspirin 81 mg, Lipitor 40 mg daily. 2. Pressure adequately controlled.  Continue Benicar 40 mg daily, Imdur 30 daily 3. History of hyperlipidemia, elevated coronary calcium score.  Continue Lipitor to 40 mg daily.  Follow up in 6 months..  Total encounter time more than 35 minutes  Greater than 50% was spent in counseling and coordination of care with the patient  This note was generated in part or whole with voice recognition software. Voice recognition is usually quite accurate but there are transcription errors that can and  very often do occur. I apologize for any typographical errors that were not detected and corrected.  Medication Adjustments/Labs and Tests Ordered: Current medicines are reviewed at length with the patient today.  Concerns regarding medicines are outlined above.  Orders Placed This Encounter  Procedures  . EKG 12-Lead   No orders of the defined types were placed in this encounter.   Patient Instructions  Medication Instructions:  - Your physician recommends that you continue on your current medications as directed. Please refer to the Current Medication list given to you today.  *If you need a refill on your cardiac medications before your next appointment, please call your pharmacy*   Lab Work: - none ordered  If you have labs (blood work) drawn today and your tests are completely normal, you will receive your results only by: Marland Kitchen MyChart Message (if you have MyChart) OR . A paper copy in the mail If you have any lab test that is abnormal or we need to change your treatment, we will call you to review the results.   Testing/Procedures: - none ordered   Follow-Up: At [facility], you and your health needs are our priority.  As part of our continuing mission to provide you with exceptional heart care, we have created designated Provider Care Teams.  These Care Teams include your primary Cardiologist (physician) and Advanced Practice Providers (APPs -  Physician Assistants and Nurse Practitioners) who all work together to provide you with the care you need, when you need it.  We recommend signing up for the patient portal called "MyChart".  Sign up information is provided on this After Visit Summary.  MyChart is used to connect with patients for Virtual Visits (Telemedicine).  Patients are able to view lab/test results, encounter notes, upcoming appointments, etc.  Non-urgent messages can be sent to your provider as well.   To learn more about what you can do with MyChart, go to  [email or web address].    Your next appointment:   6 month(s)  The format for your next appointment:   In Person  Provider:   Kate Sable, MD   Other Instructions n/a     Signed, Kate Sable, MD  05/23/2019 8:26 AM    [place]

## 2019-05-30 ENCOUNTER — Other Ambulatory Visit: Payer: Self-pay | Admitting: Family Medicine

## 2019-05-30 NOTE — Telephone Encounter (Signed)
Spoke to patient by telephone and was advised that he did not request the Prednisone and has not taken that for a while. Patient stated that he is on automatic refills and this must have been sent in error. Will deny refill request.

## 2019-05-31 DIAGNOSIS — G5602 Carpal tunnel syndrome, left upper limb: Secondary | ICD-10-CM | POA: Diagnosis not present

## 2019-06-01 DIAGNOSIS — Z01812 Encounter for preprocedural laboratory examination: Secondary | ICD-10-CM | POA: Diagnosis not present

## 2019-06-01 DIAGNOSIS — Z20822 Contact with and (suspected) exposure to covid-19: Secondary | ICD-10-CM | POA: Diagnosis not present

## 2019-06-03 DIAGNOSIS — M1990 Unspecified osteoarthritis, unspecified site: Secondary | ICD-10-CM | POA: Diagnosis not present

## 2019-06-03 DIAGNOSIS — I10 Essential (primary) hypertension: Secondary | ICD-10-CM | POA: Diagnosis not present

## 2019-06-03 DIAGNOSIS — Z6839 Body mass index (BMI) 39.0-39.9, adult: Secondary | ICD-10-CM | POA: Diagnosis not present

## 2019-06-03 DIAGNOSIS — E119 Type 2 diabetes mellitus without complications: Secondary | ICD-10-CM | POA: Diagnosis not present

## 2019-06-03 DIAGNOSIS — E669 Obesity, unspecified: Secondary | ICD-10-CM | POA: Diagnosis not present

## 2019-06-03 DIAGNOSIS — I2510 Atherosclerotic heart disease of native coronary artery without angina pectoris: Secondary | ICD-10-CM | POA: Diagnosis not present

## 2019-06-03 DIAGNOSIS — Z7984 Long term (current) use of oral hypoglycemic drugs: Secondary | ICD-10-CM | POA: Diagnosis not present

## 2019-06-03 DIAGNOSIS — G5602 Carpal tunnel syndrome, left upper limb: Secondary | ICD-10-CM | POA: Diagnosis not present

## 2019-06-03 DIAGNOSIS — Z79899 Other long term (current) drug therapy: Secondary | ICD-10-CM | POA: Diagnosis not present

## 2019-06-03 DIAGNOSIS — Z791 Long term (current) use of non-steroidal anti-inflammatories (NSAID): Secondary | ICD-10-CM | POA: Diagnosis not present

## 2019-06-09 ENCOUNTER — Other Ambulatory Visit: Payer: Self-pay

## 2019-06-09 ENCOUNTER — Encounter: Payer: Self-pay | Admitting: Dietician

## 2019-06-09 ENCOUNTER — Encounter: Payer: BC Managed Care – PPO | Attending: Family Medicine | Admitting: Dietician

## 2019-06-09 VITALS — Ht 69.0 in | Wt 257.0 lb

## 2019-06-09 DIAGNOSIS — E119 Type 2 diabetes mellitus without complications: Secondary | ICD-10-CM

## 2019-06-09 NOTE — Progress Notes (Signed)
Medical Nutrition Therapy: Visit start time: 1700  end time: 1800  Assessment:  Diagnosis: Type 2 DM Past medical history: HTN, gout Psychosocial issues/ stress concerns: none  Preferred learning method:  . Hands-on   Current weight: 237.0lbs Height: 5'9" Medications, supplements: reconciled list in medical record  Progress and evaluation:   Patient has had diabetes for about 11-12 years. BG has not been well controlled for past 2 years. Has recently started Jardiance and Glimepiride.  Patient reports some improvement in BGs since starting new meds and making diet changes.  Diet changes include smaller portions, fewer fried foods, reduced sugar intake.  Physical activity: none; some on the job activity  Dietary Intake:  Usual eating pattern includes 3 meals and 2-3 snacks per day. Dining out frequency: 1 meals per week.  Breakfast: eggs, bacon, toast, hash browns, waffles/ pancakes; cereal; pop tart; egg sandwich Snack: 10am -- pecans or fruit Lunch: 12-- usually leftovers Snack: 3pm apple  Supper: 6-- variety-- beef/ chicken/ pork chop or tenderloin/ spaghetti + potato/ rice + corn, green beans, brocc, squash, etc Snack: 7:30 nuts or fruit Beverages: water 64oz, pepsi max zero sugar  Nutrition Care Education: Topics covered:  Basic nutrition: basic food groups, appropriate nutrient balance, appropriate meal and snack schedule, general nutrition guidelines    Weight control: importance of low sugar and low fat choices, portion control, estimated energy needs for weight loss at 1800 kcal, provided guidance for 45% CHO, 25% pro, and 30% fat. Diabetes: appropriate meal and snack schedule, appropriate carb intake and balance, healthy carb choices, role of fiber, protein, fat; role of physical activity; appropriate BG testing times  Hypertension:  importance of controlling sodium intake   Nutritional Diagnosis:  [place]-2.2 Altered nutrition-related laboratory As related to type 2  diabetes.  As evidenced by recent HbA1C of 8.6%. [place]-3.3 Overweight/obesity As related to excess calories and inadequate physical activity.  As evidenced by patient wtih current BMI of 37.95, making dietary changes to improve blood sugar control and weight.  Intervention:   Instruction and discussion as noted above.  Patient and spouse have been making positive and appropriate dietary changes, and are motivated to continue.  Goal is to continue improving food portions and nutritional balance in meals.   No follow-up MNT scheduled at this time; patient will schedule later if needed.  Education Materials given:  . General diet guidelines for Diabetes . Plate Planner with food lists, sample meal pattern . sample menus . Snacking handout . Goals/ instructions   Learner/ who was taught:  . Patient  . Spouse/ partner  Level of understanding: Marland Kitchen Verbalizes/ demonstrates competency   Demonstrated degree of understanding via:   Teach back Learning barriers: . None   Willingness to learn/ readiness for change: . Eager, change in progress   Monitoring and Evaluation:  Dietary intake, exercise, BG control, and body weight      follow up: prn

## 2019-06-09 NOTE — Patient Instructions (Signed)
   Continue to control portions of carb foods and include a protein food with each meal.   Have generous portions of the low-carb veggies.   Stay active at work and/or at home each day.

## 2019-06-13 ENCOUNTER — Encounter: Payer: Self-pay | Admitting: Family Medicine

## 2019-08-23 DIAGNOSIS — G5602 Carpal tunnel syndrome, left upper limb: Secondary | ICD-10-CM | POA: Diagnosis not present

## 2019-08-23 DIAGNOSIS — M0609 Rheumatoid arthritis without rheumatoid factor, multiple sites: Secondary | ICD-10-CM | POA: Diagnosis not present

## 2019-08-23 DIAGNOSIS — M150 Primary generalized (osteo)arthritis: Secondary | ICD-10-CM | POA: Diagnosis not present

## 2019-08-23 DIAGNOSIS — Z8739 Personal history of other diseases of the musculoskeletal system and connective tissue: Secondary | ICD-10-CM | POA: Diagnosis not present

## 2019-09-02 ENCOUNTER — Other Ambulatory Visit: Payer: Self-pay | Admitting: Family Medicine

## 2019-10-20 ENCOUNTER — Other Ambulatory Visit: Payer: Self-pay | Admitting: Family Medicine

## 2019-10-20 NOTE — Telephone Encounter (Signed)
Please schedule appointment with Dr. Darnell Level to follow up on his diabetes.  He was suppose to follow up back in May.  Please sent back to [facility] once scheduled to refill medication.

## 2019-10-24 NOTE — Telephone Encounter (Signed)
Called patient and got him scheduled for DM F/U appointment.

## 2019-10-26 ENCOUNTER — Other Ambulatory Visit: Payer: Self-pay | Admitting: Family Medicine

## 2019-10-27 MED ORDER — EMPAGLIFLOZIN 10 MG PO TABS: 10.0000 mg | ORAL_TABLET | Freq: Every day | ORAL | 0 refills | Status: DC

## 2019-10-28 ENCOUNTER — Encounter: Payer: Self-pay | Admitting: Family Medicine

## 2019-10-28 ENCOUNTER — Ambulatory Visit (INDEPENDENT_AMBULATORY_CARE_PROVIDER_SITE_OTHER): Payer: Self-pay | Admitting: Family Medicine

## 2019-10-28 ENCOUNTER — Other Ambulatory Visit: Payer: Self-pay

## 2019-10-28 VITALS — BP 136/84 | HR 66 | Temp 97.6°F | Ht 69.0 in | Wt 262.0 lb

## 2019-10-28 DIAGNOSIS — I10 Essential (primary) hypertension: Secondary | ICD-10-CM

## 2019-10-28 DIAGNOSIS — I152 Hypertension secondary to endocrine disorders: Secondary | ICD-10-CM

## 2019-10-28 DIAGNOSIS — E1159 Type 2 diabetes mellitus with other circulatory complications: Secondary | ICD-10-CM

## 2019-10-28 DIAGNOSIS — M1A9XX0 Chronic gout, unspecified, without tophus (tophi): Secondary | ICD-10-CM

## 2019-10-28 DIAGNOSIS — E1140 Type 2 diabetes mellitus with diabetic neuropathy, unspecified: Secondary | ICD-10-CM

## 2019-10-28 DIAGNOSIS — IMO0002 Reserved for concepts with insufficient information to code with codable children: Secondary | ICD-10-CM

## 2019-10-28 DIAGNOSIS — I25119 Atherosclerotic heart disease of native coronary artery with unspecified angina pectoris: Secondary | ICD-10-CM

## 2019-10-28 DIAGNOSIS — M0600 Rheumatoid arthritis without rheumatoid factor, unspecified site: Secondary | ICD-10-CM

## 2019-10-28 DIAGNOSIS — E1165 Type 2 diabetes mellitus with hyperglycemia: Secondary | ICD-10-CM

## 2019-10-28 LAB — POCT GLYCOSYLATED HEMOGLOBIN (HGB A1C): Hemoglobin A1C: 8.5 % — AB (ref 4.0–5.6)

## 2019-10-28 MED ORDER — OLMESARTAN MEDOXOMIL 40 MG PO TABS: 40.0000 mg | ORAL_TABLET | Freq: Every day | ORAL | 3 refills | Status: DC

## 2019-10-28 MED ORDER — GLIMEPIRIDE 2 MG PO TABS: 2.0000 mg | ORAL_TABLET | Freq: Every day | ORAL | 3 refills | Status: DC

## 2019-10-28 MED ORDER — EMPAGLIFLOZIN 25 MG PO TABS: 25.0000 mg | ORAL_TABLET | Freq: Every day | ORAL | 3 refills | Status: DC

## 2019-10-28 MED ORDER — METFORMIN HCL ER 500 MG PO TB24: 1000.0000 mg | ORAL_TABLET | Freq: Every day | ORAL | 3 refills | Status: DC

## 2019-10-28 NOTE — Assessment & Plan Note (Signed)
Stable period on MTX and folate.

## 2019-10-28 NOTE — Assessment & Plan Note (Signed)
No recent gout flare

## 2019-10-28 NOTE — Progress Notes (Addendum)
This visit was conducted in person.  BP 136/84 (BP Location: Left Arm, Patient Position: Sitting, Cuff Size: Large)   Pulse 66   Temp 97.6 F (36.4 C) (Temporal)   Ht 5\' 9"  (1.753 m)   Wt 262 lb (118.8 kg)   SpO2 97%   BMI 38.69 kg/m   BP Readings from Last 3 Encounters:  10/28/19 136/84  05/23/19 108/60  05/09/19 132/84    CC: DM f/u visit  Subjective:    Patient ID: Joel Bennett, male    DOB: 08/05/1965, 54 y.o.   MRN: [id number]  HPI: Joel Bennett is a 54 y.o. male presenting on 10/28/2019 for Diabetes (Here for f/u.)   Saw cardiology 05/2019 - echo showing normal EF and diastolic dysfunction, mild aortic root dilatation at 4.2cm, coronary CTA with mild non obstructive CAD in mid to proximal LAD and calcium score of 735. ?microvascular disease - rec continue lipitor, imdur, aspirin.   On MTX weekly for seronegative RA  DM - does check sugars fasting about once a week - 170-180. Compliant with antihyperglycemic regimen which includes: jardiance 10mg  daily, amaryl 2mg  daily, metformin XR 1000mg  daily with breakfast. Denies low sugars or hypoglycemic symptoms. Denies paresthesias. Last diabetic eye exam DUE - upcoming scheduled. Pneumovax: DUE. Prevnar: not due. Glucometer brand: lifestyle. DSME: saw nutritionist 05/2019.  Lab Results  Component Value Date   HGBA1C 8.5 (A) 10/28/2019   Diabetic Foot Exam - Simple   No data filed     Lab Results  Component Value Date   MICROALBUR 0.8 02/20/2014   Declines COVID vaccines Declines pneumovax.      Relevant past medical, surgical, family and social history reviewed and updated as indicated. Interim medical history since our last visit reviewed. Allergies and medications reviewed and updated. Outpatient Medications Prior to Visit  Medication Sig Dispense Refill  . aspirin EC 81 MG tablet Take 1 tablet (81 mg total) by mouth daily.    Marland Kitchen atorvastatin (LIPITOR) 40 MG tablet Take 1 tablet (40 mg total) by mouth daily. 90  tablet 3  . folic acid (FOLVITE) 1 MG tablet Take 1 mg by mouth daily.    Marland Kitchen glucose blood (FREESTYLE LITE) test strip Use as instructed to check sugar 1-2 times daily as needed. Dx: E11.65 100 each 3  . indomethacin (INDOCIN) 50 MG capsule Take 50 mg by mouth 3 (three) times daily as needed.    . methotrexate (RHEUMATREX) 2.5 MG tablet Take 15 mg by mouth once a week.    Marland Kitchen oxyCODONE (OXY IR/ROXICODONE) 5 MG immediate release tablet     . empagliflozin (JARDIANCE) 10 MG TABS tablet Take 1 tablet (10 mg total) by mouth daily. 90 tablet 0  . glimepiride (AMARYL) 2 MG tablet Take 1 tablet (2 mg total) by mouth daily before breakfast. 90 tablet 3  . metFORMIN (GLUCOPHAGE-XR) 500 MG 24 hr tablet Take 2 tablets (1,000 mg total) by mouth daily with breakfast. 180 tablet 3  . olmesartan (BENICAR) 40 MG tablet Take 1 tablet (40 mg total) by mouth daily. 90 tablet 3  . ondansetron (ZOFRAN-ODT) 4 MG disintegrating tablet     . isosorbide mononitrate (IMDUR) 30 MG 24 hr tablet Take 1 tablet (30 mg total) by mouth daily. 90 tablet 3   No facility-administered medications prior to visit.     Per HPI unless specifically indicated in ROS section below Review of Systems Objective:  BP 136/84 (BP Location: Left Arm, Patient Position: Sitting, Cuff Size: Large)  Pulse 66   Temp 97.6 F (36.4 C) (Temporal)   Ht 5\' 9"  (1.753 m)   Wt 262 lb (118.8 kg)   SpO2 97%   BMI 38.69 kg/m   Wt Readings from Last 3 Encounters:  10/28/19 262 lb (118.8 kg)  06/09/19 257 lb (116.6 kg)  05/23/19 268 lb 8 oz (121.8 kg)      Physical Exam Vitals and nursing note reviewed.  Constitutional:      General: He is not in acute distress.    Appearance: Normal appearance. He is well-developed. He is obese. He is not ill-appearing.  HENT:     Head: Normocephalic and atraumatic.  Eyes:     Extraocular Movements: Extraocular movements intact.     Conjunctiva/sclera: Conjunctivae normal.     Pupils: Pupils are equal,  round, and reactive to light.  Cardiovascular:     Rate and Rhythm: Normal rate and regular rhythm.     Pulses: Normal pulses.     Heart sounds: Normal heart sounds. No murmur heard.   Pulmonary:     Effort: Pulmonary effort is normal. No respiratory distress.     Breath sounds: Normal breath sounds. No wheezing, rhonchi or rales.  Musculoskeletal:     Cervical back: Normal range of motion and neck supple.     Right lower leg: No edema.     Left lower leg: No edema.     Comments: See HPI for foot exam if done  Lymphadenopathy:     Cervical: No cervical adenopathy.  Neurological:     Mental Status: He is alert.       Results for orders placed or performed in visit on 10/28/19  POCT glycosylated hemoglobin (Hb A1C)  Result Value Ref Range   Hemoglobin A1C 8.5 (A) 4.0 - 5.6 %   HbA1c POC (<> result, manual entry)     HbA1c, POC (prediabetic range)     HbA1c, POC (controlled diabetic range)     Assessment & Plan:  This visit occurred during the SARS-CoV-2 public health emergency.  Safety protocols were in place, including screening questions prior to the visit, additional usage of staff PPE, and extensive cleaning of exam room while observing appropriate contact time as indicated for disinfecting solutions.   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 Continue amaryl, metformin XR> did not tolerate ozempic (nausea). Has seen nutritionist. RTC 4 mo DM f/u visit.       Relevant Medications   empagliflozin (JARDIANCE) 25 MG TABS tablet   olmesartan (BENICAR) 40 MG tablet   metFORMIN (GLUCOPHAGE-XR) 500 MG 24 hr tablet   glimepiride (AMARYL) 2 MG tablet   Other Relevant Orders   POCT glycosylated hemoglobin (Hb A1C) (Completed)   Seronegative rheumatoid arthritis (HCC)    Stable period on MTX and folate.       Hypertension associated with diabetes (HCC)     Chronic, stable. Continue current regimen.       Relevant Medications   empagliflozin (JARDIANCE) 25 MG TABS tablet   olmesartan (BENICAR) 40 MG tablet   metFORMIN (GLUCOPHAGE-XR) 500 MG 24 hr tablet   glimepiride (AMARYL) 2 MG tablet   Gout    No recent gout flare      CAD (coronary artery disease), native coronary artery    Reviewed recent cardiology note - appreciate cards care.  Relevant Medications   olmesartan (BENICAR) 40 MG tablet       Meds ordered this encounter  Medications  . empagliflozin (JARDIANCE) 25 MG TABS tablet    Sig: Take 1 tablet (25 mg total) by mouth daily.    Dispense:  90 tablet    Refill:  3    Note new dose  . olmesartan (BENICAR) 40 MG tablet    Sig: Take 1 tablet (40 mg total) by mouth daily.    Dispense:  90 tablet    Refill:  3  . metFORMIN (GLUCOPHAGE-XR) 500 MG 24 hr tablet    Sig: Take 2 tablets (1,000 mg total) by mouth daily with breakfast.    Dispense:  180 tablet    Refill:  3  . glimepiride (AMARYL) 2 MG tablet    Sig: Take 1 tablet (2 mg total) by mouth daily before breakfast.    Dispense:  90 tablet    Refill:  3   Orders Placed This Encounter  Procedures  . POCT glycosylated hemoglobin (Hb A1C)    Patient Instructions  Medicines refilled A1c was 8.5% today - goal <7% - increase jardiance to 25mg  daily - new dose at pharmacy.  Have eye doctor send me his report.  Return as needed or in 4 months for diabetes follow up visit.    Follow up plan: Return in about 4 months (around 02/27/2020) for follow up visit.  Ria Bush, MD

## 2019-10-28 NOTE — Patient Instructions (Addendum)
Medicines refilled A1c was 8.5% today - goal <7% - increase jardiance to 25mg  daily - new dose at pharmacy.  Have eye doctor send me his report.  Return as needed or in 4 months for diabetes follow up visit.

## 2019-10-28 NOTE — Assessment & Plan Note (Signed)
Reviewed recent cardiology note - appreciate cards care.

## 2019-10-28 NOTE — Assessment & Plan Note (Signed)
Chronic, stable. Continue current regimen. 

## 2019-10-28 NOTE — Assessment & Plan Note (Signed)
A1c remains elevated - will increase jardiance to 25mg  daily. Continue amaryl, metformin XR> did not tolerate ozempic (nausea). Has seen nutritionist. RTC 4 mo DM f/u visit.

## 2019-10-31 ENCOUNTER — Encounter: Payer: Self-pay | Admitting: Family Medicine

## 2019-10-31 DIAGNOSIS — E1140 Type 2 diabetes mellitus with diabetic neuropathy, unspecified: Secondary | ICD-10-CM

## 2019-10-31 DIAGNOSIS — IMO0002 Reserved for concepts with insufficient information to code with codable children: Secondary | ICD-10-CM

## 2019-11-10 MED ORDER — PIOGLITAZONE HCL 15 MG PO TABS
ORAL_TABLET | ORAL | 3 refills | Status: DC
Start: 2019-11-10 — End: 2020-11-02

## 2019-11-24 ENCOUNTER — Ambulatory Visit: Payer: BC Managed Care – PPO | Admitting: Cardiology

## 2019-11-25 NOTE — Telephone Encounter (Signed)
Please phone in due to E prescribing error.  

## 2019-11-25 NOTE — Telephone Encounter (Signed)
Refill left on vm at pharmacy.  

## 2019-11-25 NOTE — Telephone Encounter (Signed)
Noted  

## 2020-01-13 ENCOUNTER — Encounter: Payer: Self-pay | Admitting: Family Medicine

## 2020-01-23 ENCOUNTER — Telehealth: Payer: Self-pay

## 2020-01-23 DIAGNOSIS — U071 COVID-19: Secondary | ICD-10-CM

## 2020-01-23 HISTORY — DX: COVID-19: U07.1

## 2020-01-23 NOTE — Telephone Encounter (Signed)
Received MyChart message from pt's wife, stating pt tested positive for COVID today on home test.   Spoke with pt asking asking about sxs.  C/o fever- max 100.8, cough, HA, fatigue, and body aches.  Sxs started 01/21/20.  Says he was made aware of coworker that tested positive for COVID on 01/20/20.

## 2020-01-23 NOTE — Telephone Encounter (Addendum)
Spoke with patient.  Interested in mAb infusion. Sent info to infusion hotline.   Please place on COVID call list to check in every 2 days for symptoms.

## 2020-01-23 NOTE — Telephone Encounter (Addendum)
He would benefit from mAb infusion. Please offer this and if interested please phone in his demographic info to infusion hotline.   Recommend self quarantine for 10 days from first symptom onset, try to self isolate at home from wife - he use different side of house and different bathroom than wife to try and avoid spread.   Would offer virtual visit - let me know if interested.   Please place on COVID call list to check in every 2 days for symptoms.   To watch for new/worsening dyspnea which may be a sign he needs in-person evaluation at [facility]. Does he have pulse ox to monitor oxygen by chance?   Is wife having any symptoms? If so recommend she get tested.

## 2020-01-24 ENCOUNTER — Other Ambulatory Visit (HOSPITAL_COMMUNITY): Payer: Self-pay | Admitting: Oncology

## 2020-01-24 ENCOUNTER — Encounter: Payer: Self-pay | Admitting: Oncology

## 2020-01-24 ENCOUNTER — Ambulatory Visit (HOSPITAL_COMMUNITY)
Admission: RE | Admit: 2020-01-24 | Discharge: 2020-01-24 | Disposition: A | Discharge status: Home / Self Care | Payer: HRSA Program | Source: Ambulatory Visit | Attending: Pulmonary Disease | Admitting: Pulmonary Disease

## 2020-01-24 ENCOUNTER — Telehealth (HOSPITAL_COMMUNITY): Payer: Self-pay | Admitting: Oncology

## 2020-01-24 DIAGNOSIS — U071 COVID-19: Secondary | ICD-10-CM | POA: Diagnosis not present

## 2020-01-24 DIAGNOSIS — E119 Type 2 diabetes mellitus without complications: Secondary | ICD-10-CM | POA: Diagnosis not present

## 2020-01-24 MED ORDER — DIPHENHYDRAMINE HCL 50 MG/ML IJ SOLN: 50.0000 mg | Freq: Once | INTRAMUSCULAR | Status: DC | PRN

## 2020-01-24 MED ORDER — METHYLPREDNISOLONE SODIUM SUCC 125 MG IJ SOLR: 125.0000 mg | Freq: Once | INTRAMUSCULAR | Status: DC | PRN

## 2020-01-24 MED ORDER — FAMOTIDINE IN NACL 20-0.9 MG/50ML-% IV SOLN: 20.0000 mg | Freq: Once | INTRAVENOUS | Status: DC | PRN

## 2020-01-24 MED ORDER — ALBUTEROL SULFATE HFA 108 (90 BASE) MCG/ACT IN AERS: 2.0000 | INHALATION_SPRAY | Freq: Once | RESPIRATORY_TRACT | Status: DC | PRN

## 2020-01-24 MED ORDER — SOTROVIMAB 500 MG/8ML IV SOLN
500.0000 mg | Freq: Once | INTRAVENOUS | Status: AC
  Administered 2020-01-24: 500 mg via INTRAVENOUS

## 2020-01-24 MED ORDER — EPINEPHRINE 0.3 MG/0.3ML IJ SOAJ: 0.3000 mg | Freq: Once | INTRAMUSCULAR | Status: DC | PRN

## 2020-01-24 MED ORDER — SODIUM CHLORIDE 0.9 % IV SOLN: INTRAVENOUS | Status: DC | PRN

## 2020-01-24 NOTE — Telephone Encounter (Signed)
Noted.  Pt placed on Call Log.

## 2020-01-24 NOTE — Telephone Encounter (Signed)
I connected by phone with  Joel Bennett  to discuss the potential use of an new treatment for mild to moderate COVID-19 viral infection in non-hospitalized patients.   This patient is a age/sex that meets the FDA criteria for Emergency Use Authorization of casirivimab\imdevimab.  Has a (+) direct SARS-CoV-2 viral test result 1. Has mild or moderate COVID-19  2. Is ? 56 years of age and weighs ? 40 kg 3. Is NOT hospitalized due to COVID-19 4. Is NOT requiring oxygen therapy or requiring an increase in baseline oxygen flow rate due to COVID-19 5. Is within 10 days of symptom onset 6. Has at least one of the high risk factor(s) for progression to severe COVID-19 and/or hospitalization as defined in EUA. Specific high risk criteria:  Past Medical History:  Diagnosis Date  . Allergic rhinitis   . Allergy   . COVID-19 virus infection 01/2020  . DDD (degenerative disc disease), lumbar   . Diabetes mellitus without complication ([place])   . Diabetes type 2, uncontrolled ([place]) 2012  . Gout   . History of depression 2000   treated with meds  . Hypertension 1994  . Seronegative rheumatoid arthritis ([place]) 01/2019   MTX started 01/2019 - sees [place] rheum Marella Chimes)  . Stool incontinence 12/2011   neg giardia, neg O&P, neg iFOB from prior PCP  ?   Symptom onset  01/21/20   I have spoken and communicated the following to the patient or parent/caregiver:   1. FDA has authorized the emergency use of casirivimab\imdevimab for the treatment of mild to moderate COVID-19 in adults and pediatric patients with positive results of direct SARS-CoV-2 viral testing who are 109 years of age and older weighing at least 40 kg, and who are at high risk for progressing to severe COVID-19 and/or hospitalization.   2. The significant known and potential risks and benefits of casirivimab\imdevimab, and the extent to which such potential risks and benefits are unknown.   3. Information on available alternative  treatments and the risks and benefits of those alternatives, including clinical trials.   4. Patients treated with casirivimab\imdevimab should continue to self-isolate and use infection control measures (e.g., wear mask, isolate, social distance, avoid sharing personal items, clean and disinfect "high touch" surfaces, and frequent handwashing) according to CDC guidelines.    5. The patient or parent/caregiver has the option to accept or refuse casirivimab\imdevimab .   After reviewing this information with the patient, The patient agreed to proceed with receiving casirivimab\imdevimab infusion and will be provided a copy of the Fact sheet prior to receiving the infusion.Rulon Abide, AGNP-C [phone] ([place])

## 2020-01-24 NOTE — Progress Notes (Signed)
  Diagnosis: COVID-19  Physician: Asencion Noble, MD  Procedure: Sotrovimab Infusion   Complications: No immediate complications noted.  Discharge: Discharged home   Joel Bennett 01/24/2020

## 2020-01-24 NOTE — Discharge Instructions (Signed)

## 2020-01-25 NOTE — Telephone Encounter (Addendum)
Glad to hear. Looks like he got mAb infusion yesterday.  plz call again on Friday for update on symptoms.

## 2020-01-25 NOTE — Telephone Encounter (Signed)
Spoke with pt asking for an update.  States he still has cough but it is getting better.  No more body aches but has some soreness in chest and back when he coughs. Also has some nasal congestion.

## 2020-01-25 NOTE — Telephone Encounter (Signed)
Noted  

## 2020-01-30 NOTE — Telephone Encounter (Signed)
Spoke with pt asking for an update.  States he is feeling much better.  Only sxs to c/o is cough.  Says it was improving but last night cough seems to have gone backwards a little.  Other than that, he's doing good.

## 2020-02-01 ENCOUNTER — Encounter: Payer: Self-pay | Admitting: Family Medicine

## 2020-02-01 NOTE — Telephone Encounter (Signed)
Spoke with pt asking for update.  States he is doing great.  No more sinus sxs and barely has a cough now.  I relayed Dr. Synthia Innocent message.  Pt verbalizes understanding.

## 2020-02-01 NOTE — Telephone Encounter (Signed)
plz call again today for update. Today is day 11 from symptom onset, could lift quarantine today if fever free and continued improvement.

## 2020-02-01 NOTE — Telephone Encounter (Signed)
Noted! Thank you

## 2020-02-23 ENCOUNTER — Encounter: Payer: Self-pay | Admitting: Family Medicine

## 2020-02-23 ENCOUNTER — Other Ambulatory Visit: Payer: Self-pay

## 2020-02-23 ENCOUNTER — Telehealth (INDEPENDENT_AMBULATORY_CARE_PROVIDER_SITE_OTHER): Payer: Self-pay | Admitting: Family Medicine

## 2020-02-23 VITALS — Ht 69.0 in

## 2020-02-23 DIAGNOSIS — J0100 Acute maxillary sinusitis, unspecified: Secondary | ICD-10-CM

## 2020-02-23 MED ORDER — AMOXICILLIN-POT CLAVULANATE 875-125 MG PO TABS: 1.0000 | ORAL_TABLET | Freq: Two times a day (BID) | ORAL | 0 refills | Status: AC

## 2020-02-23 NOTE — Progress Notes (Signed)
A good morning     Joel Bennett T. Matsuko Kretz, MD Primary Care and Sports Medicine [facility] at [facility] [place], [address] Phone: [phone]  FAX: [phone]  Joel Bennett - 54 y.o. male  MRN [id number]  Date of Birth: 25-Jan-1966  Visit Date: 02/23/2020  PCP: Ria Bush, MD  Referred by: Ria Bush, MD Chief Complaint  Patient presents with  . Nasal Congestion  . Facial Pain   Virtual Visit via Video Note:  I connected with  Joel Bennett on 02/23/2020  8:00 AM EST by a video enabled telemedicine application and verified that I am speaking with the correct person using two identifiers.   Location patient: home computer, tablet, or smartphone Location provider: work or home office Consent: Verbal consent directly obtained from Joel Bennett. Persons participating in the virtual visit: patient, provider  I discussed the limitations of evaluation and management by telemedicine and the availability of in person appointments. The patient expressed understanding and agreed to proceed.  History of Present Illness:  Sinus pressure, some ST. Some earache.  No cough.    He has anterior facial pain as well as some pain in the left side of his face underneath his eye.  He has been having symptoms for approaching a couple of weeks.  He has had some mild earache originally and some mild sore throat.  He has not had any form of coughing, GI symptoms, rashes, or any other symptoms at all.  He also received an immunoglobulin infusion several weeks ago.  Immunization History  Administered Date(s) Administered  . Td 03/25/2007     Review of Systems as above: See pertinent positives and pertinent negatives per HPI No acute distress verbally   Observations/Objective/Exam:  An attempt was made to discern vital signs over the phone and per patient if applicable and possible.   General:    Alert, Oriented, appears well and in no  acute distress  Pulmonary:     On inspection no signs of respiratory distress.  Psych / Neurological:     Pleasant and cooperative.  Assessment and Plan:    ICD-10-CM   1. Acute non-recurrent maxillary sinusitis  J01.00    Will treat the patient for acute sinusitis.  In this setting with a very recent Covid infection, and immunoglobulin infusion, there is significant likelihood that he might have a false positive test, and regardless he has a very low likelihood of having COVID-19 secondary to very recent infection.  I discussed the assessment and treatment plan with the patient. The patient was provided an opportunity to ask questions and all were answered. The patient agreed with the plan and demonstrated an understanding of the instructions.   The patient was advised to call back or seek an in-person evaluation if the symptoms worsen or if the condition fails to improve as anticipated.  Follow-up: prn unless noted otherwise below No follow-ups on file.  Meds ordered this encounter  Medications  . amoxicillin-clavulanate (AUGMENTIN) 875-125 MG tablet    Sig: Take 1 tablet by mouth 2 (two) times daily for 10 days.    Dispense:  20 tablet    Refill:  0   No orders of the defined types were placed in this encounter.   Signed,  Maud Deed. Hadiyah Maricle, MD

## 2020-04-11 ENCOUNTER — Other Ambulatory Visit: Payer: Self-pay

## 2020-04-11 MED ORDER — ATORVASTATIN CALCIUM 40 MG PO TABS: 40.0000 mg | ORAL_TABLET | Freq: Every day | ORAL | 3 refills | Status: DC

## 2020-04-12 ENCOUNTER — Encounter: Payer: Self-pay | Admitting: Family Medicine

## 2020-05-08 ENCOUNTER — Encounter: Payer: Self-pay | Admitting: Family Medicine

## 2020-05-09 ENCOUNTER — Emergency Department: Payer: Self-pay

## 2020-05-09 ENCOUNTER — Other Ambulatory Visit: Payer: Self-pay

## 2020-05-09 ENCOUNTER — Encounter: Payer: Self-pay | Admitting: Emergency Medicine

## 2020-05-09 ENCOUNTER — Emergency Department
Admission: EM | Admit: 2020-05-09 | Discharge: 2020-05-09 | Disposition: A | Discharge status: Home / Self Care | Payer: Self-pay | Attending: Emergency Medicine | Admitting: Emergency Medicine

## 2020-05-09 DIAGNOSIS — R0789 Other chest pain: Secondary | ICD-10-CM | POA: Insufficient documentation

## 2020-05-09 DIAGNOSIS — R202 Paresthesia of skin: Secondary | ICD-10-CM | POA: Insufficient documentation

## 2020-05-09 DIAGNOSIS — I2510 Atherosclerotic heart disease of native coronary artery without angina pectoris: Secondary | ICD-10-CM | POA: Insufficient documentation

## 2020-05-09 DIAGNOSIS — Z79899 Other long term (current) drug therapy: Secondary | ICD-10-CM | POA: Insufficient documentation

## 2020-05-09 DIAGNOSIS — E1140 Type 2 diabetes mellitus with diabetic neuropathy, unspecified: Secondary | ICD-10-CM | POA: Insufficient documentation

## 2020-05-09 DIAGNOSIS — Z8616 Personal history of COVID-19: Secondary | ICD-10-CM | POA: Insufficient documentation

## 2020-05-09 DIAGNOSIS — Z7982 Long term (current) use of aspirin: Secondary | ICD-10-CM | POA: Insufficient documentation

## 2020-05-09 DIAGNOSIS — Z7984 Long term (current) use of oral hypoglycemic drugs: Secondary | ICD-10-CM | POA: Insufficient documentation

## 2020-05-09 DIAGNOSIS — I10 Essential (primary) hypertension: Secondary | ICD-10-CM | POA: Insufficient documentation

## 2020-05-09 DIAGNOSIS — M5412 Radiculopathy, cervical region: Secondary | ICD-10-CM | POA: Insufficient documentation

## 2020-05-09 DIAGNOSIS — M79601 Pain in right arm: Secondary | ICD-10-CM | POA: Insufficient documentation

## 2020-05-09 LAB — TROPONIN I (HIGH SENSITIVITY)
Troponin I (High Sensitivity): 4 ng/L (ref <18)
Troponin I (High Sensitivity): 4 ng/L (ref <18)

## 2020-05-09 LAB — BASIC METABOLIC PANEL
Anion gap: 11 (ref 5–15)
BUN: 9 mg/dL (ref 6–20)
CO2: 22 mmol/L (ref 22–32)
Calcium: 8.8 mg/dL — ABNORMAL LOW (ref 8.9–10.3)
Chloride: 102 mmol/L (ref 98–111)
Creatinine, Ser: 0.89 mg/dL (ref 0.61–1.24)
GFR, Estimated: >60 mL/min (ref >60)
Glucose, Bld: 312 mg/dL — ABNORMAL HIGH (ref 70–99)
Potassium: 4.2 mmol/L (ref 3.5–5.1)
Sodium: 135 mmol/L (ref 135–145)

## 2020-05-09 LAB — CBC
HCT: 45.2 % (ref 39.0–52.0)
Hemoglobin: 15.7 g/dL (ref 13.0–17.0)
MCH: 33.1 pg (ref 26.0–34.0)
MCHC: 34.7 g/dL (ref 30.0–36.0)
MCV: 95.4 fL (ref 80.0–100.0)
Platelets: 154 10*3/uL (ref 150–400)
RBC: 4.74 MIL/uL (ref 4.22–5.81)
RDW: 12.9 % (ref 11.5–15.5)
WBC: 7.4 10*3/uL (ref 4.0–10.5)
nRBC: 0 % (ref 0.0–0.2)

## 2020-05-09 MED ORDER — CYCLOBENZAPRINE HCL 10 MG PO TABS: 10.0000 mg | ORAL_TABLET | Freq: Three times a day (TID) | ORAL | 0 refills | Status: AC | PRN

## 2020-05-09 NOTE — ED Triage Notes (Signed)
Pt comes into the ED via POV c/o left side chest pain and central chest pain that has been ongoing for a week.  Pt denies any N/V/dizziness, but does have some SHOB.  Pt states he has also had numbness on the rights ide of the face and shoulder down into the elbow.  Pt explains that the numbness started 3 days ago.  Pt neurologically intact and is alert and oriented.  Pt ambulatory to triage and has even and unlabored respirations.

## 2020-05-10 MED ORDER — ISOSORBIDE MONONITRATE ER 30 MG PO TB24: 30.0000 mg | ORAL_TABLET | Freq: Every day | ORAL | 0 refills | Status: DC

## 2020-05-10 NOTE — ED Provider Notes (Signed)
[facility] Emergency Department Provider Note   ____________________________________________   Event Date/Time   First MD Initiated Contact with Patient 05/09/20 1632     (approximate)  I have reviewed the triage vital signs and the nursing notes.   HISTORY  Chief Complaint Chest Pain and Numbness    HPI Joel Bennett is a 55 y.o. male With stated past medical history of type 2 diabetes and hypertension who presents for right neck/arm/anterior chest for the past 3 days.  Patient states that he is also having paresthesias in this right arm to the elbow with any extension past this point.  Patient denies any associated dyspnea on exertion, shortness of breath, PND, orthopnea, nausea/vomiting/diarrhea, vision changes, or tinnitus.  Patient currently denies any vision changes, tinnitus, difficulty speaking, facial droop, sore throat, shortness of breath, abdominal pain, nausea/vomiting/diarrhea, dysuria, or weakness/numbness in any extremity         Past Medical History:  Diagnosis Date  . Allergic rhinitis   . Allergy   . COVID-19 virus infection 01/2020   s/p mAb infusion  . DDD (degenerative disc disease), lumbar   . Diabetes mellitus without complication ([place])   . Diabetes type 2, uncontrolled ([place]) 2012  . Gout   . History of depression 2000   treated with meds  . Hypertension 1994  . Seronegative rheumatoid arthritis ([place]) 01/2019   MTX started 01/2019 - sees [place] rheum Marella Chimes)  . Stool incontinence 12/2011   neg giardia, neg O&P, neg iFOB from prior PCP    Patient Active Problem List   Diagnosis Date Noted  . CAD (coronary artery disease), native coronary artery 05/09/2019  . Seronegative rheumatoid arthritis ([place]) 01/2019  . Dyslipidemia associated with type 2 diabetes mellitus ([place]) 08/11/2018  . Urge incontinence 08/09/2018  . Polymyalgia rheumatica ([place]) 04/08/2018  . Anxiety 10/08/2015  . Chest pain 07/20/2015  . Facial  paresthesia 12/26/2014  . 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 Abnormal sensation in left ear 01/23/2014  . Gout   . Allergic rhinitis   . DDD (degenerative disc disease), lumbar   . Hypertension associated with diabetes ([place])   . Uncontrolled type 2 diabetes with neuropathy [facility])     Past Surgical History:  Procedure Laterality Date  . ANKLE SURGERY Left 1999  . BACK SURGERY  1997   Lumbar  . CARDIOVASCULAR STRESS TEST  08/2015   WNL (Dr Oval Linsey)  . CARPAL TUNNEL RELEASE Left 05/2019   Dr Peggye Ley  . COLONOSCOPY  05/2014   TA several, rpt 3 yrs Ardis Hughs)  . COLONOSCOPY  11/2018   TA, rpt 7 yrs Ardis Hughs)  . KNEE SURGERY Right 1985  . SHOULDER SURGERY Left 2002   x 2  . Thumb Surgery Left    Joint Fusion on left hand    Prior to Admission medications   Medication Sig Start Date End Date Taking? Authorizing Provider  cyclobenzaprine (FLEXERIL) 10 MG tablet Take 1 tablet (10 mg total) by mouth 3 (three) times daily as needed for up to 4 days for muscle spasms (left chest pain). 05/09/20 05/13/20 Yes Naaman Plummer, MD  aspirin EC 81 MG tablet Take 1 tablet (81 mg total) by mouth daily. 02/04/19   Ria Bush, MD  atorvastatin (LIPITOR) 40 MG tablet Take 1 tablet (40 mg total) by mouth daily. 04/11/20   Kate Sable, MD  empagliflozin (JARDIANCE) 25 MG TABS tablet Take 1 tablet (25 mg total) by mouth daily. 10/28/19   Ria Bush,  MD  folic acid (FOLVITE) 1 MG tablet Take 1 mg by mouth daily. 02/15/19   [provider]  glimepiride (AMARYL) 2 MG tablet Take 1 tablet (2 mg total) by mouth daily before breakfast. 10/28/19   Ria Bush, MD  glucose blood (FREESTYLE LITE) test strip Use as instructed to check sugar 1-2 times daily as needed. Dx: E11.65 02/22/14   Ria Bush, MD  indomethacin (INDOCIN) 50 MG capsule Take 50 mg by mouth 3 (three) times daily as needed.    [provider]  isosorbide mononitrate (IMDUR) 30 MG 24 hr tablet  Take 1 tablet (30 mg total) by mouth daily. 05/10/20 08/08/20  Ria Bush, MD  metFORMIN (GLUCOPHAGE-XR) 500 MG 24 hr tablet Take 2 tablets (1,000 mg total) by mouth daily with breakfast. 10/28/19   Ria Bush, MD  methotrexate (RHEUMATREX) 2.5 MG tablet Take 15 mg by mouth once a week. 03/12/19   [provider]  olmesartan (BENICAR) 40 MG tablet Take 1 tablet (40 mg total) by mouth daily. 10/28/19   Ria Bush, MD  pioglitazone (ACTOS) 15 MG tablet Take 1 tablet (15 mg total) by mouth daily for 7 days, THEN 2 tablets (30 mg total) daily. 11/10/19 12/17/19  Ria Bush, MD    Allergies Ozempic (0.25 or 0.5 mg-dose) [semaglutide(0.25 or 0.5mg -dos)]  Family History  Problem Relation Age of Onset  . Cancer Maternal Aunt        lung (smoker)  . Rectal cancer Maternal Aunt   . Cancer Maternal Aunt        rectal  . Cancer Mother        lung (smoker)  . Cancer Paternal Grandmother        lung (smoker)  . CAD Paternal Grandmother        MI  . Cancer Other        paternal great uncle unsure type  . Alcohol abuse Paternal Grandfather   . Alcohol abuse Maternal Grandfather   . Alcohol abuse Paternal Uncle        and drugs  . CAD Paternal Uncle        MI  . CAD Paternal Aunt 2       MI  . Stroke Maternal Grandmother   . Stroke Cousin   . Diabetes Father   . Atrial fibrillation Father   . Diabetes Cousin   . Colon polyps Neg Hx   . Esophageal cancer Neg Hx   . Stomach cancer Neg Hx     Social History Social History   Tobacco Use  . Smoking status: Never Smoker  . Smokeless tobacco: Current User    Types: Snuff  Vaping Use  . Vaping Use: Never used  Substance Use Topics  . Alcohol use: Yes    Alcohol/week: 0.0 standard drinks    Comment: occasional  . Drug use: No    Review of Systems Constitutional: No fever/chills Eyes: No visual changes. ENT: No sore throat. Cardiovascular: Endorses chest pain. Respiratory: Denies shortness of  breath. Gastrointestinal: No abdominal pain.  No nausea, no vomiting.  No diarrhea. Genitourinary: Negative for dysuria. Musculoskeletal: Negative for acute arthralgias Skin: Negative for rash. Neurological: Endorses pain and paresthesias to the right neck and upper extremity.  Negative for headaches, weakness/numbness in any extremity Psychiatric: Negative for suicidal ideation/homicidal ideation   ____________________________________________   PHYSICAL EXAM:  VITAL SIGNS: ED Triage Vitals  Enc Vitals Group     BP 05/09/20 1519 (!) 149/93     Pulse  Rate 05/09/20 1519 88     Resp 05/09/20 1519 18     Temp 05/09/20 1519 98.9 F (37.2 C)     Temp Source 05/09/20 1519 Oral     SpO2 05/09/20 1519 97 %     Weight 05/09/20 1516 260 lb (117.9 kg)     Height 05/09/20 1516 5\' 9"  (1.753 m)     Head Circumference --      Peak Flow --      Pain Score 05/09/20 1516 2     Pain Loc --      Pain Edu? --      Excl. in [place]? --    Constitutional: Alert and oriented. Well appearing and in no acute distress. Eyes: Conjunctivae are normal. PERRL. Head: Atraumatic. Nose: No congestion/rhinnorhea. Mouth/Throat: Mucous membranes are moist. Neck: No stridor.  Positive Spurling test on the right Cardiovascular: Grossly normal heart sounds.  Good peripheral circulation. Respiratory: Normal respiratory effort.  No retractions. Gastrointestinal: Soft and nontender. No distention. Musculoskeletal: No obvious deformities Neurologic:  Normal speech and language. No gross focal neurologic deficits are appreciated. Skin:  Skin is warm and dry. No rash noted. Psychiatric: Mood and affect are normal. Speech and behavior are normal.  ____________________________________________   LABS (all labs ordered are listed, but only abnormal results are displayed)  Labs Reviewed  BASIC METABOLIC PANEL - Abnormal; Notable for the following components:      Result Value   Glucose, Bld 312 (*)    Calcium 8.8 (*)     All other components within normal limits  CBC  TROPONIN I (HIGH SENSITIVITY)  TROPONIN I (HIGH SENSITIVITY)   ____________________________________________  EKG  ED ECG REPORT I, Naaman Plummer, the attending physician, personally viewed and interpreted this ECG.  Date: 05/10/2020 EKG Time: 1508 Rate: 88 Rhythm: normal sinus rhythm QRS Axis: normal Intervals: normal ST/T Wave abnormalities: normal Narrative Interpretation: no evidence of acute ischemia  ____________________________________________  RADIOLOGY  ED MD interpretation: CT of the head without contrast shows no evidence of acute abnormalities including no intracranial hemorrhage, edema, or obvious masses  2 view chest x-ray shows no evidence of acute abnormalities including no pneumonia, pneumothorax, or widened mediastinum  Official radiology report(s): No results found.  ____________________________________________   PROCEDURES  Procedure(s) performed (including Critical Care):  .1-3 Lead EKG Interpretation Performed by: Naaman Plummer, MD Authorized by: Naaman Plummer, MD     Interpretation: normal     ECG rate:  84   ECG rate assessment: normal     Rhythm: sinus rhythm     Ectopy: none     Conduction: normal       ____________________________________________   INITIAL IMPRESSION / ASSESSMENT AND PLAN / ED COURSE  As part of my medical decision making, I reviewed the following data within the [facility] notes reviewed and incorporated, Labs reviewed, EKG interpreted, Old chart reviewed, Radiograph reviewed and Notes from prior ED visits reviewed and incorporated      + neck pain + sensation of aching and paresthesias.  ED Workup: Defer C-Spine imaging given negative by NEXUS criteria  Given History, Exam the patient appears to have a cervical radiculopathy.   Patient appears to be low risk for complications or other emergent conditions such as  anginal  equivalent, frank cervical instability, arterial dissection, osteomyelitis, epidural abscess, central cord syndrome, c-spine fracture, other spinal emergencies  Rx: NSAIDs, outpatient physical therapy evaluation and recommendation for home exercises in the interim  Disposition: Discharge. The patient has been given strict return precautions and understands the need to follow up within 48 hours with their primary care provider      ____________________________________________   FINAL CLINICAL IMPRESSION(S) / ED DIAGNOSES  Final diagnoses:  Cervical radiculopathy     ED Discharge Orders         Ordered    cyclobenzaprine (FLEXERIL) 10 MG tablet  3 times daily PRN        05/09/20 1823           Note:  This document was prepared using Dragon voice recognition software and may include unintentional dictation errors.   Naaman Plummer, MD 05/10/20 1901

## 2020-05-10 NOTE — Addendum Note (Signed)
Addended by: Ria Bush on: 05/10/2020 10:04 AM   Modules accepted: Orders

## 2020-05-10 NOTE — Telephone Encounter (Signed)
Looks like isosorbide is prescribed by [facility].  Do you want pt to contact them for refill or just send it in?  Also, he c/x last appt with them in 11/2019, not r/s.  Last seen at [facility] 05/2019.

## 2020-08-07 ENCOUNTER — Other Ambulatory Visit: Payer: Self-pay | Admitting: Family Medicine

## 2020-09-15 IMAGING — CT CT HEART MORP W/ CTA COR W/ SCORE W/ CA W/CM &/OR W/O CM
2 of 10 series · 8 of 20 positions shown, 9 images · non-contrast
Comparison: 02/09/2019 chest radiograph.

Addendum:
CLINICAL DATA: 52 yo male with chest pain

EXAM:
Cardiac/Coronary  CT
TECHNIQUE: The patient was scanned on a [employer] Force scanner.

[Series 21: multiphase % cta coronary 0.60 · axial · 0.43mm/px · z∈[-1513,-1433]mm · 4 of 2324 slices shown, 5 images]
[im 465/2324  vessel]
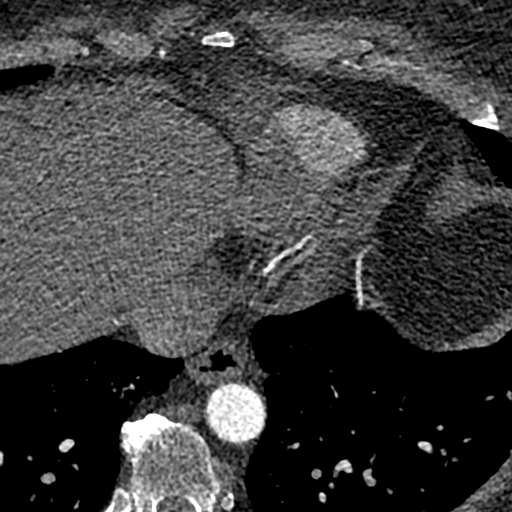
[im 465/2324  lung]
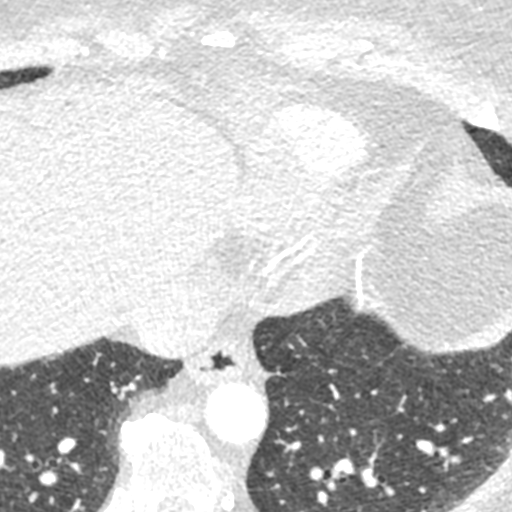
[im 930/2324  vessel]
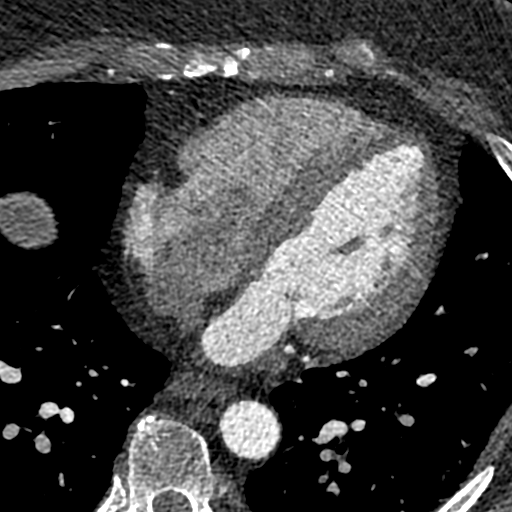
[im 1394/2324  vessel]
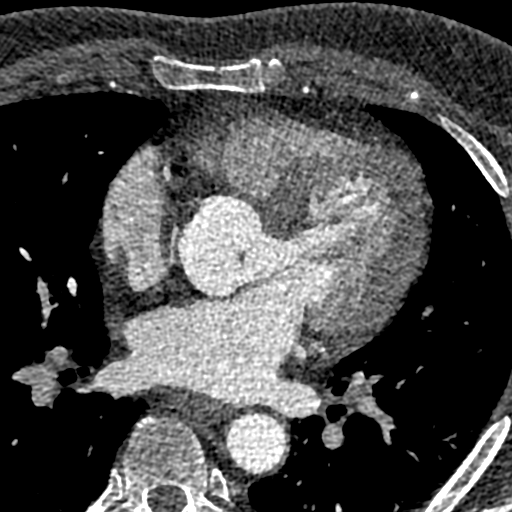
[im 1859/2324  vessel]
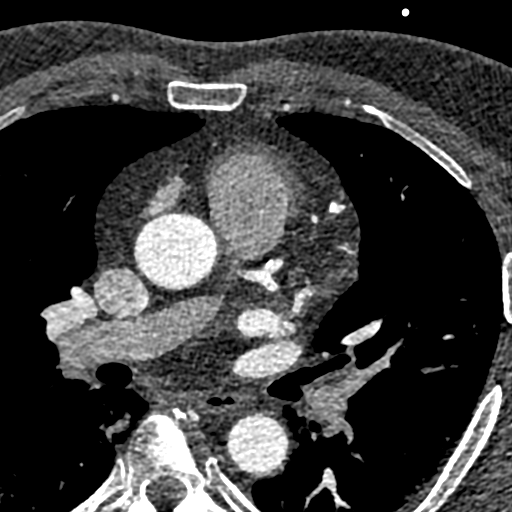

[Series 38: ms multiphase cta coronary 0.60 · axial · 0.43mm/px · z∈[-1513,-1433]mm · 4 of 2324 slices shown]
[im 465/2324  vessel]
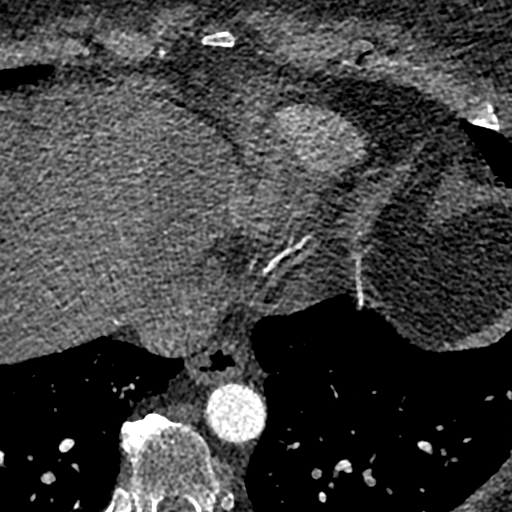
[im 930/2324  vessel]
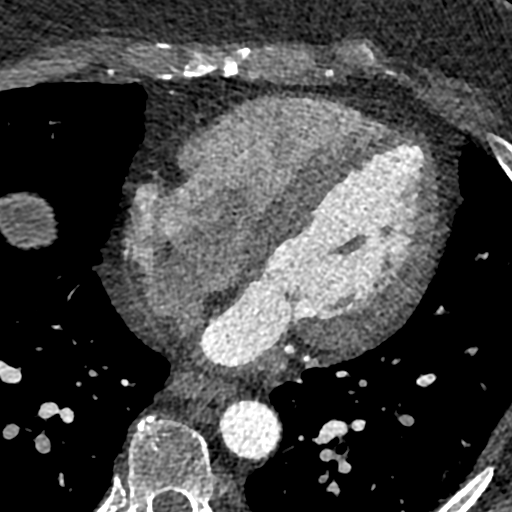
[im 1394/2324  vessel]
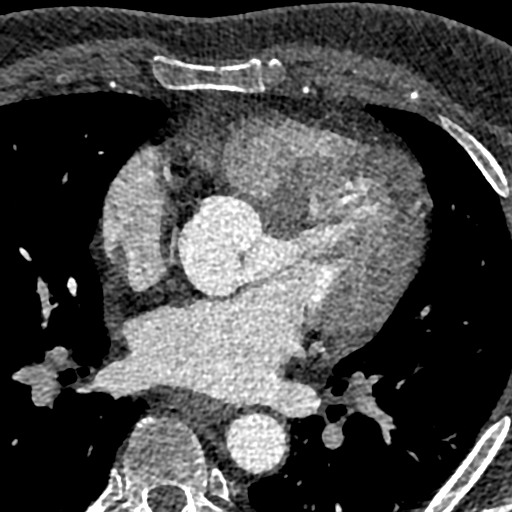
[im 1859/2324  vessel]
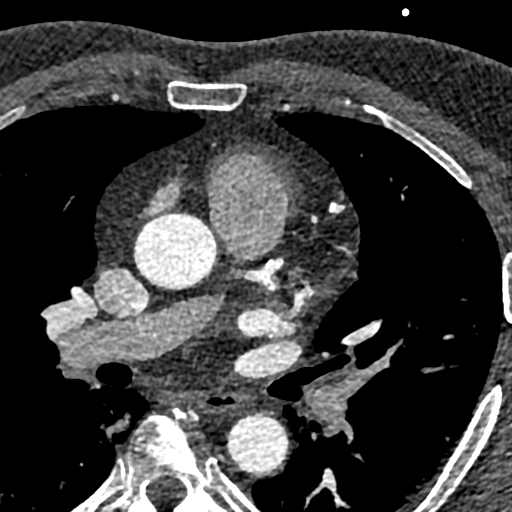

[8 of 20 positions shown; findings below may reference images not displayed]



Aorta:  Normal size.  No calcifications.  No dissection.

Aortic Valve:  Trileaflet.  No calcifications.

Coronary Arteries:  Normal coronary origin.  Left dominance.

RCA is a small artery.  There is no plaque.

Left main is a large artery that gives rise to LAD and LCX arteries.

LAD is a large vessel with small D1 and larger D2. Minimal calcified
plaque (0-24%) stenosis noted in proximal vessel follow by mild
stenosis (25-49%); mild calcified plaque (25-49%) stenosis noted in
mid LAD.

LCX is a dominant artery that gives rise to two smaller OMs and and
third larger OM. There is minimal calcified plaque (0-24%) in the
proximal vessel.

Other findings:

Normal pulmonary vein drainage into the left atrium.

Normal let atrial appendage without a thrombus.

Normal size of the pulmonary artery.
IMPRESSION: 1. Coronary calcium score of 735. This was 98 percentile for age and
sex matched control.

2. Normal coronary origin with left dominance.

3. Mild CAD with 25-49% calcified stenosis in the proximal and mid
LAD ([id number]).

4. See separate report from radiology for noncardiac structures

Chamsedin Serina

EXAM:
OVER-READ INTERPRETATION  CT CHEST

The following report is an over-read performed by radiologist Dr.
does not include interpretation of cardiac or coronary anatomy or
pathology. The coronary CTA interpretation by the cardiologist is
attached.
FINDINGS: Cardiovascular: Top-normal heart size. No significant pericardial
effusion/thickening. Normal course and caliber of the visualized
thoracic aorta. Top-normal caliber main pulmonary artery (3.1 cm
diameter). No central pulmonary emboli.

Mediastinum/Nodes: Unremarkable esophagus. No pathologically
enlarged mediastinal or hilar lymph nodes. Nonenlarged coarsely
calcified granulomatous right hilar nodes.

Lungs/Pleura: No pneumothorax. No pleural effusion. Coarsely
calcified 13 mm medial right lower lobe granuloma. No acute
consolidative airspace disease or lung masses. Posterior right
middle lobe 2 mm subpleural pulmonary nodule (series 7/image 25). No
additional significant pulmonary nodules.

Upper abdomen: No acute abnormality.

Musculoskeletal: No aggressive appearing focal osseous lesions. Mild
lower thoracic spondylosis.
IMPRESSION: Right middle lobe 2 mm subpleural solid pulmonary nodule. No
follow-up needed if patient is low-risk. Non-contrast chest CT can
be considered in 12 months if patient is high-risk. This
recommendation follows the consensus statement: Guidelines for
Management of Incidental Pulmonary Nodules Detected on CT
Images:From the [HOSPITAL] 2519; published online before
print (10.1148/radiol.[id number]).

*** End of Addendum ***
FINDINGS: A 120 kV prospective scan was triggered in the descending thoracic
aorta at 111 HU's. Axial non-contrast 3 mm slices were carried out
through the heart. The data set was analyzed on a dedicated work
station and scored using the Agatson method. Gantry rotation speed
was 250 msecs and collimation was .6 mm. No beta blockade and 0.8 mg
of sl NTG was given. The 3D data set was reconstructed in 5%
intervals of the 67-82 % of the R-R cycle. Diastolic phases were
analyzed on a dedicated work station using MPR, MIP and VRT modes.
The patient received 80 cc of contrast.

Aorta:  Normal size.  No calcifications.  No dissection.

Aortic Valve:  Trileaflet.  No calcifications.

Coronary Arteries:  Normal coronary origin.  Left dominance.

RCA is a small artery.  There is no plaque.

Left main is a large artery that gives rise to LAD and LCX arteries.

LAD is a large vessel with small D1 and larger D2. Minimal calcified
plaque (0-24%) stenosis noted in proximal vessel follow by mild
stenosis (25-49%); mild calcified plaque (25-49%) stenosis noted in
mid LAD.

LCX is a dominant artery that gives rise to two smaller OMs and and
third larger OM. There is minimal calcified plaque (0-24%) in the
proximal vessel.

Other findings:

Normal pulmonary vein drainage into the left atrium.

Normal let atrial appendage without a thrombus.

Normal size of the pulmonary artery.
IMPRESSION: 1. Coronary calcium score of 735. This was 98 percentile for age and
sex matched control.

2. Normal coronary origin with left dominance.

3. Mild CAD with 25-49% calcified stenosis in the proximal and mid
LAD ([id number]).

4. See separate report from radiology for noncardiac structures

Chamsedin Serina

## 2020-10-12 ENCOUNTER — Ambulatory Visit: Payer: Self-pay | Admitting: Family Medicine

## 2020-10-30 ENCOUNTER — Other Ambulatory Visit: Payer: Self-pay | Admitting: Family Medicine

## 2020-11-02 ENCOUNTER — Encounter: Payer: Self-pay | Admitting: Family Medicine

## 2020-11-02 ENCOUNTER — Other Ambulatory Visit: Payer: Self-pay

## 2020-11-02 ENCOUNTER — Ambulatory Visit (INDEPENDENT_AMBULATORY_CARE_PROVIDER_SITE_OTHER): Payer: Self-pay | Admitting: Family Medicine

## 2020-11-02 VITALS — BP 140/86 | HR 71 | Temp 97.5°F | Ht 69.0 in | Wt 253.5 lb

## 2020-11-02 DIAGNOSIS — E1140 Type 2 diabetes mellitus with diabetic neuropathy, unspecified: Secondary | ICD-10-CM

## 2020-11-02 DIAGNOSIS — M5412 Radiculopathy, cervical region: Secondary | ICD-10-CM | POA: Insufficient documentation

## 2020-11-02 DIAGNOSIS — M25511 Pain in right shoulder: Secondary | ICD-10-CM | POA: Insufficient documentation

## 2020-11-02 DIAGNOSIS — E1159 Type 2 diabetes mellitus with other circulatory complications: Secondary | ICD-10-CM

## 2020-11-02 DIAGNOSIS — E1169 Type 2 diabetes mellitus with other specified complication: Secondary | ICD-10-CM

## 2020-11-02 DIAGNOSIS — I152 Hypertension secondary to endocrine disorders: Secondary | ICD-10-CM

## 2020-11-02 DIAGNOSIS — I25119 Atherosclerotic heart disease of native coronary artery with unspecified angina pectoris: Secondary | ICD-10-CM

## 2020-11-02 DIAGNOSIS — E785 Hyperlipidemia, unspecified: Secondary | ICD-10-CM

## 2020-11-02 DIAGNOSIS — M1A9XX0 Chronic gout, unspecified, without tophus (tophi): Secondary | ICD-10-CM

## 2020-11-02 DIAGNOSIS — E1165 Type 2 diabetes mellitus with hyperglycemia: Secondary | ICD-10-CM

## 2020-11-02 DIAGNOSIS — IMO0002 Reserved for concepts with insufficient information to code with codable children: Secondary | ICD-10-CM

## 2020-11-02 LAB — COMPREHENSIVE METABOLIC PANEL
ALT: 51 U/L (ref 0–53)
AST: 46 U/L — ABNORMAL HIGH (ref 0–37)
Albumin: 4.1 g/dL (ref 3.5–5.2)
Alkaline Phosphatase: 66 U/L (ref 39–117)
BUN: 12 mg/dL (ref 6–23)
CO2: 25 mEq/L (ref 19–32)
Calcium: 9.1 mg/dL (ref 8.4–10.5)
Chloride: 103 mEq/L (ref 96–112)
Creatinine, Ser: 0.82 mg/dL (ref 0.40–1.50)
GFR: 98.06 mL/min (ref >60.00)
Glucose, Bld: 266 mg/dL — ABNORMAL HIGH (ref 70–99)
Potassium: 4.5 mEq/L (ref 3.5–5.1)
Sodium: 138 mEq/L (ref 135–145)
Total Bilirubin: 0.6 mg/dL (ref 0.2–1.2)
Total Protein: 6.7 g/dL (ref 6.0–8.3)

## 2020-11-02 LAB — LIPID PANEL
Cholesterol: 180 mg/dL (ref 0–200)
HDL: 32.9 mg/dL — ABNORMAL LOW (ref >39.00)
NonHDL: 147.44
Total CHOL/HDL Ratio: 5
Triglycerides: 327 mg/dL — ABNORMAL HIGH (ref 0.0–149.0)
VLDL: 65.4 mg/dL — ABNORMAL HIGH (ref 0.0–40.0)

## 2020-11-02 LAB — POCT GLYCOSYLATED HEMOGLOBIN (HGB A1C): Hemoglobin A1C: 11.5 % — AB (ref 4.0–5.6)

## 2020-11-02 LAB — LDL CHOLESTEROL, DIRECT: Direct LDL: 112 mg/dL

## 2020-11-02 LAB — URIC ACID: Uric Acid, Serum: 6.7 mg/dL (ref 4.0–7.8)

## 2020-11-02 LAB — VITAMIN B12: Vitamin B-12: 218 pg/mL (ref 211–911)

## 2020-11-02 MED ORDER — OLMESARTAN MEDOXOMIL 40 MG PO TABS
40.0000 mg | ORAL_TABLET | Freq: Every day | ORAL | 3 refills | Status: DC
Start: 2020-11-02 — End: 2021-01-02

## 2020-11-02 MED ORDER — PIOGLITAZONE HCL 15 MG PO TABS: 15.0000 mg | ORAL_TABLET | Freq: Every day | ORAL | 6 refills | Status: DC

## 2020-11-02 MED ORDER — RYBELSUS 7 MG PO TABS: 7.0000 mg | ORAL_TABLET | Freq: Every day | ORAL | 3 refills | Status: DC

## 2020-11-02 MED ORDER — RYBELSUS 3 MG PO TABS: 3.0000 mg | ORAL_TABLET | Freq: Every day | ORAL | 0 refills | Status: DC

## 2020-11-02 MED ORDER — ATORVASTATIN CALCIUM 40 MG PO TABS: 40.0000 mg | ORAL_TABLET | Freq: Every day | ORAL | 3 refills | Status: DC

## 2020-11-02 NOTE — Assessment & Plan Note (Signed)
Saw cards, does not want to return to prior cardiologist. Remains asxs - will monitor symptoms for now.

## 2020-11-02 NOTE — Assessment & Plan Note (Signed)
Chronic, stable only on olmesartan - continue. Denies chest symptoms at this time - will stay off long acting nitrate.

## 2020-11-02 NOTE — Patient Instructions (Addendum)
Labs today Let us know if any chest symptoms develop to restart isosorbide.  Restart actos '15mg'$  daily as well as price out rybelsus '3mg'$  for the first month then '7mg'$  daily. Let me know if any trouble filling Rybelsus.  Restart atorvastatin '40mg'$  daily Let me know if shoulder and foot pain not continuing to improve, or if any nerve pain symptoms (burning pain).  Schedule eye exam at your convenience  Return in 4 months for diabetes follow up

## 2020-11-02 NOTE — Progress Notes (Signed)
Patient ID: Joel Bennett, male    DOB: 1965-09-03, 55 y.o.   MRN: [id number]  This visit was conducted in person.  BP 140/86   Pulse 71   Temp (!) 97.5 F (36.4 C) (Temporal)   Ht '5\' 9"'$  (1.753 m)   Wt 253 lb 8 oz (115 kg)   SpO2 97%   BMI 37.44 kg/m    CC: DM f/u, R shoulder pain Subjective:   HPI: Joel Bennett is a 55 y.o. male presenting on 11/02/2020 for Diabetes (Here for f/u.  Wants to discuss metformin. ) and Shoulder Pain (C/o right shoulder pain.  Started several mos ago.  Has started to improve.  Tried OTC pain meds.  Indomethacin is helping. )   Last seen 10/2019.   R shoulder pain and L foot pain at heel and ball sensation at ball of foot - ongoing for several months now. Points to R posterior shoulder into neck, also noticing paresthesias to R shoulder with radiation to neck and up into face. Taking indocin with improvement. Denies inciting trauma/injury or fall. Sitting at computer at work with arms elevated reproduces tingling sensation to R shoulder/neck. No shooting pain down arms.   H/o seronegative rheumatoid arthritis last saw rheumatology Marella Chimes) 04/2019 - was on MTX weekly. H/o PMR 09/2017. Has not returned to rheum. Hand swelling/redness and pain has largely resolved.   HTN - continues olmesartan '40mg'$  daily. Stopped isosorbide as cardiology would not refill when he stopped returning for f/u (last seen 05/2019). Coronary calcium score at that time 735, coronary CTA showed mild nonobstructive CAD in mid to proximal LAD. He also stopped atorvastatin. Did not like cardiologist. Home BP running better than today.   DM - does regularly check sugars several times a week, up to 240. Stopped diabetes medications (actos, metformin XR). Ongoing loose stools, wants to change metformin XR. Vania Rea was unaffordable. Felt sick with ozempic. Denies low sugars or hypoglycemic symptoms. Denies paresthesias. Last diabetic eye exam DUE. Glucometer brand: Freestyle Lite. Last  foot exam: today. DSME: states completed. No smoking history.  Lab Results  Component Value Date   HGBA1C 11.5 (A) 11/02/2020   Diabetic Foot Exam - Simple   Simple Foot Form Diabetic Foot exam was performed with the following findings: Yes 11/02/2020  8:32 AM  Visual Inspection See comments: Yes Sensation Testing Intact to touch and monofilament testing bilaterally: Yes Pulse Check Posterior Tibialis and Dorsalis pulse intact bilaterally: Yes Comments Maceration between L 4th/5th toes 2+ DP bilaterally Dry skin to bilateral soles    Lab Results  Component Value Date   MICROALBUR 0.8 02/20/2014        Relevant past medical, surgical, family and social history reviewed and updated as indicated. Interim medical history since our last visit reviewed. Allergies and medications reviewed and updated. Outpatient Medications Prior to Visit  Medication Sig Dispense Refill   aspirin EC 81 MG tablet Take 1 tablet (81 mg total) by mouth daily.     glucose blood (FREESTYLE LITE) test strip Use as instructed to check sugar 1-2 times daily as needed. Dx: E11.65 100 each 3   indomethacin (INDOCIN) 50 MG capsule Take 50 mg by mouth 3 (three) times daily as needed.     metFORMIN (GLUCOPHAGE-XR) 500 MG 24 hr tablet TAKE 2 TABLETS BY MOUTH EVERY DAY WITH BREAKFAST 180 tablet 0   methotrexate (RHEUMATREX) 2.5 MG tablet Take 15 mg by mouth once a week.     olmesartan (BENICAR) 40 MG  tablet Take 1 tablet (40 mg total) by mouth daily. 90 tablet 3   atorvastatin (LIPITOR) 40 MG tablet Take 1 tablet (40 mg total) by mouth daily. 90 tablet 3   empagliflozin (JARDIANCE) 25 MG TABS tablet Take 1 tablet (25 mg total) by mouth daily. 90 tablet 3   folic acid (FOLVITE) 1 MG tablet Take 1 mg by mouth daily.     glimepiride (AMARYL) 2 MG tablet Take 1 tablet (2 mg total) by mouth daily before breakfast. 90 tablet 3   isosorbide mononitrate (IMDUR) 30 MG 24 hr tablet Take 1 tablet (30 mg total) by mouth daily.  90 tablet 0   pioglitazone (ACTOS) 15 MG tablet Take 1 tablet (15 mg total) by mouth daily for 7 days, THEN 2 tablets (30 mg total) daily. 60 tablet 3   No facility-administered medications prior to visit.     Per HPI unless specifically indicated in ROS section below Review of Systems  Objective:  BP 140/86   Pulse 71   Temp (!) 97.5 F (36.4 C) (Temporal)   Ht '5\' 9"'$  (1.753 m)   Wt 253 lb 8 oz (115 kg)   SpO2 97%   BMI 37.44 kg/m   Wt Readings from Last 3 Encounters:  11/02/20 253 lb 8 oz (115 kg)  05/09/20 260 lb (117.9 kg)  10/28/19 262 lb (118.8 kg)      Physical Exam Vitals and nursing note reviewed.  Constitutional:      Appearance: Normal appearance. He is not ill-appearing.  Eyes:     Extraocular Movements: Extraocular movements intact.     Conjunctiva/sclera: Conjunctivae normal.     Pupils: Pupils are equal, round, and reactive to light.  Neck:     Comments:  Neg spurling FROM at neck, discomfort to R neck with lateral flexion bilaterally  Cardiovascular:     Rate and Rhythm: Normal rate and regular rhythm.     Pulses: Normal pulses.     Heart sounds: Normal heart sounds. No murmur heard. Pulmonary:     Effort: Pulmonary effort is normal. No respiratory distress.     Breath sounds: Normal breath sounds. No wheezing, rhonchi or rales.  Musculoskeletal:     Right lower leg: No edema.     Left lower leg: No edema.     Comments:  See HPI for foot exam if done Neg calc squeeze bilaterally No evidence of morton neuroma  L shoulder WNL R shoulder exam: No deformity of shoulders on inspection. No pain with palpation of shoulder landmarks. FROM in abduction and forward flexion. No pain or weakness with testing SITS in ext/int rotation. No pain with empty can sign. Neg Yerguson, Speed test. No impingement. No pain with crossover test. No pain with rotation of humeral head in [place] joint.   Skin:    General: Skin is warm and dry.     Findings: No rash.   Neurological:     Mental Status: He is alert.  Psychiatric:        Mood and Affect: Mood normal.        Behavior: Behavior normal.      Results for orders placed or performed in visit on 11/02/20  POCT glycosylated hemoglobin (Hb A1C)  Result Value Ref Range   Hemoglobin A1C 11.5 (A) 4.0 - 5.6 %   HbA1c POC (<> result, manual entry)     HbA1c, POC (prediabetic range)     HbA1c, POC (controlled diabetic range)  Assessment & Plan:  This visit occurred during the SARS-CoV-2 public health emergency.  Safety protocols were in place, including screening questions prior to the visit, additional usage of staff PPE, and extensive cleaning of exam room while observing appropriate contact time as indicated for disinfecting solutions.   Problem List Items Addressed This Visit     Gout   Relevant Orders   Uric acid   Hypertension associated with diabetes ([place])    Chronic, stable only on olmesartan - continue. Denies chest symptoms at this time - will stay off long acting nitrate.       Relevant Medications   olmesartan (BENICAR) 40 MG tablet   atorvastatin (LIPITOR) 40 MG tablet   pioglitazone (ACTOS) 15 MG tablet   Semaglutide (RYBELSUS) 3 MG TABS   Semaglutide (RYBELSUS) 7 MG TABS   Uncontrolled type 2 diabetes with neuropathy (HCC) - Primary    Chronic, deteriorated. Wants to stop metformin XR due to ongoing loose stools. Will price out rybelsus and if unaffordable, consider daily victoza. Previous ozempic caused nausea. Will also restart actos '15mg'$  daily.  He agrees to return in 4 months for f/u visit, but I did ask him to keep [place] updated via [place].       Relevant Medications   olmesartan (BENICAR) 40 MG tablet   atorvastatin (LIPITOR) 40 MG tablet   pioglitazone (ACTOS) 15 MG tablet   Semaglutide (RYBELSUS) 3 MG TABS   Semaglutide (RYBELSUS) 7 MG TABS   Other Relevant Orders   POCT glycosylated hemoglobin (Hb A1C) (Completed)   Lipid panel   Comprehensive metabolic  panel   Vitamin B12   Dyslipidemia associated with type 2 diabetes mellitus ([place])    Off statin - will restart atorvastatin '40mg'$  daily. Reviewed importance of cholesterol control to prevent progression of known CAD The 10-year ASCVD risk score Mikey Bussing DC Jr., et al., 2013) is: 16.5%   Values used to calculate the score:     Age: 36 years     Sex: Male     Is Non-Hispanic African American: No     Diabetic: Yes     Tobacco smoker: No     Systolic Blood Pressure: [id number] mmHg     Is BP treated: Yes     HDL Cholesterol: 36.4 mg/dL     Total Cholesterol: 167 mg/dL       Relevant Medications   olmesartan (BENICAR) 40 MG tablet   atorvastatin (LIPITOR) 40 MG tablet   pioglitazone (ACTOS) 15 MG tablet   Semaglutide (RYBELSUS) 3 MG TABS   Semaglutide (RYBELSUS) 7 MG TABS   CAD (coronary artery disease), native coronary artery    Saw cards, does not want to return to prior cardiologist. Remains asxs - will monitor symptoms for now.       Relevant Medications   olmesartan (BENICAR) 40 MG tablet   atorvastatin (LIPITOR) 40 MG tablet   Right shoulder pain    With paresthesias of R shoulder into R face suggestive of cervical radiculitis. Pt has been treating with indocin and is noticing improvement. Will continue to monitor for now. Check B12, discussed possible gabapentin trial.         Meds ordered this encounter  Medications   olmesartan (BENICAR) 40 MG tablet    Sig: Take 1 tablet (40 mg total) by mouth daily.    Dispense:  90 tablet    Refill:  3   atorvastatin (LIPITOR) 40 MG tablet    Sig: Take 1 tablet (40 mg  total) by mouth daily.    Dispense:  90 tablet    Refill:  3   pioglitazone (ACTOS) 15 MG tablet    Sig: Take 1 tablet (15 mg total) by mouth daily.    Dispense:  30 tablet    Refill:  6   Semaglutide (RYBELSUS) 3 MG TABS    Sig: Take 3 mg by mouth daily.    Dispense:  30 tablet    Refill:  0    <SAMPLE>   Semaglutide (RYBELSUS) 7 MG TABS    Sig: Take 7 mg by mouth  daily. Take on an empty stomach, 30 to 60 minutes before the first food, beverage, or other medications with no more than 4 oz of plain water.    Dispense:  30 tablet    Refill:  3    Start after the first '3mg'$  month    Orders Placed This Encounter  Procedures   Lipid panel   Comprehensive metabolic panel   Vitamin [id number]   Uric acid   POCT glycosylated hemoglobin (Hb A1C)     Patient Instructions  Labs today Let us know if any chest symptoms develop to restart isosorbide.  Restart actos '15mg'$  daily as well as price out rybelsus '3mg'$  for the first month then '7mg'$  daily. Let me know if any trouble filling Rybelsus.  Restart atorvastatin '40mg'$  daily Let me know if shoulder and foot pain not continuing to improve, or if any nerve pain symptoms (burning pain).  Schedule eye exam at your convenience  Return in 4 months for diabetes follow up   Follow up plan: Return in about 4 months (around 03/04/2021) for follow up visit.  Ria Bush, MD

## 2020-11-02 NOTE — Assessment & Plan Note (Addendum)
Chronic, deteriorated. Wants to stop metformin XR due to ongoing loose stools. Will price out rybelsus and if unaffordable, consider daily victoza. Previous ozempic caused nausea. Will also restart actos '15mg'$  daily.  He agrees to return in 4 months for f/u visit, but I did ask him to keep [place] updated via [place].

## 2020-11-02 NOTE — Assessment & Plan Note (Addendum)
With paresthesias of R shoulder into R face suggestive of cervical radiculitis. Pt has been treating with indocin and is noticing improvement. Will continue to monitor for now. Check B12, discussed possible gabapentin trial.

## 2020-11-02 NOTE — Assessment & Plan Note (Signed)
Off statin - will restart atorvastatin '40mg'$  daily. Reviewed importance of cholesterol control to prevent progression of known CAD The 10-year ASCVD risk score Mikey Bussing DC Brooke Bonito., et al., 2013) is: 16.5%   Values used to calculate the score:     Age: 55 years     Sex: Male     Is Non-Hispanic African American: No     Diabetic: Yes     Tobacco smoker: No     Systolic Blood Pressure: [id number] mmHg     Is BP treated: Yes     HDL Cholesterol: 36.4 mg/dL     Total Cholesterol: 167 mg/dL

## 2020-11-03 ENCOUNTER — Other Ambulatory Visit: Payer: Self-pay | Admitting: Family Medicine

## 2020-11-03 MED ORDER — VITAMIN B-12 1000 MCG PO TABS: 1000.0000 ug | ORAL_TABLET | Freq: Every day | ORAL | Status: AC

## 2020-11-06 ENCOUNTER — Encounter: Payer: Self-pay | Admitting: Family Medicine

## 2020-11-06 NOTE — Telephone Encounter (Signed)
Spoke with [facility] asking about rx.  States they received Rybelsus 3 mg, however, pt doesn't have insurance on file with them.  And they show pt has been using GoodRx card for other rxs.  But it won't cover Rybelsus and pt's cost is about $900.00.

## 2020-11-08 NOTE — Telephone Encounter (Signed)
Yes, there is a patient assistance program. The income requirement is < 400% [employer] (, for 1 person household or 73,000 for 2 person). Let me know if you would like [place] to assist.

## 2020-11-08 NOTE — Telephone Encounter (Signed)
Correction: 54,000 for household of 1

## 2020-11-08 NOTE — Telephone Encounter (Addendum)
Question for Michelle/Lindsay -  Are there any options to get rybelsus covered for an uninsured patient? Pt assistance program?

## 2020-11-14 MED ORDER — PIOGLITAZONE HCL 30 MG PO TABS: 30.0000 mg | ORAL_TABLET | Freq: Every day | ORAL | 1 refills | Status: DC

## 2020-11-14 MED ORDER — GLIMEPIRIDE 2 MG PO TABS: 2.0000 mg | ORAL_TABLET | Freq: Every day | ORAL | 1 refills | Status: DC

## 2020-11-14 NOTE — Addendum Note (Signed)
Addended by: Ria Bush on: 11/14/2020 10:13 AM   Modules accepted: Orders

## 2020-11-16 MED ORDER — GABAPENTIN 300 MG PO CAPS: 300.0000 mg | ORAL_CAPSULE | Freq: Two times a day (BID) | ORAL | 3 refills | Status: DC

## 2020-11-16 NOTE — Addendum Note (Signed)
Addended by: Ria Bush on: 11/16/2020 07:41 AM   Modules accepted: Orders

## 2020-12-16 ENCOUNTER — Encounter: Payer: Self-pay | Admitting: Family Medicine

## 2020-12-17 MED ORDER — GABAPENTIN 300 MG PO CAPS: 300.0000 mg | ORAL_CAPSULE | Freq: Two times a day (BID) | ORAL | 3 refills | Status: DC

## 2020-12-17 MED ORDER — PIOGLITAZONE HCL 30 MG PO TABS
30.0000 mg | ORAL_TABLET | Freq: Every day | ORAL | 1 refills | Status: DC
Start: 2020-12-17 — End: 2021-06-17

## 2020-12-17 NOTE — Telephone Encounter (Signed)
Gabapentin Last rx:  11/16/20, #60 Last OV:  11/02/20, f/u Next OV:  none

## 2021-01-02 ENCOUNTER — Telehealth: Payer: Self-pay | Admitting: Family Medicine

## 2021-01-02 ENCOUNTER — Encounter: Payer: Self-pay | Admitting: Family Medicine

## 2021-01-02 NOTE — Telephone Encounter (Signed)
Lvm to call us to schedule a diabetes f/u

## 2021-01-02 NOTE — Telephone Encounter (Signed)
E-scribed refill.  Plz schedule OV for 4 mo diabetes f/u on 03/14/21 or later.

## 2021-01-03 NOTE — Telephone Encounter (Signed)
Noted  

## 2021-01-03 NOTE — Telephone Encounter (Signed)
Pt scheduled a diabetes f/u on 11.15.22

## 2021-02-05 ENCOUNTER — Encounter: Payer: Self-pay | Admitting: Family Medicine

## 2021-02-05 ENCOUNTER — Ambulatory Visit (INDEPENDENT_AMBULATORY_CARE_PROVIDER_SITE_OTHER): Payer: Self-pay | Admitting: Family Medicine

## 2021-02-05 ENCOUNTER — Other Ambulatory Visit: Payer: Self-pay

## 2021-02-05 VITALS — BP 126/82 | HR 70 | Temp 97.8°F | Ht 69.0 in | Wt 264.0 lb

## 2021-02-05 DIAGNOSIS — E1159 Type 2 diabetes mellitus with other circulatory complications: Secondary | ICD-10-CM

## 2021-02-05 DIAGNOSIS — E785 Hyperlipidemia, unspecified: Secondary | ICD-10-CM

## 2021-02-05 DIAGNOSIS — E1169 Type 2 diabetes mellitus with other specified complication: Secondary | ICD-10-CM

## 2021-02-05 DIAGNOSIS — I152 Hypertension secondary to endocrine disorders: Secondary | ICD-10-CM

## 2021-02-05 DIAGNOSIS — E1140 Type 2 diabetes mellitus with diabetic neuropathy, unspecified: Secondary | ICD-10-CM

## 2021-02-05 DIAGNOSIS — M5412 Radiculopathy, cervical region: Secondary | ICD-10-CM

## 2021-02-05 LAB — POCT GLYCOSYLATED HEMOGLOBIN (HGB A1C): Hemoglobin A1C: 7.8 % — AB (ref 4.0–5.6)

## 2021-02-05 MED ORDER — GABAPENTIN 300 MG PO CAPS: ORAL_CAPSULE | ORAL | 3 refills | Status: DC

## 2021-02-05 NOTE — Patient Instructions (Addendum)
Schedule eye exam when you can.  Continue current medicines - sugars are doing much better!  Increase gabapentin to 300mg  in the morning and 600mg  at bedtime, caution with sedation. Let me know if interested in neck xrays as next step. We could also consider physical therapy course for neck.  Return in 4-6 months for physical.

## 2021-02-05 NOTE — Assessment & Plan Note (Signed)
Ongoing discomfort - titrate gabapentin up to 300/600mg  daily, discussed neck xray and PT as next steps - he will let me know if interested.

## 2021-02-05 NOTE — Assessment & Plan Note (Signed)
Chronic, stable. Continue olmesartan.

## 2021-02-05 NOTE — Progress Notes (Signed)
Patient ID: Joel Bennett, male    DOB: 1965-03-31, 55 y.o.   MRN: [id number]  This visit was conducted in person.  BP 126/82   Pulse 70   Temp 97.8 F (36.6 C) (Temporal)   Ht 5\' 9"  (1.753 m)   Wt 264 lb (119.7 kg)   SpO2 97%   BMI 38.99 kg/m    CC: DM f/u visit  Subjective:   HPI: Joel Bennett is a 55 y.o. male presenting on 02/05/2021 for Diabetes (Here for f/u.)   Ongoing right neck/shoulder pain for months. Describes soreness as well as nerve pain associated with tingling/numbness. Increase in gabapentin to twice daily hasn't really helped.   DM - does not regularly check sugars - last checked Saturday 103. Compliant with antihyperglycemic regimen which includes: amaryl 2mg  daily, actos 30mg  daily. Intolerant to metformin XR and ozempic, unable to afford rybelsus. Denies low sugars or hypoglycemic symptoms. Denies paresthesias, blurry vision. Started on gabapentin 300mg  nightly for neuropathy with benefit to feet. Last diabetic eye exam 2020. Glucometer brand: Freestyle Lite. Last foot exam: 10/2020. DSME: states completed. No smoking history. Due for pneumonia vaccine.  Lab Results  Component Value Date   HGBA1C 7.8 (A) 02/05/2021   Diabetic Foot Exam - Simple   No data filed    Lab Results  Component Value Date   MICROALBUR 0.8 02/20/2014     H/o seronegative rheumatoid arthritis last saw rheumatology Marella Chimes) 04/2019 - was on MTX weekly. H/o PMR 09/2017. Has not returned to rheum. Hand swelling/redness and pain has largely resolved.      Relevant past medical, surgical, family and social history reviewed and updated as indicated. Interim medical history since our last visit reviewed. Allergies and medications reviewed and updated. Outpatient Medications Prior to Visit  Medication Sig Dispense Refill   aspirin EC 81 MG tablet Take 1 tablet (81 mg total) by mouth daily.     atorvastatin (LIPITOR) 40 MG tablet Take 1 tablet (40 mg total) by mouth daily. 90  tablet 3   glimepiride (AMARYL) 2 MG tablet Take 1 tablet (2 mg total) by mouth daily before breakfast. 90 tablet 1   glucose blood (FREESTYLE LITE) test strip Use as instructed to check sugar 1-2 times daily as needed. Dx: E11.65 100 each 3   indomethacin (INDOCIN) 50 MG capsule Take 50 mg by mouth 3 (three) times daily as needed.     olmesartan (BENICAR) 40 MG tablet TAKE 1 TABLET BY MOUTH EVERY DAY 90 tablet 0   pioglitazone (ACTOS) 30 MG tablet Take 1 tablet (30 mg total) by mouth daily. 90 tablet 1   vitamin B-12 (CYANOCOBALAMIN) 1000 MCG tablet Take 1 tablet (1,000 mcg total) by mouth daily.     gabapentin (NEURONTIN) 300 MG capsule Take 1 capsule (300 mg total) by mouth 2 (two) times daily. 60 capsule 3   No facility-administered medications prior to visit.     Per HPI unless specifically indicated in ROS section below Review of Systems  Objective:  BP 126/82   Pulse 70   Temp 97.8 F (36.6 C) (Temporal)   Ht 5\' 9"  (1.753 m)   Wt 264 lb (119.7 kg)   SpO2 97%   BMI 38.99 kg/m   Wt Readings from Last 3 Encounters:  02/05/21 264 lb (119.7 kg)  11/02/20 253 lb 8 oz (115 kg)  05/09/20 260 lb (117.9 kg)      Physical Exam Vitals and nursing note reviewed.  Constitutional:  Appearance: Normal appearance. He is not ill-appearing.  Eyes:     Extraocular Movements: Extraocular movements intact.     Conjunctiva/sclera: Conjunctivae normal.     Pupils: Pupils are equal, round, and reactive to light.  Neck:     Comments: No midline cervical tenderness or paracervical mm tenderness FROM at neck Cardiovascular:     Rate and Rhythm: Normal rate and regular rhythm.     Pulses: Normal pulses.     Heart sounds: Normal heart sounds. No murmur heard. Pulmonary:     Effort: Pulmonary effort is normal. No respiratory distress.     Breath sounds: Normal breath sounds. No wheezing, rhonchi or rales.  Musculoskeletal:        General: Normal range of motion.     Cervical back:  Normal range of motion and neck supple. No rigidity or tenderness.     Right lower leg: No edema.     Left lower leg: No edema.     Comments: See HPI for foot exam if done  Skin:    General: Skin is warm and dry.     Findings: No rash.  Neurological:     Mental Status: He is alert.     Comments:  5/5 strength BUE Grip strength intact Sensation intact to upper extremities  Psychiatric:        Mood and Affect: Mood normal.        Behavior: Behavior normal.      Results for orders placed or performed in visit on 02/05/21  POCT glycosylated hemoglobin (Hb A1C)  Result Value Ref Range   Hemoglobin A1C 7.8 (A) 4.0 - 5.6 %   HbA1c POC (<> result, manual entry)     HbA1c, POC (prediabetic range)     HbA1c, POC (controlled diabetic range)      Assessment & Plan:  This visit occurred during the SARS-CoV-2 public health emergency.  Safety protocols were in place, including screening questions prior to the visit, additional usage of staff PPE, and extensive cleaning of exam room while observing appropriate contact time as indicated for disinfecting solutions.   Problem List Items Addressed This Visit     Hypertension associated with diabetes ([place])    Chronic, stable. Continue olmesartan.       Type 2 diabetes mellitus with diabetic neuropathy, unspecified (HCC) - Primary    Chronic, improved on current regimen - continue.  Has not tolerated ozempic or metformin XR.       Relevant Orders   POCT glycosylated hemoglobin (Hb A1C) (Completed)   Dyslipidemia associated with type 2 diabetes mellitus (HCC)    Continue atorvastatin 40mg  daily.       Radiculitis of right cervical region    Ongoing discomfort - titrate gabapentin up to 300/600mg  daily, discussed neck xray and PT as next steps - he will let me know if interested.       Relevant Medications   gabapentin (NEURONTIN) 300 MG capsule     Meds ordered this encounter  Medications   gabapentin (NEURONTIN) 300 MG capsule     Sig: Take 1 capsule (300 mg total) by mouth daily AND 2 capsules (600 mg total) at bedtime.    Dispense:  90 capsule    Refill:  3    Note new sig    Orders Placed This Encounter  Procedures   POCT glycosylated hemoglobin (Hb A1C)     Patient Instructions  Schedule eye exam when you can.  Continue current medicines - sugars are  doing much better!  Increase gabapentin to 300mg  in the morning and 600mg  at bedtime, caution with sedation. Let me know if interested in neck xrays as next step. We could also consider physical therapy course for neck.  Return in 4-6 months for physical.   Follow up plan: Return in about 4 months (around 06/05/2021), or if symptoms worsen or fail to improve, for annual exam, prior fasting for blood work.  Ria Bush, MD

## 2021-02-05 NOTE — Assessment & Plan Note (Signed)
Chronic, improved on current regimen - continue.  Has not tolerated ozempic or metformin XR.

## 2021-02-05 NOTE — Assessment & Plan Note (Addendum)
Continue atorvastatin 40 mg daily.  

## 2021-03-22 ENCOUNTER — Encounter: Payer: Self-pay | Admitting: Family Medicine

## 2021-03-22 ENCOUNTER — Telehealth (INDEPENDENT_AMBULATORY_CARE_PROVIDER_SITE_OTHER): Payer: Self-pay | Admitting: Family Medicine

## 2021-03-22 DIAGNOSIS — J019 Acute sinusitis, unspecified: Secondary | ICD-10-CM

## 2021-03-22 MED ORDER — DOXYCYCLINE HYCLATE 100 MG PO TABS: 100.0000 mg | ORAL_TABLET | Freq: Two times a day (BID) | ORAL | 0 refills | Status: DC

## 2021-03-22 NOTE — Assessment & Plan Note (Addendum)
Discussed likely viral given short duration. Supportive measures reviewed including fluids, rest, mucinex, flonase, vitamins to boost immune system. If persistent past 7-10 days or worsening symptoms, WASP for doxycycline sent to pharmacy.

## 2021-03-22 NOTE — Progress Notes (Signed)
Patient ID: Joel Bennett, male    DOB: [birth year], 55 y.o.   MRN: [id number]  Virtual visit completed through [employer], a video enabled telemedicine application. Due to national recommendations of social distancing due to COVID-19, a virtual visit is felt to be most appropriate for this patient at this time. Reviewed limitations, risks, security and privacy concerns of performing a virtual visit and the availability of in person appointments. I also reviewed that there may be a patient responsible charge related to this service. The patient agreed to proceed.   Patient location: home Provider location: [place] at [facility], office Persons participating in this virtual visit: patient, provider   If any vitals were documented, they were collected by patient at home unless specified below.    Temp (!) 97.5 F (36.4 C)    Ht 5\' 9"  (1.753 m)    Wt 265 lb (120.2 kg)    BMI 39.13 kg/m    CC: sinus symptoms Subjective:   HPI: Joel Bennett is a 55 y.o. male presenting on 03/22/2021 for Nasal Congestion (Tried Nqquil, Mucinex and generic OTC sinus med. ) and Headache (Started 03/18/21.)   5d ho rhinorrhea, nasal congestion, sinus pressure HA, PNDrainage with phlegm production, cough productive of mild mucous. Some L earache and muffled hearing. Some GI upset with vomiting and diarrhea initially, now just nauseated.  Symptoms worse at night time No fevers/chills, tooth pain, dyspnea or wheezing. Normal sense of smell.   Mucinex, nyquil, OTC sinus headache medication with limited benefit.   Had negative home COVID test on Tuesday.  No h/o asthma Non smoker     Relevant past medical, surgical, family and social history reviewed and updated as indicated. Interim medical history since our last visit reviewed. Allergies and medications reviewed and updated. Outpatient Medications Prior to Visit  Medication Sig Dispense Refill   aspirin EC 81 MG tablet Take 1 tablet (81 mg total) by  mouth daily.     atorvastatin (LIPITOR) 40 MG tablet Take 1 tablet (40 mg total) by mouth daily. 90 tablet 3   gabapentin (NEURONTIN) 300 MG capsule Take 1 capsule (300 mg total) by mouth daily AND 2 capsules (600 mg total) at bedtime. 90 capsule 3   glimepiride (AMARYL) 2 MG tablet Take 1 tablet (2 mg total) by mouth daily before breakfast. 90 tablet 1   indomethacin (INDOCIN) 50 MG capsule Take 50 mg by mouth 3 (three) times daily as needed.     olmesartan (BENICAR) 40 MG tablet TAKE 1 TABLET BY MOUTH EVERY DAY 90 tablet 0   pioglitazone (ACTOS) 30 MG tablet Take 1 tablet (30 mg total) by mouth daily. 90 tablet 1   vitamin B-12 (CYANOCOBALAMIN) 1000 MCG tablet Take 1 tablet (1,000 mcg total) by mouth daily.     glucose blood (FREESTYLE LITE) test strip Use as instructed to check sugar 1-2 times daily as needed. Dx: E11.65 100 each 3   No facility-administered medications prior to visit.     Per HPI unless specifically indicated in ROS section below Review of Systems Objective:  Temp (!) 97.5 F (36.4 C)    Ht 5\' 9"  (1.753 m)    Wt 265 lb (120.2 kg)    BMI 39.13 kg/m   Wt Readings from Last 3 Encounters:  03/22/21 265 lb (120.2 kg)  02/05/21 264 lb (119.7 kg)  11/02/20 253 lb 8 oz (115 kg)       Physical exam: Gen: alert, NAD, not ill appearing Pulm: speaks  in complete sentences without increased work of breathing Psych: normal mood, normal thought content      Results for orders placed or performed in visit on 02/05/21  POCT glycosylated hemoglobin (Hb A1C)  Result Value Ref Range   Hemoglobin A1C 7.8 (A) 4.0 - 5.6 %   HbA1c POC (<> result, manual entry)     HbA1c, POC (prediabetic range)     HbA1c, POC (controlled diabetic range)     Assessment & Plan:   Problem List Items Addressed This Visit     Acute sinusitis    Discussed likely viral given short duration. Supportive measures reviewed including fluids, rest, mucinex, flonase, vitamins to boost immune system. If  persistent past 7-10 days or worsening symptoms, WASP for doxycycline sent to pharmacy.       Relevant Medications   doxycycline (VIBRA-TABS) 100 MG tablet     Meds ordered this encounter  Medications   doxycycline (VIBRA-TABS) 100 MG tablet    Sig: Take 1 tablet (100 mg total) by mouth 2 (two) times daily. Caution with too much sun exposure    Dispense:  20 tablet    Refill:  0   No orders of the defined types were placed in this encounter.   I discussed the assessment and treatment plan with the patient. The patient was provided an opportunity to ask questions and all were answered. The patient agreed with the plan and demonstrated an understanding of the instructions. The patient was advised to call back or seek an in-person evaluation if the symptoms worsen or if the condition fails to improve as anticipated.  Follow up plan: No follow-ups on file.  Ria Bush, MD

## 2021-03-23 ENCOUNTER — Other Ambulatory Visit: Payer: Self-pay | Admitting: Family Medicine

## 2021-03-26 ENCOUNTER — Telehealth: Payer: Self-pay | Admitting: Family Medicine

## 2021-03-27 ENCOUNTER — Telehealth: Payer: Self-pay | Admitting: Family Medicine

## 2021-04-04 ENCOUNTER — Ambulatory Visit: Payer: BC Managed Care – PPO | Admitting: Family Medicine

## 2021-04-07 NOTE — Progress Notes (Signed)
Joel Cyphers T. Edon Hoadley, MD, [place] at [facility] [place], [address]  Phone: [phone]   FAX: [phone]  Joel Bennett - 56 y.o. male   MRN [id number]   Date of Birth: 1965-12-02  Date: 04/08/2021   PCP: Ria Bush, MD   Referral: Ria Bush, MD  Chief Complaint  Patient presents with   Foot Pain    Left x several months/no injury    This visit occurred during the SARS-CoV-2 public health emergency.  Safety protocols were in place, including screening questions prior to the visit, additional usage of staff PPE, and extensive cleaning of exam room while observing appropriate contact time as indicated for disinfecting solutions.   Subjective:   Joel Bennett is a 56 y.o. very pleasant male patient with Body mass index is 39.74 kg/m. who presents with the following:  He presents for evaluation of acute foot pain.  I did see him in the past several years ago for polymyalgia rheumatica, he also does have a history of gout.  The patient presents with a 3 long history of heel pain. This is notable for worsening pain first thing in the morning when arising and standing after sitting.   Prior foot or ankle fractures: none Prior operations: none Orthotics or bracing: Orthotics from good feet, and additional heel cups.  None of this is helped. Medications: none PT or home rehab: none Night splints: no Ice massage: no Ball massage: no  Has some insoles.   Different shoes No real stretching.   On feet all day long.  Body mass index is 39.74 kg/m.  As a risk factor.   Review of Systems is noted in the HPI, as appropriate  Objective:   BP 120/76    Pulse 78    Temp 98.2 F (36.8 C) (Temporal)    Ht 5\' 9"  (1.753 m)    Wt 269 lb 2 oz (122.1 kg)    SpO2 99%    BMI 39.74 kg/m   GEN: No acute distress; alert,appropriate. PULM: Breathing comfortably in no respiratory distress PSYCH: Normally  interactive.    Foot exam: L Echymosis: no Edema: no ROM: full LE B Gait: Mildly antalgic MT pain: no Callus pattern: none Lateral Mall: NT Medial Mall: NT Talus: NT Navicular: NT Calcaneous: NT Metatarsals: NT 5th MT: NT Phalanges: NT Achilles: NT Plantar Fascia: tender, medial along PF. Pain with forced dorsi Fat Pad: NT Peroneals: NT Post Tib: NT Great Toe: Nml motion Ant Drawer: neg Other foot breakdown: none Long arch: preserved Transverse arch: preserved Hindfoot breakdown: none Sensation: intact   Laboratory and Imaging Data:  Assessment and Plan:     ICD-10-CM   1. Plantar fasciitis, left  M72.2      We reviewed that stretching is critically important to the treatment of PF.  Reviewed footwear. Rigid soles have been shown to help with PF. Night splints can sometimes help. Reviewed rehab of stretching and calf raises.   Could benefit from a corticosteroid injection, orthotics, or other measures if conservative treatment fails.   Social: Pain from plantar fasciitis is limiting his functional walking  Patient Instructions  Please read handouts on Plantar Fascitis.  STRETCHING and Strengthening program critically important.  Strengthening on foot and calf muscles as seen in handout. Calf raises, 2 legged, then 1 legged. Foot massage with tennis ball. Ice massage.  Towel Scrunches: get a towel or hand towel, use toes to pick up and scrunch  up the towel.  Marble pick-ups, practice picking up marbles with toes and placing into a cup  NEEDS TO BE DONE EVERY DAY  Recommended over the counter insoles. (Spenco or Hapad)  A rigid shoe with good arch support helps: Dansko (great), Jennet Maduro, Merrell No easily bendable shoes.   Tuli's heel cups    Follow-up: No follow-ups on file.  Dr[employer] One speech-to-text software was used for transcription in this dictation.  Possible transcriptional errors can occur using Editor, commissioning.    Signed,  Maud Deed. Aleka Twitty, MD   Outpatient Encounter Medications as of 04/08/2021  Medication Sig   atorvastatin (LIPITOR) 40 MG tablet Take 1 tablet (40 mg total) by mouth daily.   aspirin EC 81 MG tablet Take 1 tablet (81 mg total) by mouth daily.   gabapentin (NEURONTIN) 300 MG capsule TAKE ONE CAPSULE BY MOUTH TWICE A DAY   glimepiride (AMARYL) 2 MG tablet Take 1 tablet (2 mg total) by mouth daily before breakfast.   indomethacin (INDOCIN) 50 MG capsule Take 50 mg by mouth 3 (three) times daily as needed.   olmesartan (BENICAR) 40 MG tablet TAKE 1 TABLET BY MOUTH EVERY DAY   pioglitazone (ACTOS) 30 MG tablet Take 1 tablet (30 mg total) by mouth daily.   vitamin B-12 (CYANOCOBALAMIN) 1000 MCG tablet Take 1 tablet (1,000 mcg total) by mouth daily.   [DISCONTINUED] doxycycline (VIBRA-TABS) 100 MG tablet Take 1 tablet (100 mg total) by mouth 2 (two) times daily. Caution with too much sun exposure   [DISCONTINUED] glucose blood (FREESTYLE LITE) test strip Use as instructed to check sugar 1-2 times daily as needed. Dx: E11.65   No facility-administered encounter medications on file as of 04/08/2021.

## 2021-04-08 ENCOUNTER — Other Ambulatory Visit: Payer: Self-pay

## 2021-04-08 ENCOUNTER — Ambulatory Visit (INDEPENDENT_AMBULATORY_CARE_PROVIDER_SITE_OTHER): Payer: BC Managed Care – PPO | Admitting: Family Medicine

## 2021-04-08 ENCOUNTER — Encounter: Payer: Self-pay | Admitting: Family Medicine

## 2021-04-08 VITALS — BP 120/76 | HR 78 | Temp 98.2°F | Ht 69.0 in | Wt 269.1 lb

## 2021-04-08 DIAGNOSIS — M722 Plantar fascial fibromatosis: Secondary | ICD-10-CM | POA: Diagnosis not present

## 2021-04-08 NOTE — Patient Instructions (Signed)

## 2021-05-04 ENCOUNTER — Encounter: Payer: Self-pay | Admitting: Family Medicine

## 2021-05-06 ENCOUNTER — Other Ambulatory Visit: Payer: Self-pay

## 2021-05-06 ENCOUNTER — Emergency Department: Payer: BC Managed Care – PPO

## 2021-05-06 ENCOUNTER — Emergency Department
Admission: EM | Admit: 2021-05-06 | Discharge: 2021-05-06 | Disposition: A | Discharge status: Home / Self Care | Payer: BC Managed Care – PPO | Attending: Emergency Medicine | Admitting: Emergency Medicine

## 2021-05-06 DIAGNOSIS — R531 Weakness: Secondary | ICD-10-CM

## 2021-05-06 DIAGNOSIS — E119 Type 2 diabetes mellitus without complications: Secondary | ICD-10-CM | POA: Insufficient documentation

## 2021-05-06 DIAGNOSIS — U071 COVID-19: Secondary | ICD-10-CM | POA: Diagnosis not present

## 2021-05-06 DIAGNOSIS — I10 Essential (primary) hypertension: Secondary | ICD-10-CM | POA: Diagnosis not present

## 2021-05-06 DIAGNOSIS — R5383 Other fatigue: Secondary | ICD-10-CM | POA: Diagnosis not present

## 2021-05-06 DIAGNOSIS — R42 Dizziness and giddiness: Secondary | ICD-10-CM | POA: Diagnosis not present

## 2021-05-06 LAB — BASIC METABOLIC PANEL
Anion gap: 8 (ref 5–15)
BUN: 11 mg/dL (ref 6–20)
CO2: 24 mmol/L (ref 22–32)
Calcium: 8.8 mg/dL — ABNORMAL LOW (ref 8.9–10.3)
Chloride: 103 mmol/L (ref 98–111)
Creatinine, Ser: 0.73 mg/dL (ref 0.61–1.24)
GFR, Estimated: >60 mL/min (ref >60)
Glucose, Bld: 99 mg/dL (ref 70–99)
Potassium: 4 mmol/L (ref 3.5–5.1)
Sodium: 135 mmol/L (ref 135–145)

## 2021-05-06 LAB — CBC
HCT: 45.3 % (ref 39.0–52.0)
Hemoglobin: 14.9 g/dL (ref 13.0–17.0)
MCH: 32.4 pg (ref 26.0–34.0)
MCHC: 32.9 g/dL (ref 30.0–36.0)
MCV: 98.5 fL (ref 80.0–100.0)
Platelets: 174 10*3/uL (ref 150–400)
RBC: 4.6 MIL/uL (ref 4.22–5.81)
RDW: 13.7 % (ref 11.5–15.5)
WBC: 7.4 10*3/uL (ref 4.0–10.5)
nRBC: 0 % (ref 0.0–0.2)

## 2021-05-06 LAB — URINALYSIS, ROUTINE W REFLEX MICROSCOPIC
Bilirubin Urine: NEGATIVE
Glucose, UA: NEGATIVE mg/dL
Hgb urine dipstick: NEGATIVE
Ketones, ur: NEGATIVE mg/dL
Leukocytes,Ua: NEGATIVE
Nitrite: NEGATIVE
Protein, ur: NEGATIVE mg/dL
Specific Gravity, Urine: 1.01 (ref 1.005–1.030)
pH: 5 (ref 5.0–8.0)

## 2021-05-06 MED ORDER — AMLODIPINE BESYLATE 5 MG PO TABS
5.0000 mg | ORAL_TABLET | Freq: Once | ORAL | Status: AC
  Administered 2021-05-06: 5 mg via ORAL
  Filled 2021-05-06: qty 1

## 2021-05-06 MED ORDER — AMLODIPINE BESYLATE 5 MG PO TABS: 5.0000 mg | ORAL_TABLET | Freq: Every day | ORAL | 1 refills | Status: DC

## 2021-05-06 NOTE — ED Notes (Signed)
Pt ambulated from [place] to [facility].

## 2021-05-06 NOTE — Discharge Instructions (Signed)
Your lab tests and CT scan of the head were okay today. Please continue taking all of your medications and add on Amlodipine 5mg  once a day as prescribed.

## 2021-05-06 NOTE — ED Provider Notes (Signed)
[facility] Provider Note    Event Date/Time   First MD Initiated Contact with Patient 05/06/21 1553     (approximate)   History   Weakness   HPI  Joel Bennett is a 56 y.o. male with history of diabetes, hypertension who comes ED complaining of fatigue for the past 3 days, gradual onset, constant, no aggravating or alleviating factors.  No focal pain.  He does note a pressure sensation behind his eyes at times.  No acute vision deficits, paresthesias, motor weakness or change in balance or coordination.  No falls, no head trauma.  No fever chills or neck pain.  No vomiting or diarrhea. Notes that his blood pressure has been uncontrolled for the past 2 weeks.  I reviewed electronic medical records, noting his primary care Dr. Danise Mina has him on Benicar 40 mg once a day for blood pressure control.      Physical Exam   Triage Vital Signs: ED Triage Vitals  Enc Vitals Group     BP 05/06/21 1134 (!) 165/97     Pulse Rate 05/06/21 1134 88     Resp 05/06/21 1134 18     Temp 05/06/21 1134 98 F (36.7 C)     Temp src --      SpO2 05/06/21 1134 95 %     Weight --      Height --      Head Circumference --      Peak Flow --      Pain Score 05/06/21 1118 0     Pain Loc --      Pain Edu? --      Excl. in [place]? --     Most recent vital signs: Vitals:   05/06/21 1134  BP: (!) 165/97  Pulse: 88  Resp: 18  Temp: 98 F (36.7 C)  SpO2: 95%     General: Awake, no distress.  CV:  Good peripheral perfusion.  Regular rate and rhythm.  Symmetric pulses Resp:  Normal effort.  Clear to auscultation bilaterally Abd:  No distention.  Soft and nontender Other:  No lower extremity edema.  Moist oral mucosa.  Pupils equal round reactive to light.  Extraocular movements intact.  Cranial nerves III through XII intact.  Neuro intact.   ED Results / Procedures / Treatments   Labs (all labs ordered are listed, but only abnormal results are displayed) Labs  Reviewed  BASIC METABOLIC PANEL - Abnormal; Notable for the following components:      Result Value   Calcium 8.8 (*)    All other components within normal limits  URINALYSIS, ROUTINE W REFLEX MICROSCOPIC - Abnormal; Notable for the following components:   Color, Urine YELLOW (*)    APPearance CLEAR (*)    All other components within normal limits  CBC  CBG MONITORING, ED     EKG  Interpreted by me Normal sinus rhythm rate of 83.  Left axis, normal intervals.  Poor R wave progression.  Normal ST segments and T waves.  No acute ischemic changes.   RADIOLOGY CT head viewed and interpreted by me, negative for intracranial hemorrhage.  Radiology report reviewed.    PROCEDURES:  Critical Care performed: No  Procedures   MEDICATIONS ORDERED IN ED: Medications - No data to display   IMPRESSION / MDM / [place] / ED COURSE  I reviewed the triage vital signs and the nursing notes.  Differential diagnosis includes, but is not limited to, intracranial hemorrhage, symptomatic hypertension, renal insufficiency, electrolyte abnormality, anemia, UTI    Patient presents with fatigue, no other focal symptoms.  Blood pressure has been severely uncontrolled, as high as 180/100 at home.  Patient has been compliant with his medication.  Labs and CT scan of the head here are unremarkable.  He would benefit from adding on additional blood pressure control with amlodipine 5 mg daily, follow-up with primary care in a week for reassessment.  Return precautions discussed.  Does not require admission due to negative CT scan and lack of focal neurodeficits or other acute pain syndrome.     FINAL CLINICAL IMPRESSION(S) / ED DIAGNOSES   Final diagnoses:  Generalized weakness  Hypertension, unspecified type     Rx / DC Orders   ED Discharge Orders          Ordered    amLODipine (NORVASC) 5 MG tablet  Daily        05/06/21 1721              Note:  This document was prepared using Dragon voice recognition software and may include unintentional dictation errors.   Carrie Mew, MD 05/06/21 1735

## 2021-05-06 NOTE — ED Triage Notes (Signed)
Pt comes with c/o weakness and increase fatigue. Pt states this started few days ago. Pt denies any CP. Pt states some dizziness.

## 2021-05-08 ENCOUNTER — Telehealth: Payer: Self-pay

## 2021-05-08 ENCOUNTER — Emergency Department (HOSPITAL_COMMUNITY): Payer: BC Managed Care – PPO

## 2021-05-08 ENCOUNTER — Other Ambulatory Visit: Payer: Self-pay

## 2021-05-08 ENCOUNTER — Emergency Department (HOSPITAL_COMMUNITY)
Admission: EM | Admit: 2021-05-08 | Discharge: 2021-05-09 | Disposition: A | Discharge status: Home / Self Care | Payer: BC Managed Care – PPO | Attending: Emergency Medicine | Admitting: Emergency Medicine

## 2021-05-08 DIAGNOSIS — R42 Dizziness and giddiness: Secondary | ICD-10-CM | POA: Diagnosis not present

## 2021-05-08 DIAGNOSIS — I159 Secondary hypertension, unspecified: Secondary | ICD-10-CM | POA: Diagnosis not present

## 2021-05-08 DIAGNOSIS — Z79899 Other long term (current) drug therapy: Secondary | ICD-10-CM | POA: Insufficient documentation

## 2021-05-08 DIAGNOSIS — R202 Paresthesia of skin: Secondary | ICD-10-CM

## 2021-05-08 DIAGNOSIS — H538 Other visual disturbances: Secondary | ICD-10-CM | POA: Diagnosis not present

## 2021-05-08 DIAGNOSIS — Z7982 Long term (current) use of aspirin: Secondary | ICD-10-CM | POA: Insufficient documentation

## 2021-05-08 DIAGNOSIS — I10 Essential (primary) hypertension: Secondary | ICD-10-CM | POA: Diagnosis not present

## 2021-05-08 DIAGNOSIS — J341 Cyst and mucocele of nose and nasal sinus: Secondary | ICD-10-CM | POA: Diagnosis not present

## 2021-05-08 DIAGNOSIS — E119 Type 2 diabetes mellitus without complications: Secondary | ICD-10-CM | POA: Diagnosis not present

## 2021-05-08 DIAGNOSIS — R519 Headache, unspecified: Secondary | ICD-10-CM | POA: Diagnosis not present

## 2021-05-08 DIAGNOSIS — R0602 Shortness of breath: Secondary | ICD-10-CM | POA: Diagnosis not present

## 2021-05-08 DIAGNOSIS — I2510 Atherosclerotic heart disease of native coronary artery without angina pectoris: Secondary | ICD-10-CM | POA: Diagnosis not present

## 2021-05-08 LAB — BASIC METABOLIC PANEL
Anion gap: 11 (ref 5–15)
BUN: 8 mg/dL (ref 6–20)
CO2: 23 mmol/L (ref 22–32)
Calcium: 9.2 mg/dL (ref 8.9–10.3)
Chloride: 104 mmol/L (ref 98–111)
Creatinine, Ser: 0.91 mg/dL (ref 0.61–1.24)
GFR, Estimated: >60 mL/min (ref >60)
Glucose, Bld: 137 mg/dL — ABNORMAL HIGH (ref 70–99)
Potassium: 3.9 mmol/L (ref 3.5–5.1)
Sodium: 138 mmol/L (ref 135–145)

## 2021-05-08 LAB — CBC WITH DIFFERENTIAL/PLATELET
Abs Immature Granulocytes: 0.01 10*3/uL (ref 0.00–0.07)
Basophils Absolute: 0 10*3/uL (ref 0.0–0.1)
Basophils Relative: 1 %
Eosinophils Absolute: 0.2 10*3/uL (ref 0.0–0.5)
Eosinophils Relative: 2 %
HCT: 46.1 % (ref 39.0–52.0)
Hemoglobin: 15 g/dL (ref 13.0–17.0)
Immature Granulocytes: 0 %
Lymphocytes Relative: 31 %
Lymphs Abs: 2.2 10*3/uL (ref 0.7–4.0)
MCH: 31.8 pg (ref 26.0–34.0)
MCHC: 32.5 g/dL (ref 30.0–36.0)
MCV: 97.7 fL (ref 80.0–100.0)
Monocytes Absolute: 0.7 10*3/uL (ref 0.1–1.0)
Monocytes Relative: 9 %
Neutro Abs: 4 10*3/uL (ref 1.7–7.7)
Neutrophils Relative %: 57 %
Platelets: 185 10*3/uL (ref 150–400)
RBC: 4.72 MIL/uL (ref 4.22–5.81)
RDW: 13.7 % (ref 11.5–15.5)
WBC: 7.1 10*3/uL (ref 4.0–10.5)
nRBC: 0 % (ref 0.0–0.2)

## 2021-05-08 NOTE — ED Triage Notes (Signed)
C/o high blood pressure x 2 weeks; blurry vision since Saturday, numbness and tingling on lower extremities since yesterday.

## 2021-05-08 NOTE — ED Provider Triage Note (Addendum)
Emergency Medicine Provider Triage Evaluation Note  Joel Bennett , a 56 y.o. male  was evaluated in triage.  Pt complains of headache, blurred vision, generalized weakness.  Symptoms have been ongoing over the last 2 weeks however having bilateral blurred vision since Saturday.  He was seen at [facility] 2 days ago for similar complaints.  Had unremarkable work-up.  Had increase in his hypertension medication.  He states he continues to have symptoms.  He denies any diplopia, unilateral weakness.  Does feel generally weak.  No chest pain, shortness of breath, recent falls or injuries.  Occasionally feels some pressure behind both eyes.  Does state that today he has new tingling to bilateral face as well as some dizziness which began just over 24 hours ago but new since he was seen at [facility].  Review of Systems  Positive: Headache, blurred vision, dizziness, tingling Negative: Chest pain, shortness of breath  Physical Exam  BP (!) 153/97    Pulse 71    Temp 98.6 F (37 C) (Oral)    Resp 18    Ht 5\' 10"  (1.778 m)    Wt 122.5 kg    SpO2 96%    BMI 38.74 kg/m  Gen:   Awake, no distress   Resp:  Normal effort  MSK:   Moves extremities without difficulty  Neuro:  Cn 2-12 grossly intact, equal strength, intact sensation Other:    Medical Decision Making  Medically screening exam initiated at 5:07 PM.  Appropriate orders placed.  Loomis Anacker was informed that the remainder of the evaluation will be completed by another provider, this initial triage assessment does not replace that evaluation, and the importance of remaining in the ED until their evaluation is complete.  HA, dizziness, tingling, blurred vision    Patient does not meet code stroke, LVO criteria    Coady Train A, PA-C 05/08/21 1710

## 2021-05-08 NOTE — Telephone Encounter (Signed)
Noted patient currently in the emergency department

## 2021-05-08 NOTE — Telephone Encounter (Signed)
I spoke with pt who is at work in [place], [place]; pt was seen at [facility] ED on 05/06/21 with hypertension and fatigue. Pt was already taking Benicar 40 mg daily and amlodipine 5 mg taking daily was added at ED on 05/06/21.  Pt said has take Amlodipine and benicar once daily in AM since 05/06/21.pt said this wk BP 160's / 90's. This AM BP was 164/93  and pulse in 70's. Pt is at work now and cannot take his BP. Today pt has had tingling in face on both sides of face which continues now. Pt has H/A with pain level 6-7 and pressure in both eyes with blurred vision that these symptoms started on 05/04/21. Pt said lt hand feels weak now and that is new symptom. Pt said for few days has weakness in both legs but pt can grasp a cup and can walk OK. Pt said about 12 noon pt had dizziness that is gone now. Pt has hx of CAD, hypertension and DM. Pt said when seen in ED on 03/05/22 pt had imaging of head that was OK. Pt does not want to go to ED. I found a [facility] in [place] that is just down the street from where pt is working. Pt wants appt at [facility] instead. I spoke with Dr Lorelei Pont who is in office and he advised to tell pt he feels that he needs to be evaluated and some testing done today. I advised pt and he said he would go either to ED or UC after talking with his wife who has already scheduled pt a ED FU on 05/13/21 at 9:20 with Romilda Garret NP. Pt will keep that appt also but promised me he will be seen today at [facility] or ED.  Sending note to Dr Lorelei Pont, Romilda Garret NP and Lattie Haw CMA. Will send note to Dr Darnell Level as PCP as Juluis Rainier.

## 2021-05-09 ENCOUNTER — Emergency Department (HOSPITAL_COMMUNITY): Payer: BC Managed Care – PPO

## 2021-05-09 DIAGNOSIS — J341 Cyst and mucocele of nose and nasal sinus: Secondary | ICD-10-CM | POA: Diagnosis not present

## 2021-05-09 DIAGNOSIS — R42 Dizziness and giddiness: Secondary | ICD-10-CM | POA: Diagnosis not present

## 2021-05-09 MED ORDER — METOCLOPRAMIDE HCL 5 MG/ML IJ SOLN
10.0000 mg | Freq: Once | INTRAMUSCULAR | Status: AC
Start: 2021-05-09 — End: 2021-05-09
  Administered 2021-05-09: 10 mg via INTRAVENOUS
  Filled 2021-05-09: qty 2

## 2021-05-09 MED ORDER — KETOROLAC TROMETHAMINE 15 MG/ML IJ SOLN
15.0000 mg | Freq: Once | INTRAMUSCULAR | Status: AC
  Administered 2021-05-09: 15 mg via INTRAVENOUS
  Filled 2021-05-09: qty 1

## 2021-05-09 MED ORDER — IOHEXOL 350 MG/ML SOLN
50.0000 mL | Freq: Once | INTRAVENOUS | Status: AC | PRN
  Administered 2021-05-09: 50 mL via INTRAVENOUS

## 2021-05-09 MED ORDER — SODIUM CHLORIDE 0.9 % IV BOLUS
1000.0000 mL | Freq: Once | INTRAVENOUS | Status: AC
  Administered 2021-05-09: 1000 mL via INTRAVENOUS

## 2021-05-09 NOTE — ED Provider Notes (Signed)
[facility] EMERGENCY DEPARTMENT Provider Note   CSN: [id number] Arrival date & time: 05/08/21  1538     History  Chief Complaint  Patient presents with   Hypertension   Eye Problem    Joel Bennett is a 56 y.o. male.  HPI    56 year old male comes to the ER with chief complaint of elevated blood pressure, blurry vision, numbness and tingling over the lower extremity and face.  Patient has history of diabetes, hypertension, CAD.  He indicates that his BP has been running high for the last 2 weeks.  He checked his blood pressure because he was having generalized weakness and noted it to be high.  He was advised to go to ER at [facility], where he was checked out and discharged with PCP follow-up request.  Over the past 4 days he has experienced tingling sensation throughout his face and his lower extremities, along with intermittent episodes of headaches and he has also had some blurred vision binocularly.  He spoke with his PCP who advised that patient come to the ER.  Patient denies any focal weakness or numbness.  He denies any slurred speech, confusion, vertiginous symptoms/dizziness, unsteady gait or loss of vision.  Headaches are frontal, he does not have any history of headaches.  No history of DVT, PE.  Patient does not smoke or use any illicit drugs   Home Medications Prior to Admission medications   Medication Sig Start Date End Date Taking? Authorizing Provider  amLODipine (NORVASC) 5 MG tablet Take 1 tablet (5 mg total) by mouth daily. 05/06/21   Carrie Mew, MD  aspirin EC 81 MG tablet Take 1 tablet (81 mg total) by mouth daily. 02/04/19   Ria Bush, MD  atorvastatin (LIPITOR) 40 MG tablet Take 1 tablet (40 mg total) by mouth daily. 11/02/20   Ria Bush, MD  gabapentin (NEURONTIN) 300 MG capsule TAKE ONE CAPSULE BY MOUTH TWICE A DAY 03/25/21   Ria Bush, MD  glimepiride (AMARYL) 2 MG tablet Take 1 tablet (2 mg  total) by mouth daily before breakfast. 11/14/20   Ria Bush, MD  indomethacin (INDOCIN) 50 MG capsule Take 50 mg by mouth 3 (three) times daily as needed.    [provider]  olmesartan (BENICAR) 40 MG tablet TAKE 1 TABLET BY MOUTH EVERY DAY 01/02/21   Ria Bush, MD  pioglitazone (ACTOS) 30 MG tablet Take 1 tablet (30 mg total) by mouth daily. 12/17/20   Ria Bush, MD  vitamin B-12 (CYANOCOBALAMIN) 1000 MCG tablet Take 1 tablet (1,000 mcg total) by mouth daily. 11/03/20   Ria Bush, MD      Allergies    Glucophage xr [metformin hcl er] and Ozempic (0.25 or 0.5 mg-dose) [semaglutide(0.25 or 0.5mg -dos)]    Review of Systems   Review of Systems  All other systems reviewed and are negative.  Physical Exam Updated Vital Signs BP 131/87    Pulse 69    Temp 97.6 F (36.4 C) (Oral)    Resp 18    Ht 5\' 10"  (1.778 m)    Wt 122.5 kg    SpO2 97%    BMI 38.74 kg/m  Physical Exam Vitals and nursing note reviewed.  Constitutional:      Appearance: He is well-developed.  HENT:     Head: Atraumatic.  Eyes:     Extraocular Movements: Extraocular movements intact.     Pupils: Pupils are equal, round, and reactive to light.     Comments: No  nystagmus, bedside finger counting is normal  Cardiovascular:     Rate and Rhythm: Normal rate.  Pulmonary:     Effort: Pulmonary effort is normal.  Musculoskeletal:     Cervical back: Neck supple.  Skin:    General: Skin is warm.  Neurological:     Mental Status: He is alert and oriented to person, place, and time.     Cranial Nerves: No cranial nerve deficit.     Motor: No weakness.     Coordination: Coordination normal.     Comments: Subjective paresthesias to the entire face    ED Results / Procedures / Treatments   Labs (all labs ordered are listed, but only abnormal results are displayed) Labs Reviewed  BASIC METABOLIC PANEL - Abnormal; Notable for the following components:      Result Value   Glucose,  Bld 137 (*)    All other components within normal limits  CBC WITH DIFFERENTIAL/PLATELET    EKG EKG Interpretation  Date/Time:  Wednesday May 08 2021 17:09:07 EST Ventricular Rate:  69 PR Interval:  150 QRS Duration: 86 QT Interval:  398 QTC Calculation: 426 R Axis:   -55 Text Interpretation: Normal sinus rhythm Left axis deviation Possible Anterior infarct , age undetermined Abnormal ECG When compared with ECG of 06-May-2021 11:38, PREVIOUS ECG IS PRESENT No acute changes Confirmed by Varney Biles [phone]) on 05/09/2021 9:00:07 AM  Radiology CT ANGIO HEAD NECK W WO CM  Result Date: 05/09/2021 CLINICAL DATA:  Dizziness, persistent/recurrent, cardiac or vascular cause suspected EXAM: CT ANGIOGRAPHY HEAD AND NECK TECHNIQUE: Multidetector CT imaging of the head and neck was performed using the standard protocol during bolus administration of intravenous contrast. Multiplanar CT image reconstructions and MIPs were obtained to evaluate the vascular anatomy. Carotid stenosis measurements (when applicable) are obtained utilizing NASCET criteria, using the distal internal carotid diameter as the denominator. RADIATION DOSE REDUCTION: This exam was performed according to the departmental dose-optimization program which includes automated exposure control, adjustment of the mA and/or kV according to patient size and/or use of iterative reconstruction technique. CONTRAST:  66mL OMNIPAQUE IOHEXOL 350 MG/ML SOLN COMPARISON:  CT head February 15, 23. FINDINGS: CT HEAD FINDINGS Brain: No evidence of acute infarction, hemorrhage, hydrocephalus, extra-axial collection or mass lesion/mass effect. Vascular: See below. Skull: No acute fracture. Sinuses: Retention cyst in the right maxillary sinus with mild ethmoid air cell mucosal thickening. Otherwise, sinuses are clear. Orbits: No acute finding. Review of the MIP images confirms the above findings CTA NECK FINDINGS Aortic arch: Great vessel origins are  patent without significant stenosis. Right carotid system: No evidence of dissection, stenosis (50% or greater) or occlusion. Left carotid system: No evidence of dissection, stenosis (50% or greater) or occlusion. Vertebral arteries: Codominant. No evidence of dissection, stenosis (50% or greater) or occlusion. Streak artifact limits evaluation. Skeleton: Multilevel facet and uncovertebral arthropathy. Other neck: No evidence of acute abnormality on limited assessment. Upper chest: Visualized lung apices are clear on expiratory imaging. Review of the MIP images confirms the above findings CTA HEAD FINDINGS Anterior circulation: Bilateral intracranial ICAs, MCAs, and ACAs are patent without proximal hemodynamically significant stenosis. No aneurysm identified. Posterior circulation: Bilateral intradural vertebral arteries, basilar artery and posterior cerebral arteries are patent without proximal hemodynamically significant stenosis. Right posterior communicating artery is present with small right P1 PCA, anatomic variant. Bilateral picas are patent proximally. Venous sinuses: Poorly evaluated due to arterial timing without specific evidence of dural venous sinus thrombosis. Anatomic variants: See above. Review of  the MIP images confirms the above findings IMPRESSION: No large vessel occlusion or proximal hemodynamically significant stenosis in the head or neck. Electronically Signed   By: Margaretha Sheffield M.D.   On: 05/09/2021 10:52   DG Chest 2 View  Result Date: 05/08/2021 CLINICAL DATA:  Shortness of breath EXAM: CHEST - 2 VIEW COMPARISON:  05/09/2020 FINDINGS: The heart size and mediastinal contours are within normal limits. Both lungs are clear. The visualized skeletal structures are unremarkable. IMPRESSION: No active cardiopulmonary disease. Electronically Signed   By: Donavan Foil M.D.   On: 05/08/2021 18:59   CT HEAD WO CONTRAST (5MM)  Result Date: 05/08/2021 CLINICAL DATA:  Hypertension headache  EXAM: CT HEAD WITHOUT CONTRAST TECHNIQUE: Contiguous axial images were obtained from the base of the skull through the vertex without intravenous contrast. RADIATION DOSE REDUCTION: This exam was performed according to the departmental dose-optimization program which includes automated exposure control, adjustment of the mA and/or kV according to patient size and/or use of iterative reconstruction technique. COMPARISON:  CT brain 05/06/2013, 05/09/2020 FINDINGS: Brain: No definite acute territorial infarction, hemorrhage, or intracranial mass is visualized. The ventricles are nonenlarged. Hypodense artifact over the ventral pons on sagittal views, appearance similar as compared with previous CT examinations. Vascular: No hyperdense vessels.  No unexpected calcification Skull: Normal. Negative for fracture or focal lesion. Sinuses/Orbits: Moderate lobulated mucosal thickening or retention cyst in the right maxillary sinus. Other: None IMPRESSION: No definite CT evidence for acute intracranial abnormality. MRI follow-up may be pursued as clinically indicated. Electronically Signed   By: Donavan Foil M.D.   On: 05/08/2021 17:49    Procedures Procedures    Medications Ordered in ED Medications  sodium chloride 0.9 % bolus 1,000 mL (0 mLs Intravenous Stopped 05/09/21 1216)  ketorolac (TORADOL) 15 MG/ML injection 15 mg (15 mg Intravenous Given 05/09/21 0956)  metoCLOPramide (REGLAN) injection 10 mg (10 mg Intravenous Given 05/09/21 0957)  iohexol (OMNIPAQUE) 350 MG/ML injection 50 mL (50 mLs Intravenous Contrast Given 05/09/21 1042)    ED Course/ Medical Decision Making/ A&P                           Medical Decision Making Amount and/or Complexity of Data Reviewed Radiology: ordered.  Risk Prescription drug management.   This patient presents to the ED with chief complaint(s) of headaches, facial tingling, blurry vision binocularly, elevated blood pressure with pertinent past medical history of  hypertension, CAD, diabetes which further complicates the presenting complaint. The complaint involves an extensive differential diagnosis and treatment options and also carries with it a high risk of complications and morbidity.    Patient's constellation of symptoms are rather nonspecific. Binocular facial numbness, bilateral lower extremity numbness not consistent with stroke, binocular blurry vision not typically consistent with stroke.  Patient however having headache which is new for him.  He does not have any risk factors for thrombosis, denies any substance use disorder and the headaches are not classic thunderclap type headaches.  Clinical suspicion for subarachnoid hemorrhage or cerebral venous thrombosis is quite low.  I do not think an LP is required at this time.  The differential diagnosis includes: Complex headaches, complex migraine, brain aneurysm, cerebral thrombosis, neuropathy, hypertensive headaches, electrolyte imbalance  The initial plan is to order basic lab and to treat patient with IV Toradol, Reglan for headache.  Additional Tests and treatment considered: I considered MRI of the brain.   Patient is low risk for stroke based  on history and exam, I do not think an MRI brain emergently is indicated.  Additional history obtained: Additional history obtained from spouse  Imaging Studies: I ordered and independently visualized and interpreted the following imaging CT scan angiogram of the head and neck   which showed no evidence of brain bleed, no evidence of clinically significant thrombosis. The interpretation of the imaging was limited to assessing for emergent pathology, for which purpose it was ordered.    Reevaluation of the patient after these medicines showed that the patient    improved   Cardiac Monitoring: The patient was maintained on a cardiac monitor.  I personally viewed and interpreted the cardiac monitor which showed an underlying rhythm of:  sinus  rhythm  Complexity of problems addressed: Patients presentation is most consistent with  acute illness/injury with systemic symptoms During patient's assessment  Disposition: After consideration of the diagnostic results and the patients response to treatment,  I feel that the patent would benefit from discharge with recommendation of keeping a BP log and following up with neurologist in 1 week.  He will follow-up with his PCP for blood pressure management.  Patient will return to the ER if he starts having any new neurologic symptoms, excruciating headaches, neck pain. .    Final Clinical Impression(s) / ED Diagnoses Final diagnoses:  Bad headache  Facial paresthesia  Secondary hypertension  Blurry vision, bilateral    Rx / DC Orders ED Discharge Orders     None         Varney Biles, MD 05/09/21 1428

## 2021-05-09 NOTE — Discharge Instructions (Addendum)
We saw you in the ER for elevated blood pressure, headaches, facial numbness/tingling and vision changes. All the results in the ER are normal, labs and imaging.  CT angiogram did not reveal any evidence of aneurysm or blockages to your blood vessels.  There is no evidence of brain bleed.  No clear evidence on CT scan of any stroke  We are not sure what is causing your symptoms.  The workup in the ER is not complete, and is limited to screening for life threatening and emergent conditions only, so we do think you need to follow-up with neurologist for further evaluation and management of the headache and tingling.  We need you to see your primary care doctor for optimal blood pressure management.  Continue taking 81 mg aspirin every day.

## 2021-05-09 NOTE — ED Notes (Signed)
Pt ambulatory to restroom with steady gait.

## 2021-05-09 NOTE — ED Notes (Signed)
Pt reports he was sent to ED by PCP for HTN. States he has been having blurry vision and headaches.

## 2021-05-09 NOTE — ED Notes (Signed)
Pt transported to CT ?

## 2021-05-13 ENCOUNTER — Encounter: Payer: Self-pay | Admitting: Nurse Practitioner

## 2021-05-13 ENCOUNTER — Ambulatory Visit: Payer: BC Managed Care – PPO | Admitting: Nurse Practitioner

## 2021-05-13 ENCOUNTER — Other Ambulatory Visit: Payer: Self-pay

## 2021-05-13 VITALS — BP 132/78 | HR 72 | Temp 97.7°F | Resp 12 | Ht 70.0 in | Wt 269.4 lb

## 2021-05-13 DIAGNOSIS — Z760 Encounter for issue of repeat prescription: Secondary | ICD-10-CM

## 2021-05-13 DIAGNOSIS — R202 Paresthesia of skin: Secondary | ICD-10-CM | POA: Diagnosis not present

## 2021-05-13 MED ORDER — GLIMEPIRIDE 2 MG PO TABS: 2.0000 mg | ORAL_TABLET | Freq: Every day | ORAL | 0 refills | Status: DC

## 2021-05-13 MED ORDER — INDOMETHACIN 50 MG PO CAPS: 50.0000 mg | ORAL_CAPSULE | Freq: Three times a day (TID) | ORAL | 0 refills | Status: DC | PRN

## 2021-05-13 NOTE — Patient Instructions (Signed)
Nice to see you today Follow up with Dr. Darnell Level I will place a referral for neurology.  I refilled the medications that your requested

## 2021-05-13 NOTE — Assessment & Plan Note (Signed)
Was evaluated twice in the emergency department dealing with facial paresthesia and hypertension.  Patient was discharged both times.  States he does check his blood pressure at home but readings have been getting higher.  States his wife has a manual blood pressure cuff and check if needed.  Did encourage patient to check blood pressure electronically and manually and compare.  He can also bring his machine in to the office and we can compare our readings to his.  Continue taking blood pressure medications and all other medication as prescribed.  Refer to neurology for further evaluation.  Patient acknowledged and is in agreement with plan of care.

## 2021-05-13 NOTE — Progress Notes (Signed)
Established Patient Office Visit  Subjective:  Patient ID: Joel Bennett, male    DOB: November 21, 1965  Age: 56 y.o. MRN: [id number]  CC:  Chief Complaint  Patient presents with   ER follow up    Feels some better, still having some headaches and facial numbness.     HPI Joel Bennett presents for ED follow up   Was seen on 05/06/2021 at [facility] emergency department.  Related to be hypertensive did CT of head and chest x-ray which were both negative.  Did write him amlodipine 5 mg daily.  Patient had recurrence of symptoms on 05/08/2021 and went to [facility] emergency department where a CT angio neck and head were performed that came back without acute abnormalities.  He was subsequently discharged home and told to follow-up with PCP  Blood pressure this morning was 159/94 this was immediately after taking his antihypertensive. States that his blood pressure was good until approx 3 weeks ago. States no known change minus family drama. States that it is his daughter and her mother of time tracking into the issue.  But no involvement this week.  He has had the paresthesias for some period of time but states it got worse last week  States that it is intermittent for the past few years. States that it got worse last week and felt fatigued along with his Blood pressure being up. States that he was cutting a tree and his enduracnce has decreased in regards to what it has been in the past.  Incidental note the patient states has gained 20 pounds over the past 6 months with no change in activity or diet States he does have a cardiologist that he does not get along well with.  Continue PCP working out for the patient to see Regards to cardiology   Past Medical History:  Diagnosis Date   Allergic rhinitis    Allergy    COVID-19 virus infection 01/2020   s/p mAb infusion   DDD (degenerative disc disease), lumbar    Diabetes mellitus without complication (Joel Bennett)    Diabetes type 2, uncontrolled 2012    Gout    History of depression 2000   treated with meds   Hypertension 1994   Seronegative rheumatoid arthritis ([place]) 01/2019   MTX started 01/2019 - sees Joel Bennett)   Stool incontinence 12/2011   neg giardia, neg O&P, neg iFOB from prior PCP    Past Surgical History:  Procedure Laterality Date   [place]   Lumbar   CARDIOVASCULAR STRESS TEST  08/2015   WNL (Joel Joel Bennett)   [place] Left 05/2019   Joel Bennett   COLONOSCOPY  05/2014   TA several, rpt 3 yrs Joel Bennett)   COLONOSCOPY  11/2018   TA, rpt 7 yrs Joel Bennett)   [place] Left 2002   x 2   Thumb Surgery Left    Joint Fusion on left hand    Family History  Problem Relation Age of Onset   Cancer Maternal Aunt        lung (smoker)   Rectal cancer Maternal Aunt    Cancer Maternal Aunt        rectal   Cancer Mother        lung (smoker)   Cancer Paternal Grandmother        lung (smoker)   CAD Paternal Grandmother  MI   Cancer Other        paternal great uncle unsure type   Alcohol abuse Paternal Grandfather    Alcohol abuse Maternal Grandfather    Alcohol abuse Paternal Uncle        and drugs   CAD Paternal Uncle        MI   CAD Paternal Aunt 17       MI   Stroke Maternal Grandmother    Stroke Cousin    Diabetes Father    Atrial fibrillation Father    Diabetes Cousin    Colon polyps Neg Hx    Esophageal cancer Neg Hx    Stomach cancer Neg Hx     Social History   Socioeconomic History   Marital status: Married    Spouse name: Not on file   Number of children: Not on file   Years of education: Not on file   Highest education level: Not on file  Occupational History   Not on file  Tobacco Use   Smoking status: Never   Smokeless tobacco: Current    Types: Snuff  Vaping Use   Vaping Use: Never used  Substance and Sexual Activity   Alcohol use: Yes    Alcohol/week: 0.0 standard drinks    Comment:  occasional   Drug use: No   Sexual activity: Yes  Other Topics Concern   Not on file  Social History Narrative   Lives with wife   Grown children   Occ: Database administrator   Edu: HS   Activity: no regular exercise   Diet: good water, fruits/vegetables daily, red meat regularly   Social Determinants of Health   Financial Resource Strain: Not on file  Food Insecurity: Not on file  Transportation Needs: Not on file  Physical Activity: Not on file  Stress: Not on file  Social Connections: Not on file  Intimate Partner Violence: Not on file    Outpatient Medications Prior to Visit  Medication Sig Dispense Refill   amLODipine (NORVASC) 5 MG tablet Take 1 tablet (5 mg total) by mouth daily. 30 tablet 1   aspirin EC 81 MG tablet Take 1 tablet (81 mg total) by mouth daily.     atorvastatin (LIPITOR) 40 MG tablet Take 1 tablet (40 mg total) by mouth daily. 90 tablet 3   gabapentin (NEURONTIN) 300 MG capsule TAKE ONE CAPSULE BY MOUTH TWICE A DAY 60 capsule 3   glimepiride (AMARYL) 2 MG tablet Take 1 tablet (2 mg total) by mouth daily before breakfast. 90 tablet 1   indomethacin (INDOCIN) 50 MG capsule Take 50 mg by mouth 3 (three) times daily as needed.     olmesartan (BENICAR) 40 MG tablet TAKE 1 TABLET BY MOUTH EVERY DAY 90 tablet 0   pioglitazone (ACTOS) 30 MG tablet Take 1 tablet (30 mg total) by mouth daily. 90 tablet 1   vitamin B-12 (CYANOCOBALAMIN) 1000 MCG tablet Take 1 tablet (1,000 mcg total) by mouth daily.     No facility-administered medications prior to visit.    Allergies  Allergen Reactions   Glucophage Xr [Metformin Hcl Er] Diarrhea    Loose stools even with XR form   Ozempic (0.25 Or 0.5 Mg-Dose) [Semaglutide(0.25 Or 0.5mg -Dos)] Nausea Only    ROS Review of Systems  Constitutional:  Negative for chills and fever.  Eyes:  Negative for visual disturbance.  Respiratory:  Positive for shortness of breath (DOE and increasing).   Cardiovascular:  Positive for  chest pain (  intermittent random for years. different in nature) and leg swelling.  Gastrointestinal:  Negative for constipation, diarrhea, nausea and vomiting.       BM daily    Neurological:  Positive for dizziness (not currenlty), light-headedness (not currenlty), numbness (facial paresthesia to the whole face.) and headaches (not currently).     Objective:    Physical Exam Vitals and nursing note reviewed.  Constitutional:      Appearance: He is obese.  HENT:     Right Ear: Tympanic membrane, ear canal and external ear normal.     Left Ear: Tympanic membrane, ear canal and external ear normal.  Eyes:     Extraocular Movements: Extraocular movements intact.     Pupils: Pupils are equal, round, and reactive to light.  Cardiovascular:     Rate and Rhythm: Normal rate and regular rhythm.     Pulses: Normal pulses.     Heart sounds: Normal heart sounds.  Pulmonary:     Effort: Pulmonary effort is normal.     Breath sounds: Normal breath sounds.  Musculoskeletal:     Right lower leg: No edema.     Left lower leg: No edema.  Skin:    General: Skin is warm.  Neurological:     General: No focal deficit present.     Mental Status: He is alert.     Cranial Nerves: No cranial nerve deficit.     Sensory: No sensory deficit.     Motor: No weakness.     Coordination: Coordination normal.  Psychiatric:        Mood and Affect: Mood normal.        Behavior: Behavior normal.        Thought Content: Thought content normal.        Judgment: Judgment normal.    BP 132/78    Pulse 72    Temp 97.7 F (36.5 C)    Resp 12    Ht 5\' 10"  (1.778 m)    Wt 269 lb 7 oz (122.2 kg)    SpO2 98%    BMI 38.66 kg/m  Wt Readings from Last 3 Encounters:  05/13/21 269 lb 7 oz (122.2 kg)  05/08/21 270 lb (122.5 kg)  04/08/21 269 lb 2 oz (122.1 kg)     Health Maintenance Due  Topic Date Due   HIV Screening  Never done   Zoster Vaccines- Shingrix (1 of 2) Never done   TETANUS/TDAP  03/24/2017    OPHTHALMOLOGY EXAM  09/16/2019    There are no preventive care reminders to display for this patient.  Lab Results  Component Value Date   TSH 1.51 08/10/2018   Lab Results  Component Value Date   WBC 7.1 05/08/2021   HGB 15.0 05/08/2021   HCT 46.1 05/08/2021   MCV 97.7 05/08/2021   PLT 185 05/08/2021   Lab Results  Component Value Date   NA 138 05/08/2021   K 3.9 05/08/2021   CO2 23 05/08/2021   GLUCOSE 137 (H) 05/08/2021   BUN 8 05/08/2021   CREATININE 0.91 05/08/2021   BILITOT 0.6 11/02/2020   ALKPHOS 66 11/02/2020   AST 46 (H) 11/02/2020   ALT 51 11/02/2020   PROT 6.7 11/02/2020   ALBUMIN 4.1 11/02/2020   CALCIUM 9.2 05/08/2021   ANIONGAP 11 05/08/2021   GFR 98.06 11/02/2020   Lab Results  Component Value Date   CHOL 180 11/02/2020   Lab Results  Component Value Date   HDL 32.90 (L)  11/02/2020   Lab Results  Component Value Date   LDLCALC 94 10/28/2018   Lab Results  Component Value Date   TRIG 327.0 (H) 11/02/2020   Lab Results  Component Value Date   CHOLHDL 5 11/02/2020   Lab Results  Component Value Date   HGBA1C 7.8 (A) 02/05/2021      Assessment & Plan:   Problem List Items Addressed This Visit       Other   Facial paresthesia    Was evaluated twice in the emergency department dealing with facial paresthesia and hypertension.  Patient was discharged both times.  States he does check his blood pressure at home but readings have been getting higher.  States his wife has a manual blood pressure cuff and check if needed.  Did encourage patient to check blood pressure electronically and manually and compare.  He can also bring his machine in to the office and we can compare our readings to his.  Continue taking blood pressure medications and all other medication as prescribed.  Refer to neurology for further evaluation.  Patient acknowledged and is in agreement with plan of care.      Relevant Orders   Ambulatory referral to Neurology    Other Visit Diagnoses     Medication refill    -  Primary   Relevant Medications   glimepiride (AMARYL) 2 MG tablet   indomethacin (INDOCIN) 50 MG capsule       Meds ordered this encounter  Medications   glimepiride (AMARYL) 2 MG tablet    Sig: Take 1 tablet (2 mg total) by mouth daily before breakfast.    Dispense:  90 tablet    Refill:  0   indomethacin (INDOCIN) 50 MG capsule    Sig: Take 1 capsule (50 mg total) by mouth 3 (three) times daily as needed.    Dispense:  30 capsule    Refill:  0    Follow-up: Return for as p[lanned for 06/11/2021 with Joel. Danise Mina.  This visit occurred during the SARS-CoV-2 public health emergency.  Safety protocols were in place, including screening questions prior to the visit, additional usage of staff PPE, and extensive cleaning of exam room while observing appropriate contact time as indicated for disinfecting solutions.     Romilda Garret, NP

## 2021-06-04 NOTE — Progress Notes (Signed)
? ?[facility] ? ?PATIENT: Joel Bennett ?DOB: February 10, 1966 ? ?REFERRING DOCTOR OR PCP: Ria Bush, MD ?SOURCE: Patient, notes from primary care, imaging reports.  Images personally reviewed. ?_________________________________ ? ? ?HISTORICAL ? ?CHIEF COMPLAINT:  ?Chief Complaint  ?Patient presents with  ? New Patient (Initial Visit)  ?  Pt alone, rm 1. Pt has had tingling in face and right shoulder. This has been going on for couple years. It intermittently comes and goes   ? ? ?HISTORY OF PRESENT ILLNESS:  ?I had the pleasure of seeing your patient, Joel Bennett, at [facility] for neurologic consultation regarding his right shoulder and face numbness. ? ?He is a 56 year old man who has had intermittent right face and shoulder numbness over the ast 2 years..   The sensation in the face is tingling only and the sensation in the shoulder/deltoid region is numbness followed by tingling.   Most spells last a few minutes.   They occur once or twice a day.  He notes some neck pain but no association with the numbness.   He has some lower back and leg pain.   No bladder issues.   Gait and balance are fine.     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     He denies weakness in the arm.      Gabapentin had not helped.  He has not noted indomethacin helping (takes occasioanlly for gout).    Changes in position do not help wen an episode is occurring..    ? ?CT scan of the head and CT angiogram of the head and neck 05/09/2021 were personally reviewed.  They are normal.    He has not had an MRI.     ? ?He has gout.    He has NIDDM.      ? ?REVIEW OF SYSTEMS: ?Constitutional: No fevers, chills, sweats, or change in appetite ?Eyes: No visual changes, double vision, eye pain ?Ear, nose and throat: No hearing loss, ear pain, nasal congestion, sore throat ?Cardiovascular: No chest pain,  palpitations ?Respiratory:  No shortness of breath at rest or with exertion.   No wheezes ?GastrointestinaI: No nausea, vomiting, diarrhea, abdominal pain, fecal incontinence ?Genitourinary:  No dysuria, urinary retention or frequency.  No nocturia. ?Musculoskeletal: He reports neck pain.  He has gout.  He has been diagnosed with seronegative ?Integumentary: No rash, pruritus, skin lesions ?Neurological: as above ?Psychiatric: No depression at this time.  No anxiety ?Endocrine: No palpitations, diaphoresis, change in appetite, change in weigh or increased thirst ?Hematologic/Lymphatic:  No anemia, purpura, petechiae. ?Allergic/Immunologic: No itchy/runny eyes, nasal congestion, recent allergic reactions, rashes ? ?ALLERGIES: ?Allergies  ?Allergen Reactions  ? Glucophage Xr [Metformin Hcl Er] Diarrhea  ?  Loose stools even with XR form  ? Ozempic (0.25 Or 0.5 Mg-Dose) [Semaglutide(0.25 Or 0.'5mg'$ -Dos)] Nausea Only  ? ? ?HOME MEDICATIONS: ? ?Current Outpatient Medications:  ?  amLODipine (NORVASC) 5 MG tablet, Take 1 tablet (5 mg total) by mouth daily., Disp: 30 tablet, Rfl: 1 ?  aspirin EC 81 MG tablet, Take 1 tablet (81 mg total) by mouth daily., Disp:  , Rfl:  ?  atorvastatin (LIPITOR) 40 MG tablet, Take 1 tablet (40 mg total) by mouth daily., Disp: 90 tablet, Rfl: 3 ?  gabapentin (NEURONTIN) 300 MG capsule, TAKE ONE CAPSULE BY MOUTH TWICE A DAY, Disp: 60 capsule, Rfl: 3 ?  glimepiride (  AMARYL) 2 MG tablet, Take 1 tablet (2 mg total) by mouth daily before breakfast., Disp: 90 tablet, Rfl: 0 ?  indomethacin (INDOCIN) 50 MG capsule, Take 1 capsule (50 mg total) by mouth 3 (three) times daily as needed., Disp: 30 capsule, Rfl: 0 ?  olmesartan (BENICAR) 40 MG tablet, TAKE 1 TABLET BY MOUTH EVERY DAY, Disp: 90 tablet, Rfl: 0 ?  pioglitazone (ACTOS) 30 MG tablet, Take 1 tablet (30 mg total) by mouth daily., Disp: 90 tablet, Rfl: 1 ?  vitamin B-12 (CYANOCOBALAMIN) 1000 MCG tablet, Take 1 tablet (1,000 mcg total) by mouth  daily., Disp: , Rfl:  ? ?PAST MEDICAL HISTORY: ?Past Medical History:  ?Diagnosis Date  ? Allergic rhinitis   ? Allergy   ? COVID-19 virus infection 01/2020  ? s/p mAb infusion  ? DDD (degenerative disc disease), lumbar   ? Diabetes mellitus without complication ([place])   ? Diabetes type 2, uncontrolled 2012  ? Gout   ? History of depression 2000  ? treated with meds  ? Hypertension 1994  ? Seronegative rheumatoid arthritis ([place]) 01/2019  ? MTX started 01/2019 - sees [place] rheum Marella Chimes)  ? Stool incontinence 12/2011  ? neg giardia, neg O&P, neg iFOB from prior PCP  ? ? ?PAST SURGICAL HISTORY: ?Past Surgical History:  ?Procedure Laterality Date  ? ANKLE SURGERY Left 1999  ? BACK SURGERY  1997  ? Lumbar  ? CARDIOVASCULAR STRESS TEST  08/2015  ? WNL (Dr Oval Linsey)  ? CARPAL TUNNEL RELEASE Left 05/2019  ? Dr Peggye Ley  ? COLONOSCOPY  05/2014  ? TA several, rpt 3 yrs Ardis Hughs)  ? COLONOSCOPY  11/2018  ? TA, rpt 7 yrs Ardis Hughs)  ? KNEE SURGERY Right 1985  ? SHOULDER SURGERY Left 2002  ? x 2  ? Thumb Surgery Left   ? Joint Fusion on left hand  ? ? ?FAMILY HISTORY: ?Family History  ?Problem Relation Age of Onset  ? Cancer Maternal Aunt   ?     lung (smoker)  ? Rectal cancer Maternal Aunt   ? Cancer Maternal Aunt   ?     rectal  ? Cancer Mother   ?     lung (smoker)  ? Cancer Paternal Grandmother   ?     lung (smoker)  ? CAD Paternal Grandmother   ?     MI  ? Cancer Other   ?     paternal great uncle unsure type  ? Alcohol abuse Paternal Grandfather   ? Alcohol abuse Maternal Grandfather   ? Alcohol abuse Paternal Uncle   ?     and drugs  ? CAD Paternal Uncle   ?     MI  ? CAD Paternal Aunt 63  ?     MI  ? Stroke Maternal Grandmother   ? Stroke Cousin   ? Diabetes Father   ? Atrial fibrillation Father   ? Diabetes Cousin   ? Colon polyps Neg Hx   ? Esophageal cancer Neg Hx   ? Stomach cancer Neg Hx   ? ? ?SOCIAL HISTORY: ? ?Social History  ? ?Socioeconomic History  ? Marital status: Married  ?  Spouse name: Not on file  ? Number of  children: Not on file  ? Years of education: Not on file  ? Highest education level: Not on file  ?Occupational History  ? Not on file  ?Tobacco Use  ? Smoking status: Never  ? Smokeless tobacco: Current  ?  Types:  Snuff  ?Vaping Use  ? Vaping Use: Never used  ?Substance and Sexual Activity  ? Alcohol use: Yes  ?  Alcohol/week: 0.0 standard drinks  ?  Comment: occasional  ? Drug use: No  ? Sexual activity: Yes  ?Other Topics Concern  ? Not on file  ?Social History Narrative  ? Lives with wife  ? Grown children  ? Occ: Database administrator  ? Edu: HS  ? Activity: no regular exercise  ? Diet: good water, fruits/vegetables daily, red meat regularly  ? ?Social Determinants of Health  ? ?Financial Resource Strain: Not on file  ?Food Insecurity: Not on file  ?Transportation Needs: Not on file  ?Physical Activity: Not on file  ?Stress: Not on file  ?Social Connections: Not on file  ?Intimate Partner Violence: Not on file  ? ? ? ?PHYSICAL EXAM ? ?Vitals:  ? 06/05/21 1509  ?BP: (!) 157/95  ?Pulse: 73  ?Weight: 274 lb (124.3 kg)  ?Height: '5\' 9"'$  (1.753 m)  ? ? ?Body mass index is 40.46 kg/m?. ? ? ?General: The patient is well-developed and well-nourished and in no acute distress ? ?HEENT:  Head is [place]/AT.  Sclera are anicteric.    ? ?Neck: No carotid bruits are noted.  The neck is nontender.  There is a slightly reduced range of motion of the neck. ? ?Cardiovascular: The heart has a regular rate and rhythm with a normal S1 and S2. There were no murmurs, gallops or rubs.   ? ?Skin: Extremities are without rash or  edema. ? ?Musculoskeletal:  Back is nontender ? ?Neurologic Exam ? ?Mental status: The patient is alert and oriented x 3 at the time of the examination. The patient has apparent normal recent and remote memory, with an apparently normal attention span and concentration ability.   Speech is normal. ? ?Cranial nerves: Extraocular movements are full.  Facial strength and sensation was normal.  Trapezius and  sternocleidomastoid strength is normal. No dysarthria is noted.  The tongue is midline, and the patient has symmetric elevation of the soft palate. No obvious hearing deficits are noted. ? ?Motor:  Muscle bulk is normal

## 2021-06-05 ENCOUNTER — Ambulatory Visit: Payer: BC Managed Care – PPO | Admitting: Neurology

## 2021-06-05 ENCOUNTER — Encounter: Payer: Self-pay | Admitting: Neurology

## 2021-06-05 VITALS — BP 157/95 | HR 73 | Ht 69.0 in | Wt 274.0 lb

## 2021-06-05 DIAGNOSIS — R200 Anesthesia of skin: Secondary | ICD-10-CM | POA: Diagnosis not present

## 2021-06-05 DIAGNOSIS — R202 Paresthesia of skin: Secondary | ICD-10-CM

## 2021-06-05 MED ORDER — ALPRAZOLAM 0.5 MG PO TABS: ORAL_TABLET | ORAL | 0 refills | Status: DC

## 2021-06-11 ENCOUNTER — Ambulatory Visit: Payer: BC Managed Care – PPO | Admitting: Family Medicine

## 2021-06-11 ENCOUNTER — Ambulatory Visit: Payer: Self-pay | Admitting: Neurology

## 2021-06-13 ENCOUNTER — Telehealth: Payer: Self-pay | Admitting: Neurology

## 2021-06-13 DIAGNOSIS — Z1389 Encounter for screening for other disorder: Secondary | ICD-10-CM

## 2021-06-13 NOTE — Telephone Encounter (Signed)
MR Brain wo contrast & MR Cervical spine wo contrast Dr. Cheree Ditto Josem Kaufmann: [id number] (exp. 06/12/21 to 07/11/21. Patient is [facility] at [facility] for [facility]/19/21. ? 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 I informed him he would need to get an xray of the orbits at [facility] any time before his appointment next week. I also informed him he can just walk in and get it done.  ? ?Please order in for the xray thank you.  ?

## 2021-06-17 ENCOUNTER — Ambulatory Visit (INDEPENDENT_AMBULATORY_CARE_PROVIDER_SITE_OTHER)
Admission: RE | Admit: 2021-06-17 | Discharge: 2021-06-17 | Disposition: A | Discharge status: Home / Self Care | Payer: BC Managed Care – PPO | Source: Ambulatory Visit | Attending: Neurology | Admitting: Neurology

## 2021-06-17 ENCOUNTER — Other Ambulatory Visit: Payer: Self-pay | Admitting: Neurology

## 2021-06-17 ENCOUNTER — Encounter: Payer: Self-pay | Admitting: Family Medicine

## 2021-06-17 ENCOUNTER — Other Ambulatory Visit: Payer: Self-pay

## 2021-06-17 ENCOUNTER — Ambulatory Visit: Payer: BC Managed Care – PPO | Admitting: Family Medicine

## 2021-06-17 VITALS — BP 148/86 | HR 83 | Temp 97.5°F | Ht 69.5 in | Wt 273.4 lb

## 2021-06-17 DIAGNOSIS — Z1389 Encounter for screening for other disorder: Secondary | ICD-10-CM

## 2021-06-17 DIAGNOSIS — M0600 Rheumatoid arthritis without rheumatoid factor, unspecified site: Secondary | ICD-10-CM

## 2021-06-17 DIAGNOSIS — I152 Hypertension secondary to endocrine disorders: Secondary | ICD-10-CM | POA: Diagnosis not present

## 2021-06-17 DIAGNOSIS — Z760 Encounter for issue of repeat prescription: Secondary | ICD-10-CM | POA: Diagnosis not present

## 2021-06-17 DIAGNOSIS — E1169 Type 2 diabetes mellitus with other specified complication: Secondary | ICD-10-CM

## 2021-06-17 DIAGNOSIS — M1A9XX0 Chronic gout, unspecified, without tophus (tophi): Secondary | ICD-10-CM

## 2021-06-17 DIAGNOSIS — E785 Hyperlipidemia, unspecified: Secondary | ICD-10-CM | POA: Diagnosis not present

## 2021-06-17 DIAGNOSIS — E1159 Type 2 diabetes mellitus with other circulatory complications: Secondary | ICD-10-CM

## 2021-06-17 DIAGNOSIS — E1140 Type 2 diabetes mellitus with diabetic neuropathy, unspecified: Secondary | ICD-10-CM

## 2021-06-17 DIAGNOSIS — I25119 Atherosclerotic heart disease of native coronary artery with unspecified angina pectoris: Secondary | ICD-10-CM

## 2021-06-17 DIAGNOSIS — Z125 Encounter for screening for malignant neoplasm of prostate: Secondary | ICD-10-CM

## 2021-06-17 DIAGNOSIS — Z0000 Encounter for general adult medical examination without abnormal findings: Secondary | ICD-10-CM | POA: Diagnosis not present

## 2021-06-17 DIAGNOSIS — M5412 Radiculopathy, cervical region: Secondary | ICD-10-CM

## 2021-06-17 DIAGNOSIS — Z01818 Encounter for other preprocedural examination: Secondary | ICD-10-CM | POA: Diagnosis not present

## 2021-06-17 LAB — POC URINALSYSI DIPSTICK (AUTOMATED)
Bilirubin, UA: NEGATIVE
Blood, UA: NEGATIVE
Glucose, UA: NEGATIVE
Ketones, UA: NEGATIVE
Leukocytes, UA: NEGATIVE
Nitrite, UA: NEGATIVE
Protein, UA: NEGATIVE
Spec Grav, UA: 1.025 (ref 1.010–1.025)
Urobilinogen, UA: 0.2 E.U./dL
pH, UA: 6 (ref 5.0–8.0)

## 2021-06-17 LAB — LIPID PANEL
Cholesterol: 110 mg/dL (ref 0–200)
HDL: 43.1 mg/dL (ref >39.00)
LDL Cholesterol: 35 mg/dL (ref 0–99)
NonHDL: 67.35
Total CHOL/HDL Ratio: 3
Triglycerides: 162 mg/dL — ABNORMAL HIGH (ref 0.0–149.0)
VLDL: 32.4 mg/dL (ref 0.0–40.0)

## 2021-06-17 LAB — BASIC METABOLIC PANEL
BUN: 14 mg/dL (ref 6–23)
CO2: 29 mEq/L (ref 19–32)
Calcium: 10.2 mg/dL (ref 8.4–10.5)
Chloride: 102 mEq/L (ref 96–112)
Creatinine, Ser: 0.96 mg/dL (ref 0.40–1.50)
GFR: 87.85 mL/min (ref >60.00)
Glucose, Bld: 135 mg/dL — ABNORMAL HIGH (ref 70–99)
Potassium: 4.5 mEq/L (ref 3.5–5.1)
Sodium: 140 mEq/L (ref 135–145)

## 2021-06-17 LAB — HEMOGLOBIN A1C: Hgb A1c MFr Bld: 7.8 % — ABNORMAL HIGH (ref 4.6–6.5)

## 2021-06-17 LAB — MICROALBUMIN / CREATININE URINE RATIO
Creatinine,U: 74.7 mg/dL
Microalb Creat Ratio: 1 mg/g (ref 0.0–30.0)
Microalb, Ur: 0.7 mg/dL (ref 0.0–1.9)

## 2021-06-17 LAB — SEDIMENTATION RATE: Sed Rate: 21 mm/hr — ABNORMAL HIGH (ref 0–20)

## 2021-06-17 MED ORDER — OLMESARTAN MEDOXOMIL 40 MG PO TABS: 40.0000 mg | ORAL_TABLET | Freq: Every day | ORAL | 3 refills | Status: DC

## 2021-06-17 MED ORDER — PIOGLITAZONE HCL 30 MG PO TABS: 30.0000 mg | ORAL_TABLET | Freq: Every day | ORAL | 3 refills | Status: DC

## 2021-06-17 MED ORDER — GLIMEPIRIDE 2 MG PO TABS: 2.0000 mg | ORAL_TABLET | Freq: Every day | ORAL | 3 refills | Status: DC

## 2021-06-17 MED ORDER — ATORVASTATIN CALCIUM 40 MG PO TABS: 40.0000 mg | ORAL_TABLET | Freq: Every day | ORAL | 3 refills | Status: DC

## 2021-06-17 MED ORDER — AMLODIPINE BESYLATE 5 MG PO TABS: 5.0000 mg | ORAL_TABLET | Freq: Every day | ORAL | 3 refills | Status: DC

## 2021-06-17 NOTE — Patient Instructions (Addendum)
Labs today, urinalysis today.  ?For heel pain - frozen water bottle massage, towel stretches every day.  ?Good to see you today. Return as needed or in 6 months for diabetes follow up visit.  ? ?Health Maintenance, Male ?Adopting a healthy lifestyle and getting preventive care are important in promoting health and wellness. Ask your health care provider about: ?The right schedule for you to have regular tests and exams. ?Things you can do on your own to prevent diseases and keep yourself healthy. ?What should I know about diet, weight, and exercise? ?Eat a healthy diet ? ?Eat a diet that includes plenty of vegetables, fruits, low-fat dairy products, and lean protein. ?Do not eat a lot of foods that are high in solid fats, added sugars, or sodium. ?Maintain a healthy weight ?Body mass index (BMI) is a measurement that can be used to identify possible weight problems. It estimates body fat based on height and weight. Your health care provider can help determine your BMI and help you achieve or maintain a healthy weight. ?Get regular exercise ?Get regular exercise. This is one of the most important things you can do for your health. Most adults should: ?Exercise for at least 150 minutes each week. The exercise should increase your heart rate and make you sweat (moderate-intensity exercise). ?Do strengthening exercises at least twice a week. This is in addition to the moderate-intensity exercise. ?Spend less time sitting. Even light physical activity can be beneficial. ?Watch cholesterol and blood lipids ?Have your blood tested for lipids and cholesterol at 58 years of age, then have this test every 5 years. ?You may need to have your cholesterol levels checked more often if: ?Your lipid or cholesterol levels are high. ?You are older than 58 years of age. ?You are at high risk for heart disease. ?What should I know about cancer screening? ?Many types of cancers can be detected early and may often be prevented. Depending  on your health history and family history, you may need to have cancer screening at various ages. This may include screening for: ?Colorectal cancer. ?Prostate cancer. ?Skin cancer. ?Lung cancer. ?What should I know about heart disease, diabetes, and high blood pressure? ?Blood pressure and heart disease ?High blood pressure causes heart disease and increases the risk of stroke. This is more likely to develop in people who have high blood pressure readings or are overweight. ?Talk with your health care provider about your target blood pressure readings. ?Have your blood pressure checked: ?Every 3-5 years if you are 73-64 years of age. ?Every year if you are 52 years old or older. ?If you are between the ages of 59 and 71 and are a current or former smoker, ask your health care provider if you should have a one-time screening for abdominal aortic aneurysm (AAA). ?Diabetes ?Have regular diabetes screenings. This checks your fasting blood sugar level. Have the screening done: ?Once every three years after age 64 if you are at a normal weight and have a low risk for diabetes. ?More often and at a younger age if you are overweight or have a high risk for diabetes. ?What should I know about preventing infection? ?Hepatitis B ?If you have a higher risk for hepatitis B, you should be screened for this virus. Talk with your health care provider to find out if you are at risk for hepatitis B infection. ?Hepatitis C ?Blood testing is recommended for: ?Everyone born from [birth year] through 1967. ?Anyone with known risk factors for hepatitis C. ?Sexually transmitted  infections (STIs) ?You should be screened each year for STIs, including gonorrhea and chlamydia, if: ?You are sexually active and are younger than 58 years of age. ?You are older than 58 years of age and your health care provider tells you that you are at risk for this type of infection. ?Your sexual activity has changed since you were last screened, and you are at  increased risk for chlamydia or gonorrhea. Ask your health care provider if you are at risk. ?Ask your health care provider about whether you are at high risk for HIV. Your health care provider may recommend a prescription medicine to help prevent HIV infection. If you choose to take medicine to prevent HIV, you should first get tested for HIV. You should then be tested every 3 months for as long as you are taking the medicine. ?Follow these instructions at home: ?Alcohol use ?Do not drink alcohol if your health care provider tells you not to drink. ?If you drink alcohol: ?Limit how much you have to 0-2 drinks a day. ?Know how much alcohol is in your drink. In the [place] one drink equals one 12 oz bottle of beer (355 mL), one 5 oz glass of wine (148 mL), or one 1? oz glass of hard liquor (44 mL). ?Lifestyle ?Do not use any products that contain nicotine or tobacco. These products include cigarettes, chewing tobacco, and vaping devices, such as e-cigarettes. If you need help quitting, ask your health care provider. ?Do not use street drugs. ?Do not share needles. ?Ask your health care provider for help if you need support or information about quitting drugs. ?General instructions ?Schedule regular health, dental, and eye exams. ?Stay current with your vaccines. ?Tell your health care provider if: ?You often feel depressed. ?You have ever been abused or do not feel safe at home. ?Summary ?Adopting a healthy lifestyle and getting preventive care are important in promoting health and wellness. ?Follow your health care provider's instructions about healthy diet, exercising, and getting tested or screened for diseases. ?Follow your health care provider's instructions on monitoring your cholesterol and blood pressure. ?This information is not intended to replace advice given to you by your health care provider. Make sure you discuss any questions you have with your health care provider. ?Document Revised: 07/30/2020  Document Reviewed: 07/30/2020 ?Elsevier Patient Education ? [place]. ? ?

## 2021-06-17 NOTE — Addendum Note (Signed)
Addended by: Brenton Grills on: 4/66/5993 57:01 AM ? ? Modules accepted: Orders ? ?

## 2021-06-17 NOTE — Assessment & Plan Note (Signed)
Gabapentin '900mg'$  daily ineffective so he stopped.  ?Saw neurology pending brain/cervical neck MRI. ?

## 2021-06-17 NOTE — Assessment & Plan Note (Addendum)
Chronic, above goal. Advised start monitoring BP at home and if persistently >140/90 to let me know to add 3rd BP med.  ?

## 2021-06-17 NOTE — Assessment & Plan Note (Signed)
Quiet period off MTX. Notes some hand pain over the past month. Discussed return to rheum - he will let me know if interested. Update ESR.  ?

## 2021-06-17 NOTE — Assessment & Plan Note (Addendum)
Chronic on atorvastatin - continue this, update FLP.  ?The 10-year ASCVD risk score (Arnett DK, et al., 2019) is: 25.2% ?  Values used to calculate the score: ?    Age: 56 years ?    Sex: Male ?    Is Non-Hispanic African American: No ?    Diabetic: Yes ?    Tobacco smoker: No ?    Systolic Blood Pressure: 861 mmHg ?    Is BP treated: Yes ?    HDL Cholesterol: 32.9 mg/dL ?    Total Cholesterol: 180 mg/dL  ?

## 2021-06-17 NOTE — Assessment & Plan Note (Signed)
Preventative protocols reviewed and updated unless pt declined. Discussed healthy diet and lifestyle.  

## 2021-06-17 NOTE — Assessment & Plan Note (Signed)
Chronic on actos and amaryl. Update UA and A1c today.  ?

## 2021-06-17 NOTE — Progress Notes (Signed)
? ? Patient ID: Joel Bennett, male    DOB: [birth year], 56 y.o.   MRN: [id number] ? ?This visit was conducted in person. ? ?BP (!) 148/86 (BP Location: Right Arm, Cuff Size: Large)   Pulse 83   Temp (!) 97.5 ?F (36.4 ?C) (Temporal)   Ht 5' 9.5" (1.765 m)   Wt 273 lb 6 oz (124 kg)   SpO2 96%   BMI 39.79 kg/m?   ? ?CC: CPE ?Subjective:  ? ?HPI: ?Joel Bennett is a 56 y.o. male presenting on 06/17/2021 for Annual Exam ? ? ?H/o seronegative RA saw rheum Marella Chimes PA) 04/2019 - previously on MTX weekly without benefit so stopped. No further hand redness or warmth but hands stay painful/swollen.  ? ?DM - managed on amaryl '2mg'$  daily and actos '30mg'$  daily. Did not tolerate metformin XR or ozempic, and rybelsus unaffordable. Continues gabapentin '300mg'$  BID. Not checking sugars at home.  ? ?HTN - recent elevated BP readings s/p ER evaluation for headache with facial paresthesias. He is currently on amlodipine '5mg'$  daily, benicar '40mg'$  daily.  ? ?Saw neurology Dr Felecia Shelling 2 wks ago, note reviewed. Suspected cervical neck issue. Pending MR cervical spine and MR brain on Wednesday for further evaluation.  ? ?Preventative: ?COLONOSCOPY 11/2018 - TA, rpt 7 yrs Ardis Hughs) ?Prostate cancer screening - discussed, yearly PSA, declines rectal exam. ?Lung cancer screening - not eligible  ?Flu shot - declines  ?Td - ~2009. Declines  ?Pneumovax - declines  ?Shingrix - declines ?Seat belt use discussed ?Sunscreen use discussed. Denies changing moles on skin.  ?Sleep - averaging 6 hours/night  ?Non smoker  ?Alcohol - 1-2 times a week, 5-6 beers per sitting ?Dentist - does not see  ?Eye exam yearly  ? ?Lives with wife ?Grown children ?Occ: Database administrator ?Edu: HS ?Activity: no regular exercise, stays active at work and in yard ?Diet: good water, fruits/vegetables some, red meat regularly ?   ? ?Relevant past medical, surgical, family and social history reviewed and updated as indicated. Interim medical history since our last visit  reviewed. ?Allergies and medications reviewed and updated. ?Outpatient Medications Prior to Visit  ?Medication Sig Dispense Refill  ? ALPRAZolam (XANAX) 0.5 MG tablet Take 2 - 3 pills before MRI 3 tablet 0  ? aspirin EC 81 MG tablet Take 1 tablet (81 mg total) by mouth daily.    ? indomethacin (INDOCIN) 50 MG capsule Take 1 capsule (50 mg total) by mouth 3 (three) times daily as needed. 30 capsule 0  ? vitamin B-12 (CYANOCOBALAMIN) 1000 MCG tablet Take 1 tablet (1,000 mcg total) by mouth daily.    ? amLODipine (NORVASC) 5 MG tablet Take 1 tablet (5 mg total) by mouth daily. 30 tablet 1  ? atorvastatin (LIPITOR) 40 MG tablet Take 1 tablet (40 mg total) by mouth daily. 90 tablet 3  ? gabapentin (NEURONTIN) 300 MG capsule TAKE ONE CAPSULE BY MOUTH TWICE A DAY 60 capsule 3  ? glimepiride (AMARYL) 2 MG tablet Take 1 tablet (2 mg total) by mouth daily before breakfast. 90 tablet 0  ? olmesartan (BENICAR) 40 MG tablet TAKE 1 TABLET BY MOUTH EVERY DAY 90 tablet 0  ? pioglitazone (ACTOS) 30 MG tablet Take 1 tablet (30 mg total) by mouth daily. 90 tablet 1  ? ?No facility-administered medications prior to visit.  ?  ? ?Per HPI unless specifically indicated in ROS section below ?Review of Systems  ?Constitutional:  Negative for activity change, appetite change, chills, fatigue, fever and unexpected weight  change.  ?HENT:  Negative for hearing loss.   ?Eyes:  Negative for visual disturbance.  ?Respiratory:  Negative for cough, chest tightness, shortness of breath and wheezing.   ?Cardiovascular:  Positive for leg swelling (foot swelling). Negative for chest pain and palpitations.  ?Gastrointestinal:  Negative for abdominal distention, abdominal pain, blood in stool, constipation, diarrhea, nausea and vomiting.  ?Genitourinary:  Negative for difficulty urinating and hematuria.  ?Musculoskeletal:  Positive for joint swelling (hands, feet predominnatly left heel). Negative for arthralgias, myalgias and neck pain.  ?Skin:  Negative  for rash.  ?Neurological:  Negative for dizziness, seizures, syncope and headaches.  ?Hematological:  Negative for adenopathy. Does not bruise/bleed easily.  ?Psychiatric/Behavioral:  Negative for dysphoric mood. The patient is not nervous/anxious.   ? ?Objective:  ?BP (!) 148/86 (BP Location: Right Arm, Cuff Size: Large)   Pulse 83   Temp (!) 97.5 ?F (36.4 ?C) (Temporal)   Ht 5' 9.5" (1.765 m)   Wt 273 lb 6 oz (124 kg)   SpO2 96%   BMI 39.79 kg/m?   ?Wt Readings from Last 3 Encounters:  ?06/17/21 273 lb 6 oz (124 kg)  ?06/05/21 274 lb (124.3 kg)  ?05/13/21 269 lb 7 oz (122.2 kg)  ?  ?  ?Physical Exam ?Vitals and nursing note reviewed.  ?Constitutional:   ?   General: He is not in acute distress. ?   Appearance: Normal appearance. He is well-developed. He is not ill-appearing.  ?HENT:  ?   Head: Normocephalic and atraumatic.  ?   Right Ear: Hearing, tympanic membrane, ear canal and external ear normal.  ?   Left Ear: Hearing, tympanic membrane, ear canal and external ear normal.  ?Eyes:  ?   General: No scleral icterus. ?   Extraocular Movements: Extraocular movements intact.  ?   Conjunctiva/sclera: Conjunctivae normal.  ?   Pupils: Pupils are equal, round, and reactive to light.  ?Neck:  ?   Thyroid: No thyroid mass or thyromegaly.  ?   Vascular: No carotid bruit.  ?Cardiovascular:  ?   Rate and Rhythm: Normal rate and regular rhythm.  ?   Pulses: Normal pulses.     ?     Radial pulses are 2+ on the right side and 2+ on the left side.  ?   Heart sounds: Normal heart sounds. No murmur heard. ?Pulmonary:  ?   Effort: Pulmonary effort is normal. No respiratory distress.  ?   Breath sounds: Normal breath sounds. No wheezing, rhonchi or rales.  ?Abdominal:  ?   General: Bowel sounds are normal. There is no distension.  ?   Palpations: Abdomen is soft. There is no mass.  ?   Tenderness: There is no abdominal tenderness. There is no guarding or rebound.  ?   Hernia: No hernia is present.  ?Musculoskeletal:     ?    General: Normal range of motion.  ?   Cervical back: Normal range of motion and neck supple.  ?   Right lower leg: No edema.  ?   Left lower leg: No edema.  ?Lymphadenopathy:  ?   Cervical: No cervical adenopathy.  ?Skin: ?   General: Skin is warm and dry.  ?   Findings: No rash.  ?Neurological:  ?   General: No focal deficit present.  ?   Mental Status: He is alert and oriented to person, place, and time.  ?Psychiatric:     ?   Mood and Affect: Mood normal.     ?  Behavior: Behavior normal.     ?   Thought Content: Thought content normal.     ?   Judgment: Judgment normal.  ? ?   ?Results for orders placed or performed during the hospital encounter of 05/08/21  ?CBC with Differential  ?Result Value Ref Range  ? WBC 7.1 4.0 - 10.5 K/uL  ? RBC 4.72 4.22 - 5.81 MIL/uL  ? Hemoglobin 15.0 13.0 - 17.0 g/dL  ? HCT 46.1 39.0 - 52.0 %  ? MCV 97.7 80.0 - 100.0 fL  ? MCH 31.8 26.0 - 34.0 pg  ? MCHC 32.5 30.0 - 36.0 g/dL  ? RDW 13.7 11.5 - 15.5 %  ? Platelets 185 150 - 400 K/uL  ? nRBC 0.0 0.0 - 0.2 %  ? Neutrophils Relative % 57 %  ? Neutro Abs 4.0 1.7 - 7.7 K/uL  ? Lymphocytes Relative 31 %  ? Lymphs Abs 2.2 0.7 - 4.0 K/uL  ? Monocytes Relative 9 %  ? Monocytes Absolute 0.7 0.1 - 1.0 K/uL  ? Eosinophils Relative 2 %  ? Eosinophils Absolute 0.2 0.0 - 0.5 K/uL  ? Basophils Relative 1 %  ? Basophils Absolute 0.0 0.0 - 0.1 K/uL  ? Immature Granulocytes 0 %  ? Abs Immature Granulocytes 0.01 0.00 - 0.07 K/uL  ?Basic metabolic panel  ?Result Value Ref Range  ? Sodium 138 135 - 145 mmol/L  ? Potassium 3.9 3.5 - 5.1 mmol/L  ? Chloride 104 98 - 111 mmol/L  ? CO2 23 22 - 32 mmol/L  ? Glucose, Bld 137 (H) 70 - 99 mg/dL  ? BUN 8 6 - 20 mg/dL  ? Creatinine, Ser 0.91 0.61 - 1.24 mg/dL  ? Calcium 9.2 8.9 - 10.3 mg/dL  ? GFR, Estimated >60 >60 mL/min  ? Anion gap 11 5 - 15  ? ?Lab Results  ?Component Value Date  ? CHOL 180 11/02/2020  ? HDL 32.90 (L) 11/02/2020  ? [place] 94 10/28/2018  ? LDLDIRECT 112.0 11/02/2020  ? TRIG 327.0 (H)  11/02/2020  ? CHOLHDL 5 11/02/2020  ?  ?Lab Results  ?Component Value Date  ? HGBA1C 7.8 (A) 02/05/2021  ?  ?Assessment & Plan:  ?This visit occurred during the SARS-CoV-2 public health emergency.  Safety protocols were in pl

## 2021-06-17 NOTE — Assessment & Plan Note (Signed)
Stable period without recent gout flare - uses indocin PRN ?

## 2021-06-17 NOTE — Assessment & Plan Note (Signed)
Saw cardiology. Did not tolerate imdur. Currently asxs on aspirin and statin. Consider BB if needed.  ?

## 2021-06-18 LAB — PSA: PSA: 0.8 ng/mL (ref 0.10–4.00)

## 2021-06-19 ENCOUNTER — Ambulatory Visit: Payer: BC Managed Care – PPO

## 2021-06-19 ENCOUNTER — Other Ambulatory Visit: Payer: Self-pay

## 2021-06-19 ENCOUNTER — Other Ambulatory Visit (INDEPENDENT_AMBULATORY_CARE_PROVIDER_SITE_OTHER): Payer: BC Managed Care – PPO

## 2021-06-19 DIAGNOSIS — R200 Anesthesia of skin: Secondary | ICD-10-CM

## 2021-06-19 DIAGNOSIS — R202 Paresthesia of skin: Secondary | ICD-10-CM | POA: Diagnosis not present

## 2021-06-19 DIAGNOSIS — Z8739 Personal history of other diseases of the musculoskeletal system and connective tissue: Secondary | ICD-10-CM

## 2021-06-19 LAB — URIC ACID: Uric Acid, Serum: 7.9 mg/dL — ABNORMAL HIGH (ref 4.0–7.8)

## 2021-06-26 ENCOUNTER — Ambulatory Visit: Payer: Self-pay | Admitting: Neurology

## 2021-07-07 ENCOUNTER — Other Ambulatory Visit: Payer: Self-pay | Admitting: Nurse Practitioner

## 2021-07-07 DIAGNOSIS — Z760 Encounter for issue of repeat prescription: Secondary | ICD-10-CM

## 2021-07-08 NOTE — Telephone Encounter (Signed)
Refill request Indocin ?Last refill 04/25/21 #30 ?Last office visit 06/17/21 ?

## 2021-07-09 MED ORDER — INDOMETHACIN 50 MG PO CAPS: 50.0000 mg | ORAL_CAPSULE | Freq: Three times a day (TID) | ORAL | 0 refills | Status: DC | PRN

## 2021-08-09 ENCOUNTER — Other Ambulatory Visit: Payer: Self-pay | Admitting: Family Medicine

## 2021-08-09 DIAGNOSIS — Z760 Encounter for issue of repeat prescription: Secondary | ICD-10-CM

## 2021-08-09 MED ORDER — INDOMETHACIN 50 MG PO CAPS: 50.0000 mg | ORAL_CAPSULE | Freq: Three times a day (TID) | ORAL | 0 refills | Status: DC | PRN

## 2021-08-09 NOTE — Telephone Encounter (Signed)
Indomethacin Last rx:  07/09/21, #30 Last OV:  06/17/21, CPE Next OV:  none

## 2021-08-22 ENCOUNTER — Ambulatory Visit (INDEPENDENT_AMBULATORY_CARE_PROVIDER_SITE_OTHER): Payer: BC Managed Care – PPO | Admitting: Family Medicine

## 2021-08-22 ENCOUNTER — Encounter: Payer: Self-pay | Admitting: Family Medicine

## 2021-08-22 VITALS — BP 130/80 | HR 66 | Temp 98.3°F | Ht 69.25 in | Wt 274.0 lb

## 2021-08-22 DIAGNOSIS — M10471 Other secondary gout, right ankle and foot: Secondary | ICD-10-CM | POA: Diagnosis not present

## 2021-08-22 DIAGNOSIS — M722 Plantar fascial fibromatosis: Secondary | ICD-10-CM

## 2021-08-22 DIAGNOSIS — E1140 Type 2 diabetes mellitus with diabetic neuropathy, unspecified: Secondary | ICD-10-CM | POA: Diagnosis not present

## 2021-08-22 MED ORDER — PREDNISONE 20 MG PO TABS: ORAL_TABLET | ORAL | 0 refills | Status: DC

## 2021-08-22 MED ORDER — COLCHICINE 0.6 MG PO TABS: 0.6000 mg | ORAL_TABLET | Freq: Two times a day (BID) | ORAL | 1 refills | Status: DC

## 2021-08-22 NOTE — Progress Notes (Signed)
Joel Bennett T. Lenita Peregrina, MD, [place] at [facility] [place], [address]  Phone: [phone]  FAX: [phone]  Joel Bennett - 56 y.o. male  MRN [id number]  Date of Birth: 1965-06-09  Date: 08/22/2021  PCP: Ria Bush, MD  Referral: Ria Bush, MD  Chief Complaint  Patient presents with   Foot Pain    Bilateral but right is worse   Subjective:   Joel Bennett is a 56 y.o. very pleasant male patient with Body mass index is 40.17 kg/m. who presents with the following:  Over the last 2 weeks, the patient has had some right-sided pain and swelling as well as some mild redness in the forefoot predominantly at the third and second MTP joints.  He has not had any kind of traumatic injury, fall or problem he could have had any injury to the region.  He does have a history of gout, and he has tried some indomethacin but his symptoms are persisting.  He also has history of some left-sided plantar fasciitis, he has been quite diligent with footwear changes as well as doing some regular stretching that we reviewed earlier in the year.  This has been slowly improving.   Lab Results  Component Value Date   HGBA1C 7.8 (H) 06/17/2021      Review of Systems is noted in the HPI, as appropriate  Objective:   BP 130/80   Pulse 66   Temp 98.3 F (36.8 C) (Oral)   Ht 5' 9.25" (1.759 m)   Wt 274 lb (124.3 kg)   SpO2 99%   BMI 40.17 kg/m   GEN: No acute distress; alert,appropriate. PULM: Breathing comfortably in no respiratory distress PSYCH: Normally interactive.   Right foot or if it is mildly swollen compared to the contralateral side.  There is some mild degree of redness and it may be slightly warm.  Minimal to no tenderness at the first MTP joint, but the second and third MTP joints are tender to palpation and with flexion extension at digits.  No significant tenderness in the other parts of the  forefoot, midfoot, hindfoot.  The remainder of the ankle joint is nontender.  On the left the patient has some tenderness on the plantar aspect of the calcaneus, but an additional foot and ankle exam in terms of the bony anatomy and soft tissue is nontender.  Laboratory and Imaging Data:  Assessment and Plan:     ICD-10-CM   1. Acute gout due to other secondary cause involving toe of right foot  M10.471     2. Plantar fasciitis of left foot  M72.2     3. Type 2 diabetes mellitus with diabetic neuropathy, without long-term current use of insulin (HCC)  E11.40      Acute on chronic right-sided gout with exacerbation.  No inciting event, diet changes.  Failure of indomethacin, we will do a pulse of some steroids as well as some colchicine.  Does have a history of diabetes, this is mildly uncontrolled.  The patient is not on insulin, so I think that his blood sugars will tolerate this.  If symptoms persist, can always do an MTP joint injection.  Medication Management during today's office visit: Meds ordered this encounter  Medications   predniSONE (DELTASONE) 20 MG tablet    Sig: 2 tabs po daily for 5 days, then 1 tab po daily for 5 days    Dispense:  15 tablet  Refill:  0   colchicine 0.6 MG tablet    Sig: Take 1 tablet (0.6 mg total) by mouth 2 (two) times daily.    Dispense:  60 tablet    Refill:  1   Medications Discontinued During This Encounter  Medication Reason   ALPRAZolam (XANAX) 0.5 MG tablet Completed Course    Orders placed today for conditions managed today: No orders of the defined types were placed in this encounter.   Follow-up if needed: No follow-ups on file.  [employer] One speech-to-text software was used for transcription in this dictation.  Possible transcriptional errors can occur using Editor, commissioning.   Signed,  Maud Deed. Glendy Barsanti, MD   Outpatient Encounter Medications as of 08/22/2021  Medication Sig   amLODipine (NORVASC) 5 MG tablet  Take 1 tablet (5 mg total) by mouth daily.   aspirin EC 81 MG tablet Take 1 tablet (81 mg total) by mouth daily.   atorvastatin (LIPITOR) 40 MG tablet Take 1 tablet (40 mg total) by mouth daily.   colchicine 0.6 MG tablet Take 1 tablet (0.6 mg total) by mouth 2 (two) times daily.   glimepiride (AMARYL) 2 MG tablet Take 1 tablet (2 mg total) by mouth daily before breakfast.   indomethacin (INDOCIN) 50 MG capsule Take 1 capsule (50 mg total) by mouth 3 (three) times daily as needed.   olmesartan (BENICAR) 40 MG tablet Take 1 tablet (40 mg total) by mouth daily.   pioglitazone (ACTOS) 30 MG tablet Take 1 tablet (30 mg total) by mouth daily.   predniSONE (DELTASONE) 20 MG tablet 2 tabs po daily for 5 days, then 1 tab po daily for 5 days   vitamin B-12 (CYANOCOBALAMIN) 1000 MCG tablet Take 1 tablet (1,000 mcg total) by mouth daily.   [DISCONTINUED] ALPRAZolam (XANAX) 0.5 MG tablet Take 2 - 3 pills before MRI   No facility-administered encounter medications on file as of 08/22/2021.

## 2021-10-10 DIAGNOSIS — E119 Type 2 diabetes mellitus without complications: Secondary | ICD-10-CM | POA: Diagnosis not present

## 2021-10-10 DIAGNOSIS — H524 Presbyopia: Secondary | ICD-10-CM | POA: Diagnosis not present

## 2021-10-10 DIAGNOSIS — H5203 Hypermetropia, bilateral: Secondary | ICD-10-CM | POA: Diagnosis not present

## 2021-10-10 LAB — HM DIABETES EYE EXAM

## 2021-10-22 ENCOUNTER — Other Ambulatory Visit: Payer: Self-pay | Admitting: Family Medicine

## 2021-10-22 NOTE — Telephone Encounter (Signed)
Colchicine Last filled:  09/20/21, #60 Last OV:  06/17/21, CPE Next OV:  11/19/21, meds

## 2021-10-23 ENCOUNTER — Encounter: Payer: Self-pay | Admitting: Family Medicine

## 2021-11-19 ENCOUNTER — Encounter: Payer: Self-pay | Admitting: Family Medicine

## 2021-11-19 ENCOUNTER — Ambulatory Visit: Payer: BC Managed Care – PPO | Admitting: Family Medicine

## 2021-11-19 VITALS — BP 138/82 | HR 65 | Temp 97.8°F | Ht 69.25 in | Wt 279.4 lb

## 2021-11-19 DIAGNOSIS — E1140 Type 2 diabetes mellitus with diabetic neuropathy, unspecified: Secondary | ICD-10-CM

## 2021-11-19 DIAGNOSIS — M1A0710 Idiopathic chronic gout, right ankle and foot, without tophus (tophi): Secondary | ICD-10-CM

## 2021-11-19 LAB — POCT GLYCOSYLATED HEMOGLOBIN (HGB A1C): Hemoglobin A1C: 7.3 % — AB (ref 4.0–5.6)

## 2021-11-19 MED ORDER — EMPAGLIFLOZIN 10 MG PO TABS
10.0000 mg | ORAL_TABLET | Freq: Every day | ORAL | 6 refills | Status: DC
Start: 2021-11-19 — End: 2022-06-17

## 2021-11-19 MED ORDER — PIOGLITAZONE HCL 15 MG PO TABS: 15.0000 mg | ORAL_TABLET | Freq: Every day | ORAL | 3 refills | Status: DC

## 2021-11-19 MED ORDER — FREESTYLE LITE TEST VI STRP: ORAL_STRIP | 3 refills | Status: DC

## 2021-11-19 NOTE — Assessment & Plan Note (Signed)
Recent gout flare effectively treated with colchicine and prednisone taper. Discussed colchicine and indomethacin dosing-ideally to use only as needed for gout flares.  Discussed taking 2 colchicine pills on the first day of gout flare then 1 pill daily as needed.

## 2021-11-19 NOTE — Progress Notes (Signed)
Patient ID: Joel Bennett, male    DOB: December 18, 1965, 56 y.o.   MRN: [id number]  This visit was conducted in person.  BP 138/82 (BP Location: Right Arm, Cuff Size: Large)   Pulse 65   Temp 97.8 F (36.6 C) (Temporal)   Ht 5' 9.25" (1.759 m)   Wt 279 lb 6 oz (126.7 kg)   SpO2 98%   BMI 40.96 kg/m    CC: discuss medications Subjective:   HPI: Joel Bennett is a 56 y.o. male presenting on 11/19/2021 for Discuss Medication (Wants to discuss DM meds. C/o rash and wt gain after starting med. Also, c/o higher BS readings than usual.  Also, wants to discuss colchicine. )   Seen 08/2021 by Dr Lorelei Pont for gout flare treated with prednisone taper and colchicine. Has questions about colchicine dosing.   H/o seronegative RA previously saw rheum on MTX (2021), now off this.   DM - does regularly check sugars fastin 130s. Compliant with antihyperglycemic regimen which includes: actos '30mg'$  daily, glimepiride '2mg'$  daily with breakfast. Metformin not tolerated due to diarrhea, ozempic not tolerated due to nausea, rybelsus, jardiance unaffordable 10/2019. Now concerned with weight gain noted, leg swelling and lower leg rash since starting actos (over the past year). Denies paresthesias, blurry vision. Last diabetic eye exam 09/2021. Glucometer brand: Freestyle mini. Last foot exam: 10/2020. DSME: previously completed.  Lab Results  Component Value Date   HGBA1C 7.3 (A) 11/19/2021   Diabetic Foot Exam - Simple   Simple Foot Form Diabetic Foot exam was performed with the following findings: Yes 11/19/2021  8:54 AM  Visual Inspection No deformities, no ulcerations, no other skin breakdown bilaterally: Yes Sensation Testing Intact to touch and monofilament testing bilaterally: Yes Pulse Check Posterior Tibialis and Dorsalis pulse intact bilaterally: Yes Comments    Lab Results  Component Value Date   MICROALBUR 0.7 06/17/2021         Relevant past medical, surgical, family and social history  reviewed and updated as indicated. Interim medical history since our last visit reviewed. Allergies and medications reviewed and updated. Outpatient Medications Prior to Visit  Medication Sig Dispense Refill   amLODipine (NORVASC) 5 MG tablet Take 1 tablet (5 mg total) by mouth daily. 90 tablet 3   aspirin EC 81 MG tablet Take 1 tablet (81 mg total) by mouth daily.     atorvastatin (LIPITOR) 40 MG tablet Take 1 tablet (40 mg total) by mouth daily. 90 tablet 3   glimepiride (AMARYL) 2 MG tablet Take 1 tablet (2 mg total) by mouth daily before breakfast. 90 tablet 3   indomethacin (INDOCIN) 50 MG capsule Take 1 capsule (50 mg total) by mouth 3 (three) times daily as needed. 30 capsule 0   olmesartan (BENICAR) 40 MG tablet Take 1 tablet (40 mg total) by mouth daily. 90 tablet 3   vitamin B-12 (CYANOCOBALAMIN) 1000 MCG tablet Take 1 tablet (1,000 mcg total) by mouth daily.     pioglitazone (ACTOS) 30 MG tablet Take 1 tablet (30 mg total) by mouth daily. 90 tablet 3   colchicine 0.6 MG tablet TAKE ONE TABLET BY MOUTH TWICE A DAY (Patient not taking: Reported on 11/19/2021) 60 tablet 1   predniSONE (DELTASONE) 20 MG tablet 2 tabs po daily for 5 days, then 1 tab po daily for 5 days 15 tablet 0   No facility-administered medications prior to visit.     Per HPI unless specifically indicated in ROS section below Review of Systems  Objective:  BP 138/82 (BP Location: Right Arm, Cuff Size: Large)   Pulse 65   Temp 97.8 F (36.6 C) (Temporal)   Ht 5' 9.25" (1.759 m)   Wt 279 lb 6 oz (126.7 kg)   SpO2 98%   BMI 40.96 kg/m   Wt Readings from Last 3 Encounters:  11/19/21 279 lb 6 oz (126.7 kg)  08/22/21 274 lb (124.3 kg)  06/17/21 273 lb 6 oz (124 kg)      Physical Exam Vitals and nursing note reviewed.  Constitutional:      Appearance: Normal appearance. He is not ill-appearing.  Eyes:     Extraocular Movements: Extraocular movements intact.     Conjunctiva/sclera: Conjunctivae normal.      Pupils: Pupils are equal, round, and reactive to light.  Cardiovascular:     Rate and Rhythm: Normal rate and regular rhythm.     Pulses: Normal pulses.     Heart sounds: Normal heart sounds. No murmur heard. Pulmonary:     Effort: Pulmonary effort is normal. No respiratory distress.     Breath sounds: Normal breath sounds. No wheezing, rhonchi or rales.  Musculoskeletal:     Right lower leg: No edema.     Left lower leg: No edema.     Comments: See HPI for foot exam if done  Skin:    General: Skin is warm and dry.     Findings: No rash.  Neurological:     Mental Status: He is alert.  Psychiatric:        Mood and Affect: Mood normal.        Behavior: Behavior normal.       Results for orders placed or performed in visit on 11/19/21  POCT glycosylated hemoglobin (Hb A1C)  Result Value Ref Range   Hemoglobin A1C 7.3 (A) 4.0 - 5.6 %   HbA1c POC (<> result, manual entry)     HbA1c, POC (prediabetic range)     HbA1c, POC (controlled diabetic range)      Assessment & Plan:   Problem List Items Addressed This Visit     Gout    Recent gout flare effectively treated with colchicine and prednisone taper. Discussed colchicine and indomethacin dosing-ideally to use only as needed for gout flares.  Discussed taking 2 colchicine pills on the first day of gout flare then 1 pill daily as needed.      Type 2 diabetes mellitus with diabetic neuropathy, unspecified ([place]) - Primary    Chronic, on amaryl and actos. Notes leg swelling, weight gain and leg rash since starting actos, no chest pain or dyspnea. Anticipate med related side effects - will drop actos dose to '15mg'$  daily, will re-price out SGLT2i (Jardiance '10mg'$  daily). Reviewed mechanism of action of medication as well as side effects to watch for including risk of UTI, yeast infection groin infection. Update with how he tolerates med/if affordable.       Relevant Medications   pioglitazone (ACTOS) 15 MG tablet   empagliflozin  (JARDIANCE) 10 MG TABS tablet   Other Relevant Orders   POCT glycosylated hemoglobin (Hb A1C) (Completed)     Meds ordered this encounter  Medications   pioglitazone (ACTOS) 15 MG tablet    Sig: Take 1 tablet (15 mg total) by mouth daily.    Dispense:  90 tablet    Refill:  3    Note new dose   glucose blood (FREESTYLE LITE) test strip    Sig: Use as instructed to  check sugars once daily and as needed E11.40    Dispense:  100 each    Refill:  3   empagliflozin (JARDIANCE) 10 MG TABS tablet    Sig: Take 1 tablet (10 mg total) by mouth daily.    Dispense:  30 tablet    Refill:  6   Orders Placed This Encounter  Procedures   POCT glycosylated hemoglobin (Hb A1C)     Patient Instructions  May use indomethacin and colchicine as needed for gout flares. On first day of flare, take 2 colchicine, then 1 per day as needed afterwards.  Drop actos to '15mg'$  daily - new dose sent to pharmacy.  Price out jardiance '10mg'$  daily - sent to pharmacy. Watch for yeast infection, UTI, groin cellulitis and get evaluated right away if this occurs.   Follow up plan: Return in about 7 months (around 06/20/2022), or if symptoms worsen or fail to improve, for annual exam, prior fasting for blood work.  Ria Bush, MD

## 2021-11-19 NOTE — Assessment & Plan Note (Signed)
Chronic, on amaryl and actos. Notes leg swelling, weight gain and leg rash since starting actos, no chest pain or dyspnea. Anticipate med related side effects - will drop actos dose to '15mg'$  daily, will re-price out SGLT2i (Jardiance '10mg'$  daily). Reviewed mechanism of action of medication as well as side effects to watch for including risk of UTI, yeast infection groin infection. Update with how he tolerates med/if affordable.

## 2021-11-19 NOTE — Patient Instructions (Addendum)
May use indomethacin and colchicine as needed for gout flares. On first day of flare, take 2 colchicine, then 1 per day as needed afterwards.  Drop actos to '15mg'$  daily - new dose sent to pharmacy.  Price out jardiance '10mg'$  daily - sent to pharmacy. Watch for yeast infection, UTI, groin cellulitis and get evaluated right away if this occurs.

## 2021-12-06 ENCOUNTER — Encounter: Payer: Self-pay | Admitting: Family Medicine

## 2021-12-06 ENCOUNTER — Ambulatory Visit: Payer: BC Managed Care – PPO | Admitting: Family Medicine

## 2021-12-06 DIAGNOSIS — K130 Diseases of lips: Secondary | ICD-10-CM | POA: Diagnosis not present

## 2021-12-06 MED ORDER — TRIAMCINOLONE ACETONIDE 0.1 % MT PSTE: PASTE | OROMUCOSAL | 12 refills | Status: DC

## 2021-12-06 MED ORDER — TRIAMCINOLONE ACETONIDE 0.1 % MT PSTE: PASTE | OROMUCOSAL | 0 refills | Status: DC

## 2021-12-06 NOTE — Assessment & Plan Note (Addendum)
Not consistent with fever blister.  Shallow ulcer, but chronic appearing. rec stop peroxide, Rx triamcinolone paste BID x 1 wk.  If continues poorly healing, will refer to oral surgeon. Pt agrees with plan.

## 2021-12-06 NOTE — Progress Notes (Signed)
Patient ID: Joel Bennett, male    DOB: 09-20-1965, 56 y.o.   MRN: [id number]  This visit was conducted in person.  BP 124/76   Pulse 69   Temp (!) 97.3 F (36.3 C) (Temporal)   Ht 5' 9.25" (1.759 m)   Wt 282 lb (127.9 kg)   SpO2 96%   BMI 41.34 kg/m    CC: check sore on lip Subjective:   HPI: Joel Bennett is a 56 y.o. male presenting on 12/06/2021 for Mouth Lesions (C/o sore on lip.  Started about 1 wk ago.  Pt accompanied by wife, Joel Bennett. )   Sore to mid lower lip developed about a week ago. It is tender. Denies trauma to lip. Has tried peroxide and crest oral gumcare mouthwash.   Dips tobacco. No smoking.  Does not see dentist.      Relevant past medical, surgical, family and social history reviewed and updated as indicated. Interim medical history since our last visit reviewed. Allergies and medications reviewed and updated. Outpatient Medications Prior to Visit  Medication Sig Dispense Refill   amLODipine (NORVASC) 5 MG tablet Take 1 tablet (5 mg total) by mouth daily. 90 tablet 3   aspirin EC 81 MG tablet Take 1 tablet (81 mg total) by mouth daily.     atorvastatin (LIPITOR) 40 MG tablet Take 1 tablet (40 mg total) by mouth daily. 90 tablet 3   colchicine 0.6 MG tablet TAKE ONE TABLET BY MOUTH TWICE A DAY 60 tablet 1   empagliflozin (JARDIANCE) 10 MG TABS tablet Take 1 tablet (10 mg total) by mouth daily. 30 tablet 6   glimepiride (AMARYL) 2 MG tablet Take 1 tablet (2 mg total) by mouth daily before breakfast. 90 tablet 3   glucose blood (FREESTYLE LITE) test strip Use as instructed to check sugars once daily and as needed E11.40 100 each 3   indomethacin (INDOCIN) 50 MG capsule Take 1 capsule (50 mg total) by mouth 3 (three) times daily as needed. 30 capsule 0   olmesartan (BENICAR) 40 MG tablet Take 1 tablet (40 mg total) by mouth daily. 90 tablet 3   pioglitazone (ACTOS) 15 MG tablet Take 1 tablet (15 mg total) by mouth daily. 90 tablet 3   vitamin B-12  (CYANOCOBALAMIN) 1000 MCG tablet Take 1 tablet (1,000 mcg total) by mouth daily.     No facility-administered medications prior to visit.     Per HPI unless specifically indicated in ROS section below Review of Systems  Objective:  BP 124/76   Pulse 69   Temp (!) 97.3 F (36.3 C) (Temporal)   Ht 5' 9.25" (1.759 m)   Wt 282 lb (127.9 kg)   SpO2 96%   BMI 41.34 kg/m   Wt Readings from Last 3 Encounters:  12/06/21 282 lb (127.9 kg)  11/19/21 279 lb 6 oz (126.7 kg)  08/22/21 274 lb (124.3 kg)      Physical Exam Vitals and nursing note reviewed.  Constitutional:      Appearance: Normal appearance. He is not ill-appearing.  HENT:     Mouth/Throat:     Lips: Lesions (midline lower lip with chronic appearing shallow ulcer) present.     Mouth: Mucous membranes are moist.     Tongue: No lesions.     Palate: No mass.     Pharynx: Oropharynx is clear. No oropharyngeal exudate or posterior oropharyngeal erythema.   Musculoskeletal:     Cervical back: Normal range of motion and neck supple.  Lymphadenopathy:     Cervical: No cervical adenopathy.  Neurological:     Mental Status: He is alert.       Results for orders placed or performed in visit on 11/19/21  POCT glycosylated hemoglobin (Hb A1C)  Result Value Ref Range   Hemoglobin A1C 7.3 (A) 4.0 - 5.6 %   HbA1c POC (<> result, manual entry)     HbA1c, POC (prediabetic range)     HbA1c, POC (controlled diabetic range)      Assessment & Plan:   Problem List Items Addressed This Visit     Sore of lower lip    Not consistent with fever blister.  Shallow ulcer, but chronic appearing. rec stop peroxide, Rx triamcinolone paste BID x 1 wk.  If continues poorly healing, will refer to oral surgeon. Pt agrees with plan.         Meds ordered this encounter  Medications   DISCONTD: triamcinolone (KENALOG) 0.1 % paste    Sig: 1 application to mouth or lips twice daily for a week    Dispense:  5 g    Refill:  12    triamcinolone (KENALOG) 0.1 % paste    Sig: 1 application to mouth or lips twice daily for a week    Dispense:  5 g    Refill:  0    No refills   No orders of the defined types were placed in this encounter.    Patient Instructions  Stop peroxide.  Try steroid paste sent to pharmacy - apply twice daily for a week.  If no better, we will refer you to oral surgeon for evaluation.   Follow up plan: Return if symptoms worsen or fail to improve.  Ria Bush, MD

## 2021-12-06 NOTE — Patient Instructions (Addendum)
Stop peroxide.  Try steroid paste sent to pharmacy - apply twice daily for a week.  If no better, we will refer you to oral surgeon for evaluation.

## 2021-12-11 ENCOUNTER — Encounter: Payer: Self-pay | Admitting: Family Medicine

## 2021-12-11 DIAGNOSIS — K130 Diseases of lips: Secondary | ICD-10-CM

## 2021-12-18 NOTE — Addendum Note (Signed)
Addended by: Ria Bush on: 12/18/2021 08:58 AM   Modules accepted: Orders

## 2022-01-28 ENCOUNTER — Ambulatory Visit: Payer: BC Managed Care – PPO | Admitting: Family Medicine

## 2022-01-28 ENCOUNTER — Encounter: Payer: Self-pay | Admitting: Family Medicine

## 2022-01-28 ENCOUNTER — Ambulatory Visit (INDEPENDENT_AMBULATORY_CARE_PROVIDER_SITE_OTHER)
Admission: RE | Admit: 2022-01-28 | Discharge: 2022-01-28 | Disposition: A | Discharge status: Home / Self Care | Payer: BC Managed Care – PPO | Source: Ambulatory Visit | Attending: Family Medicine | Admitting: Family Medicine

## 2022-01-28 VITALS — BP 128/72 | HR 79 | Temp 98.0°F | Ht 69.25 in | Wt 281.0 lb

## 2022-01-28 DIAGNOSIS — R1902 Left upper quadrant abdominal swelling, mass and lump: Secondary | ICD-10-CM

## 2022-01-28 DIAGNOSIS — R109 Unspecified abdominal pain: Secondary | ICD-10-CM

## 2022-01-28 LAB — COMPREHENSIVE METABOLIC PANEL
ALT: 31 U/L (ref 0–53)
AST: 25 U/L (ref 0–37)
Albumin: 4.4 g/dL (ref 3.5–5.2)
Alkaline Phosphatase: 65 U/L (ref 39–117)
BUN: 12 mg/dL (ref 6–23)
CO2: 26 mEq/L (ref 19–32)
Calcium: 9.2 mg/dL (ref 8.4–10.5)
Chloride: 102 mEq/L (ref 96–112)
Creatinine, Ser: 0.84 mg/dL (ref 0.40–1.50)
GFR: 96.51 mL/min (ref >60.00)
Glucose, Bld: 101 mg/dL — ABNORMAL HIGH (ref 70–99)
Potassium: 3.9 mEq/L (ref 3.5–5.1)
Sodium: 138 mEq/L (ref 135–145)
Total Bilirubin: 0.7 mg/dL (ref 0.2–1.2)
Total Protein: 7.1 g/dL (ref 6.0–8.3)

## 2022-01-28 LAB — CBC WITH DIFFERENTIAL/PLATELET
Basophils Absolute: 0.1 10*3/uL (ref 0.0–0.1)
Basophils Relative: 0.9 % (ref 0.0–3.0)
Eosinophils Absolute: 0.2 10*3/uL (ref 0.0–0.7)
Eosinophils Relative: 3 % (ref 0.0–5.0)
HCT: 47 % (ref 39.0–52.0)
Hemoglobin: 15.4 g/dL (ref 13.0–17.0)
Lymphocytes Relative: 28.5 % (ref 12.0–46.0)
Lymphs Abs: 1.8 10*3/uL (ref 0.7–4.0)
MCHC: 32.8 g/dL (ref 30.0–36.0)
MCV: 99.2 fl (ref 78.0–100.0)
Monocytes Absolute: 0.6 10*3/uL (ref 0.1–1.0)
Monocytes Relative: 9.8 % (ref 3.0–12.0)
Neutro Abs: 3.7 10*3/uL (ref 1.4–7.7)
Neutrophils Relative %: 57.8 % (ref 43.0–77.0)
Platelets: 166 10*3/uL (ref 150.0–400.0)
RBC: 4.74 Mil/uL (ref 4.22–5.81)
RDW: 14.1 % (ref 11.5–15.5)
WBC: 6.4 10*3/uL (ref 4.0–10.5)

## 2022-01-28 LAB — POC URINALSYSI DIPSTICK (AUTOMATED)
Bilirubin, UA: NEGATIVE
Blood, UA: NEGATIVE
Glucose, UA: POSITIVE — AB
Leukocytes, UA: NEGATIVE
Nitrite, UA: NEGATIVE
Protein, UA: NEGATIVE
Spec Grav, UA: 1.015 (ref 1.010–1.025)
Urobilinogen, UA: 0.2 E.U./dL
pH, UA: 5.5 (ref 5.0–8.0)

## 2022-01-28 LAB — SEDIMENTATION RATE: Sed Rate: 13 mm/hr (ref 0–20)

## 2022-01-28 LAB — LIPASE: Lipase: 22 U/L (ref 11.0–59.0)

## 2022-01-28 MED ORDER — TRAMADOL HCL 50 MG PO TABS: 50.0000 mg | ORAL_TABLET | Freq: Three times a day (TID) | ORAL | 0 refills | Status: AC | PRN

## 2022-01-28 MED ORDER — VALACYCLOVIR HCL 1 G PO TABS: 1000.0000 mg | ORAL_TABLET | Freq: Three times a day (TID) | ORAL | 0 refills | Status: DC

## 2022-01-28 NOTE — Patient Instructions (Signed)
Possible shingles - continue ibuprofen '800mg'$  three times a day with meals, may continue tylenol 2 tablets 3 times a day as well for next few days. Add tramadol for breakthrough pain '50mg'$  up to 3 a day as needed.  If blistering rash on left side of body develops, start antiviral printed out today.  Labs today  Urine didn't show blood or infection.  If ongoing pain not improved with above, and no rash develops, let me know for CT abdomen/pelvis.

## 2022-01-28 NOTE — Progress Notes (Addendum)
Patient ID: Joel Bennett, male    DOB: 12/29/1965, 57 y.o.   MRN: [id number]  This visit was conducted in person.  BP 128/72   Pulse 79   Temp 98 F (36.7 C) (Temporal)   Ht 5' 9.25" (1.759 m)   Wt 281 lb (127.5 kg)   SpO2 95%   BMI 41.20 kg/m    CC: L flank pain  Subjective:   HPI: Joel Bennett is a 56 y.o. male presenting on 01/28/2022 for Flank Pain (C/o L side flank pain radiating around to LUQ of abd.  Started 01/24/22.  Denies any N/V/D. )   5d h/o left flank pain without inciting trauma/injury. Pain radiates to L upper abdomen. Pain described as hard ache as well as stabbing pain. Has never had anything like this before. Ache is constant. Stabbing pain comes and goes.   No fevers/chills, nausea/vomiting, diarrhea, constipation, indigestion, reflux, heartburn.  No chest pain, dyspnea or cough.  No burning, numbness or tingling.  H/o kidney stone several years ago that passed on its own.   Treated pain with aleve '440mg'$  bid/ advil '800mg'$  tid and tylenol without benefit.      Relevant past medical, surgical, family and social history reviewed and updated as indicated. Interim medical history since our last visit reviewed. Allergies and medications reviewed and updated. Outpatient Medications Prior to Visit  Medication Sig Dispense Refill   amLODipine (NORVASC) 5 MG tablet Take 1 tablet (5 mg total) by mouth daily. 90 tablet 3   aspirin EC 81 MG tablet Take 1 tablet (81 mg total) by mouth daily.     atorvastatin (LIPITOR) 40 MG tablet Take 1 tablet (40 mg total) by mouth daily. 90 tablet 3   colchicine 0.6 MG tablet TAKE ONE TABLET BY MOUTH TWICE A DAY 60 tablet 1   empagliflozin (JARDIANCE) 10 MG TABS tablet Take 1 tablet (10 mg total) by mouth daily. 30 tablet 6   glimepiride (AMARYL) 2 MG tablet Take 1 tablet (2 mg total) by mouth daily before breakfast. 90 tablet 3   glucose blood (FREESTYLE LITE) test strip Use as instructed to check sugars once daily and as  needed E11.40 100 each 3   indomethacin (INDOCIN) 50 MG capsule Take 1 capsule (50 mg total) by mouth 3 (three) times daily as needed. 30 capsule 0   olmesartan (BENICAR) 40 MG tablet Take 1 tablet (40 mg total) by mouth daily. 90 tablet 3   pioglitazone (ACTOS) 15 MG tablet Take 1 tablet (15 mg total) by mouth daily. 90 tablet 3   vitamin B-12 (CYANOCOBALAMIN) 1000 MCG tablet Take 1 tablet (1,000 mcg total) by mouth daily.     triamcinolone (KENALOG) 0.1 % paste 1 application to mouth or lips twice daily for a week 5 g 0   No facility-administered medications prior to visit.     Per HPI unless specifically indicated in ROS section below Review of Systems  Objective:  BP 128/72   Pulse 79   Temp 98 F (36.7 C) (Temporal)   Ht 5' 9.25" (1.759 m)   Wt 281 lb (127.5 kg)   SpO2 95%   BMI 41.20 kg/m   Wt Readings from Last 3 Encounters:  01/28/22 281 lb (127.5 kg)  12/06/21 282 lb (127.9 kg)  11/19/21 279 lb 6 oz (126.7 kg)      Physical Exam Vitals and nursing note reviewed.  Constitutional:      Appearance: Normal appearance. He is not ill-appearing.  HENT:  Head: Normocephalic and atraumatic.     Mouth/Throat:     Mouth: Mucous membranes are moist.     Pharynx: Oropharynx is clear. No oropharyngeal exudate or posterior oropharyngeal erythema.  Eyes:     Extraocular Movements: Extraocular movements intact.     Pupils: Pupils are equal, round, and reactive to light.  Cardiovascular:     Rate and Rhythm: Normal rate and regular rhythm.     Pulses: Normal pulses.     Heart sounds: Normal heart sounds. No murmur heard. Pulmonary:     Effort: Pulmonary effort is normal. No respiratory distress.     Breath sounds: Normal breath sounds. No wheezing, rhonchi or rales.  Abdominal:     General: Bowel sounds are normal. There is no distension.     Palpations: Abdomen is soft. There is no mass.     Tenderness: There is abdominal tenderness in the left upper quadrant. There is  left CVA tenderness. There is no right CVA tenderness, guarding or rebound. Negative signs include Murphy's sign.     Hernia: No hernia is present.     Comments:  Predominantly reproducible pain and sensitivity to light touch of skin of left flank, lateral side, into left upper abdomen Left upper quadrant abdominal swelling present  Musculoskeletal:     Right lower leg: No edema.     Left lower leg: No edema.  Skin:    Findings: No rash.     Comments: No evident vesicular rash throughout back or sides  Neurological:     Mental Status: He is alert.  Psychiatric:        Mood and Affect: Mood normal.        Behavior: Behavior normal.       Results for orders placed or performed in visit on 01/28/22  CBC with Differential/Platelet  Result Value Ref Range   WBC 6.4 4.0 - 10.5 K/uL   RBC 4.74 4.22 - 5.81 Mil/uL   Hemoglobin 15.4 13.0 - 17.0 g/dL   HCT 47.0 39.0 - 52.0 %   MCV 99.2 78.0 - 100.0 fl   MCHC 32.8 30.0 - 36.0 g/dL   RDW 14.1 11.5 - 15.5 %   Platelets 166.0 150.0 - 400.0 K/uL   Neutrophils Relative % 57.8 43.0 - 77.0 %   Lymphocytes Relative 28.5 12.0 - 46.0 %   Monocytes Relative 9.8 3.0 - 12.0 %   Eosinophils Relative 3.0 0.0 - 5.0 %   Basophils Relative 0.9 0.0 - 3.0 %   Neutro Abs 3.7 1.4 - 7.7 K/uL   Lymphs Abs 1.8 0.7 - 4.0 K/uL   Monocytes Absolute 0.6 0.1 - 1.0 K/uL   Eosinophils Absolute 0.2 0.0 - 0.7 K/uL   Basophils Absolute 0.1 0.0 - 0.1 K/uL  Comprehensive metabolic panel  Result Value Ref Range   Sodium 138 135 - 145 mEq/L   Potassium 3.9 3.5 - 5.1 mEq/L   Chloride 102 96 - 112 mEq/L   CO2 26 19 - 32 mEq/L   Glucose, Bld 101 (H) 70 - 99 mg/dL   BUN 12 6 - 23 mg/dL   Creatinine, Ser 0.84 0.40 - 1.50 mg/dL   Total Bilirubin 0.7 0.2 - 1.2 mg/dL   Alkaline Phosphatase 65 39 - 117 U/L   AST 25 0 - 37 U/L   ALT 31 0 - 53 U/L   Total Protein 7.1 6.0 - 8.3 g/dL   Albumin 4.4 3.5 - 5.2 g/dL   GFR 96.51 >60.00 mL/min  Calcium 9.2 8.4 - 10.5 mg/dL   Sedimentation rate  Result Value Ref Range   Sed Rate 13 0 - 20 mm/hr  Lipase  Result Value Ref Range   Lipase 22.0 11.0 - 59.0 U/L  POCT Urinalysis Dipstick (Automated)  Result Value Ref Range   Color, UA yellow    Clarity, UA clear    Glucose, UA Positive (A) Negative   Bilirubin, UA negative    Ketones, UA 1+    Spec Grav, UA 1.015 1.010 - 1.025   Blood, UA negative    pH, UA 5.5 5.0 - 8.0   Protein, UA Negative Negative   Urobilinogen, UA 0.2 0.2 or 1.0 E.U./dL   Nitrite, UA negative    Leukocytes, UA Negative Negative    Assessment & Plan:   Problem List Items Addressed This Visit     Left flank pain - Primary    Story suspicious for shingles however no blistering rash as of yet, now 5 days into symptom onset.  Abdominal exam overall reassuring to deep palpation, skin is more painful to light touch.  Reviewed OTC NSAID/tylenol use, add tramadol.  [facility] Rx printed for valtrex with indications when to fill (ie development of typical shingles rash). UA negative for blood, KUB without obvious stone.  Check labs today.  Discussed possible CT abd/pelvis if no improvement in 24-48 hours.       Relevant Orders   POCT Urinalysis Dipstick (Automated) (Completed)   CBC with Differential/Platelet (Completed)   Comprehensive metabolic panel (Completed)   Sedimentation rate (Completed)   Lipase (Completed)   DG Abd 1 View (Completed)   RESOLVED: Left sided abdominal pain   Relevant Orders   CBC with Differential/Platelet (Completed)   Comprehensive metabolic panel (Completed)   Sedimentation rate (Completed)   Lipase (Completed)   DG Abd 1 View (Completed)   Other Visit Diagnoses     Left upper quadrant abdominal swelling, mass and lump            Meds ordered this encounter  Medications   traMADol (ULTRAM) 50 MG tablet    Sig: Take 1 tablet (50 mg total) by mouth every 8 (eight) hours as needed for up to 5 days for severe pain (breakthrough).    Dispense:  15  tablet    Refill:  0   valACYclovir (VALTREX) 1000 MG tablet    Sig: Take 1 tablet (1,000 mg total) by mouth 3 (three) times daily.    Dispense:  21 tablet    Refill:  0   Orders Placed This Encounter  Procedures   DG Abd 1 View    Standing Status:   Future    Number of Occurrences:   1    Standing Expiration Date:   01/29/2023    Order Specific Question:   Reason for Exam (SYMPTOM  OR DIAGNOSIS REQUIRED)    Answer:   L flank and left side pain    Order Specific Question:   Preferred imaging location?    Answer:   [facility]   CBC with Differential/Platelet   Comprehensive metabolic panel   Sedimentation rate   Lipase   POCT Urinalysis Dipstick (Automated)    Patient Instructions  Possible shingles - continue ibuprofen '800mg'$  three times a day with meals, may continue tylenol 2 tablets 3 times a day as well for next few days. Add tramadol for breakthrough pain '50mg'$  up to 3 a day as needed.  If blistering rash on left side of  body develops, start antiviral printed out today.  Labs today  Urine didn't show blood or infection.  If ongoing pain not improved with above, and no rash develops, let me know for CT abdomen/pelvis.   Follow up plan: Return if symptoms worsen or fail to improve.  Ria Bush, MD

## 2022-01-28 NOTE — Assessment & Plan Note (Signed)
Story suspicious for shingles however no blistering rash as of yet, now 5 days into symptom onset.  Abdominal exam overall reassuring to deep palpation, skin is more painful to light touch.  Reviewed OTC NSAID/tylenol use, add tramadol.  [facility] Rx printed for valtrex with indications when to fill (ie development of typical shingles rash). UA negative for blood, KUB without obvious stone.  Check labs today.  Discussed possible CT abd/pelvis if no improvement in 24-48 hours.

## 2022-01-30 ENCOUNTER — Encounter: Payer: Self-pay | Admitting: Family Medicine

## 2022-01-30 DIAGNOSIS — R1904 Left lower quadrant abdominal swelling, mass and lump: Secondary | ICD-10-CM

## 2022-01-30 DIAGNOSIS — R1012 Left upper quadrant pain: Secondary | ICD-10-CM

## 2022-01-31 ENCOUNTER — Telehealth: Payer: Self-pay

## 2022-01-31 MED ORDER — LIDOCAINE 5 % EX PTCH: 1.0000 | MEDICATED_PATCH | CUTANEOUS | 0 refills | Status: DC

## 2022-01-31 NOTE — Telephone Encounter (Signed)
Prior auth started for Lidocaine 5% patches. [address] Key: [id number] - Rx #: [id number] Waiting for determination.

## 2022-01-31 NOTE — Telephone Encounter (Signed)
Ordering stat CT abd/pelvis for LUQ\L flank pain.

## 2022-01-31 NOTE — Addendum Note (Signed)
Addended by: Ria Bush on: 01/31/2022 04:04 PM   Modules accepted: Orders

## 2022-01-31 NOTE — Addendum Note (Signed)
Addended by: Ria Bush on: 01/31/2022 02:04 PM   Modules accepted: Orders

## 2022-01-31 NOTE — Addendum Note (Signed)
Addended by: Ria Bush on: 01/31/2022 03:12 PM   Modules accepted: Orders

## 2022-01-31 NOTE — Telephone Encounter (Signed)
Changed to CT abdomen because insurance didn't approve abd/pelvis.

## 2022-02-03 ENCOUNTER — Ambulatory Visit
Admission: RE | Admit: 2022-02-03 | Discharge: 2022-02-03 | Disposition: A | Discharge status: Home / Self Care | Payer: BC Managed Care – PPO | Source: Ambulatory Visit | Attending: Family Medicine | Admitting: Family Medicine

## 2022-02-03 ENCOUNTER — Ambulatory Visit: Payer: BC Managed Care – PPO

## 2022-02-03 ENCOUNTER — Ambulatory Visit: Admission: RE | Admit: 2022-02-03 | Payer: BC Managed Care – PPO | Source: Ambulatory Visit

## 2022-02-03 DIAGNOSIS — R1904 Left lower quadrant abdominal swelling, mass and lump: Secondary | ICD-10-CM | POA: Insufficient documentation

## 2022-02-03 DIAGNOSIS — R1012 Left upper quadrant pain: Secondary | ICD-10-CM | POA: Diagnosis not present

## 2022-02-03 NOTE — Telephone Encounter (Signed)
Prior auth for Lidocaine 5% patches has been denied. [address] Key: [id number] - Rx #: [id number]  Denied today Your request has been denied.  No reason given on Cover My Meds.  Waiting for faxed response for reason of denial.

## 2022-02-04 ENCOUNTER — Encounter: Payer: Self-pay | Admitting: Family Medicine

## 2022-02-11 NOTE — Telephone Encounter (Signed)
Denied - "request does not meet definition of Medical Necessity found in benefit booklet"

## 2022-02-24 ENCOUNTER — Emergency Department (HOSPITAL_COMMUNITY)
Admission: EM | Admit: 2022-02-24 | Discharge: 2022-02-24 | Disposition: A | Discharge status: Home / Self Care | Payer: BC Managed Care – PPO | Attending: Emergency Medicine | Admitting: Emergency Medicine

## 2022-02-24 ENCOUNTER — Other Ambulatory Visit: Payer: Self-pay

## 2022-02-24 ENCOUNTER — Encounter (HOSPITAL_COMMUNITY): Payer: Self-pay

## 2022-02-24 ENCOUNTER — Emergency Department (HOSPITAL_COMMUNITY): Payer: BC Managed Care – PPO

## 2022-02-24 DIAGNOSIS — S80912A Unspecified superficial injury of left knee, initial encounter: Secondary | ICD-10-CM | POA: Diagnosis not present

## 2022-02-24 DIAGNOSIS — Z79899 Other long term (current) drug therapy: Secondary | ICD-10-CM | POA: Insufficient documentation

## 2022-02-24 DIAGNOSIS — Z8616 Personal history of COVID-19: Secondary | ICD-10-CM | POA: Diagnosis not present

## 2022-02-24 DIAGNOSIS — Y9301 Activity, walking, marching and hiking: Secondary | ICD-10-CM | POA: Insufficient documentation

## 2022-02-24 DIAGNOSIS — I10 Essential (primary) hypertension: Secondary | ICD-10-CM | POA: Insufficient documentation

## 2022-02-24 DIAGNOSIS — Z7982 Long term (current) use of aspirin: Secondary | ICD-10-CM | POA: Diagnosis not present

## 2022-02-24 DIAGNOSIS — X58XXXA Exposure to other specified factors, initial encounter: Secondary | ICD-10-CM | POA: Diagnosis not present

## 2022-02-24 DIAGNOSIS — E119 Type 2 diabetes mellitus without complications: Secondary | ICD-10-CM | POA: Insufficient documentation

## 2022-02-24 DIAGNOSIS — S8392XA Sprain of unspecified site of left knee, initial encounter: Secondary | ICD-10-CM | POA: Diagnosis not present

## 2022-02-24 DIAGNOSIS — M25562 Pain in left knee: Secondary | ICD-10-CM | POA: Diagnosis not present

## 2022-02-24 MED ORDER — HYDROCODONE-ACETAMINOPHEN 5-325 MG PO TABS: 2.0000 | ORAL_TABLET | ORAL | 0 refills | Status: DC | PRN

## 2022-02-24 MED ORDER — PREDNISONE 10 MG (21) PO TBPK: ORAL_TABLET | Freq: Every day | ORAL | 0 refills | Status: DC

## 2022-02-24 NOTE — Progress Notes (Signed)
Orthopedic Tech Progress Note Patient Details:  Joel Bennett 06/14/1965 [id number]  Ortho Devices Type of Ortho Device: Knee Immobilizer Ortho Device/Splint Location: LLE Ortho Device/Splint Interventions: Application, Ordered, Adjustment   Post Interventions Patient Tolerated: Well Instructions Provided: Adjustment of device  Joel Bennett Joel Bennett 02/24/2022, 12:43 PM

## 2022-02-24 NOTE — ED Provider Notes (Signed)
[facility] EMERGENCY DEPARTMENT Provider Note   CSN: [id number] Arrival date & time: 02/24/22  0900     History  No chief complaint on file.   Joel Bennett is a 56 y.o. male.  56 year old male presents with left knee pain which began after he felt a pop in his left knee walking on the stairs.  Has had swelling since that time.  Denies any fever or chills.  Does have a history of seronegative rheumatoid arthritis in the past.  Patient has been using crutches and ice with limited relief.  Denies any hip pain.      Past Medical History:  Diagnosis Date   Allergic rhinitis    Allergy    COVID-19 virus infection 01/2020   s/p mAb infusion   DDD (degenerative disc disease), lumbar    Diabetes mellitus without complication ([place])    Diabetes type 2, uncontrolled 2012   Gout    History of depression 2000   treated with meds   Hypertension 1994   Seronegative rheumatoid arthritis ([place]) 01/2019   MTX started 01/2019 - sees [place] rheum Marella Chimes)   Stool incontinence 12/2011   neg giardia, neg O&P, neg iFOB from prior PCP    Home Medications Prior to Admission medications   Medication Sig Start Date End Date Taking? Authorizing Provider  amLODipine (NORVASC) 5 MG tablet Take 1 tablet (5 mg total) by mouth daily. 06/17/21   Ria Bush, MD  aspirin EC 81 MG tablet Take 1 tablet (81 mg total) by mouth daily. 02/04/19   Ria Bush, MD  atorvastatin (LIPITOR) 40 MG tablet Take 1 tablet (40 mg total) by mouth daily. 06/17/21   Ria Bush, MD  colchicine 0.6 MG tablet TAKE ONE TABLET BY MOUTH TWICE A DAY 10/23/21   Ria Bush, MD  empagliflozin (JARDIANCE) 10 MG TABS tablet Take 1 tablet (10 mg total) by mouth daily. 11/19/21   Ria Bush, MD  glimepiride (AMARYL) 2 MG tablet Take 1 tablet (2 mg total) by mouth daily before breakfast. 06/17/21   Ria Bush, MD  glucose blood (FREESTYLE LITE) test strip Use as instructed to check  sugars once daily and as needed E11.40 11/19/21   Ria Bush, MD  indomethacin (INDOCIN) 50 MG capsule Take 1 capsule (50 mg total) by mouth 3 (three) times daily as needed. 08/09/21   Ria Bush, MD  lidocaine (LIDODERM) 5 % Place 1 patch onto the skin daily. Remove & Discard patch within 12 hours or as directed by MD 01/31/22   Ria Bush, MD  olmesartan (BENICAR) 40 MG tablet Take 1 tablet (40 mg total) by mouth daily. 06/17/21   Ria Bush, MD  pioglitazone (ACTOS) 15 MG tablet Take 1 tablet (15 mg total) by mouth daily. 11/19/21   Ria Bush, MD  valACYclovir (VALTREX) 1000 MG tablet Take 1 tablet (1,000 mg total) by mouth 3 (three) times daily. 01/28/22   Ria Bush, MD  vitamin B-12 (CYANOCOBALAMIN) 1000 MCG tablet Take 1 tablet (1,000 mcg total) by mouth daily. 11/03/20   Ria Bush, MD      Allergies    Glucophage xr [metformin hcl er] and Ozempic (0.25 or 0.5 mg-dose) [semaglutide(0.25 or 0.'5mg'$ -dos)]    Review of Systems   Review of Systems  All other systems reviewed and are negative.   Physical Exam Updated Vital Signs BP 123/89   Pulse 85   Temp 98.9 F (37.2 C) (Oral)   Resp 18   SpO2 92%  Physical  Exam Vitals and nursing note reviewed.  Constitutional:      General: He is not in acute distress.    Appearance: Normal appearance. He is well-developed. He is not toxic-appearing.  HENT:     Head: Normocephalic and atraumatic.  Eyes:     General: Lids are normal.     Conjunctiva/sclera: Conjunctivae normal.     Pupils: Pupils are equal, round, and reactive to light.  Neck:     Thyroid: No thyroid mass.     Trachea: No tracheal deviation.  Cardiovascular:     Rate and Rhythm: Normal rate and regular rhythm.     Heart sounds: Normal heart sounds. No murmur heard.    No gallop.  Pulmonary:     Effort: Pulmonary effort is normal. No respiratory distress.     Breath sounds: Normal breath sounds. No stridor. No decreased  breath sounds, wheezing, rhonchi or rales.  Abdominal:     General: There is no distension.     Palpations: Abdomen is soft.     Tenderness: There is no abdominal tenderness. There is no rebound.  Musculoskeletal:     Cervical back: Normal range of motion and neck supple.     Left knee: Decreased range of motion. Tenderness present.  Skin:    General: Skin is warm and dry.     Findings: No abrasion or rash.  Neurological:     Mental Status: He is alert and oriented to person, place, and time. Mental status is at baseline.     GCS: GCS eye subscore is 4. GCS verbal subscore is 5. GCS motor subscore is 6.     Cranial Nerves: No cranial nerve deficit.     Sensory: No sensory deficit.     Motor: Motor function is intact.  Psychiatric:        Attention and Perception: Attention normal.        Speech: Speech normal.        Behavior: Behavior normal.     ED Results / Procedures / Treatments   Labs (all labs ordered are listed, but only abnormal results are displayed) Labs Reviewed - No data to display  EKG None  Radiology DG Knee Complete 4 Views Left  Result Date: 02/24/2022 CLINICAL DATA:  Left knee pain. Felt a pop in left knee 3 days earlier with locking on steps. EXAM: LEFT KNEE - COMPLETE 4+ VIEW COMPARISON:  None Available. FINDINGS: Moderate to large knee joint effusion. Minimal chronic enthesopathic change at the proximal patellar tendon origin. Mild medial and lateral compartment chondrocalcinosis. Joint spaces are preserved. No acute fracture or dislocation. IMPRESSION: 1. Moderate to large knee joint effusion. 2. Mild medial and lateral compartment chondrocalcinosis. Electronically Signed   By: Yvonne Kendall M.D.   On: 02/24/2022 09:50    Procedures Procedures    Medications Ordered in ED Medications - No data to display  ED Course/ Medical Decision Making/ A&P                           Medical Decision Making Amount and/or Complexity of Data Reviewed Radiology:  ordered.   X-ray of left knee shows no evidence of fracture.  Does have an effusion which I suspect is traumatic.  He does have a history of arthritis in the past.  I will place patient on short course of prednisone and he will be given orthopedic referral.        Final Clinical Impression(s) /  ED Diagnoses Final diagnoses:  None    Rx / DC Orders ED Discharge Orders     None         Lacretia Leigh, MD 02/24/22 1140

## 2022-02-24 NOTE — ED Triage Notes (Signed)
Patient complains of left knee pain, arriving using crutches. Reports that he felt a pop on Friday night while walking down steps. Pain with any ROM.

## 2022-03-07 DIAGNOSIS — M2392 Unspecified internal derangement of left knee: Secondary | ICD-10-CM | POA: Diagnosis not present

## 2022-03-07 DIAGNOSIS — M1712 Unilateral primary osteoarthritis, left knee: Secondary | ICD-10-CM | POA: Diagnosis not present

## 2022-03-28 ENCOUNTER — Ambulatory Visit: Payer: BC Managed Care – PPO | Admitting: Nurse Practitioner

## 2022-03-28 ENCOUNTER — Encounter: Payer: Self-pay | Admitting: Nurse Practitioner

## 2022-03-28 VITALS — BP 136/86 | HR 85 | Ht 69.25 in | Wt 269.0 lb

## 2022-03-28 DIAGNOSIS — H6502 Acute serous otitis media, left ear: Secondary | ICD-10-CM

## 2022-03-28 DIAGNOSIS — J0100 Acute maxillary sinusitis, unspecified: Secondary | ICD-10-CM

## 2022-03-28 DIAGNOSIS — H6523 Chronic serous otitis media, bilateral: Secondary | ICD-10-CM | POA: Insufficient documentation

## 2022-03-28 DIAGNOSIS — J3489 Other specified disorders of nose and nasal sinuses: Secondary | ICD-10-CM

## 2022-03-28 MED ORDER — AMOXICILLIN-POT CLAVULANATE 875-125 MG PO TABS: 1.0000 | ORAL_TABLET | Freq: Two times a day (BID) | ORAL | 0 refills | Status: AC

## 2022-03-28 MED ORDER — FLUTICASONE PROPIONATE 50 MCG/ACT NA SUSP: 2.0000 | Freq: Every day | NASAL | 0 refills | Status: DC

## 2022-03-28 NOTE — Assessment & Plan Note (Signed)
Fluticasone spray 2 sprays each nostril as directed.

## 2022-03-28 NOTE — Assessment & Plan Note (Signed)
TM intact.  Will treat with Augmentin 875-125 mg twice daily for 10 days.  Strict instructions to return to clinic if he does not improve or hearing does not improve.

## 2022-03-28 NOTE — Assessment & Plan Note (Signed)
Like to treat with Augmentin 875-125 mg twice daily for 10 days.  Patient is fluticasone nasal spray also to help with some sinus pressure.  Follow-up if no improvement

## 2022-03-28 NOTE — Progress Notes (Signed)
Acute Office Visit  Subjective:     Patient ID: Joel Bennett, male    DOB: 1965/04/17, 57 y.o.   MRN: [id number]  Chief Complaint  Patient presents with   Ear Pain    Started yesterday has taken tylenol   URI    Couple weeks, has tried mucinex and allergy medicine    URI  Associated symptoms include ear pain, headaches (pressure), sinus pain and a sore throat (scracthy). Pertinent negatives include no abdominal pain, coughing, diarrhea, joint pain, nausea or vomiting.   Patient is in today for multiple complaints with a history of CAD, HTN, DM2, PMR  Symptoms started a couple weeks ago States that his wife has been sick Covid test at home that negative States that he has been using over the counter allergy and mucinex, and tylenol. States that he thought he was getting better and Monday he started get to get worse  States the ear started yesterdy with pain and blood  Covid vaccine: not up to date Flu vaccine: not up to date  Review of Systems  Constitutional:  Positive for malaise/fatigue. Negative for chills and fever.       Appetite some decreased Fluid intake good   HENT:  Positive for ear pain, sinus pain and sore throat (scracthy).   Respiratory:  Negative for cough, sputum production and shortness of breath.   Gastrointestinal:  Negative for abdominal pain, diarrhea, nausea and vomiting.  Musculoskeletal:  Negative for joint pain and myalgias.  Neurological:  Positive for headaches (pressure).        Objective:    BP 136/86   Pulse 85   Ht 5' 9.25" (1.759 m)   Wt 269 lb (122 kg)   SpO2 96%   BMI 39.44 kg/m    Physical Exam Vitals and nursing note reviewed.  Constitutional:      Appearance: Normal appearance.  HENT:     Right Ear: Tympanic membrane, ear canal and external ear normal.     Left Ear: External ear normal. Drainage present. No tenderness. Tympanic membrane is erythematous and bulging. Tympanic membrane is not perforated.     Nose:      Right Sinus: No maxillary sinus tenderness or frontal sinus tenderness.     Left Sinus: Maxillary sinus tenderness present. No frontal sinus tenderness.     Mouth/Throat:     Mouth: Mucous membranes are moist.     Pharynx: Oropharynx is clear.  Cardiovascular:     Rate and Rhythm: Normal rate and regular rhythm.     Heart sounds: Normal heart sounds.  Pulmonary:     Breath sounds: Normal breath sounds.  Neurological:     Mental Status: He is alert.     No results found for any visits on 03/28/22.      Assessment & Plan:   Problem List Items Addressed This Visit       Respiratory   Acute maxillary sinusitis    Like to treat with Augmentin 875-125 mg twice daily for 10 days.  Patient is fluticasone nasal spray also to help with some sinus pressure.  Follow-up if no improvement      Relevant Medications   amoxicillin-clavulanate (AUGMENTIN) 875-125 MG tablet   fluticasone (FLONASE) 50 MCG/ACT nasal spray     Nervous and Auditory   Acute serous otitis media of left ear - Primary    TM intact.  Will treat with Augmentin 875-125 mg twice daily for 10 days.  Strict instructions to return to  clinic if he does not improve or hearing does not improve.      Relevant Medications   amoxicillin-clavulanate (AUGMENTIN) 875-125 MG tablet     Other   Sinus pressure    Fluticasone spray 2 sprays each nostril as directed.      Relevant Medications   fluticasone (FLONASE) 50 MCG/ACT nasal spray    Meds ordered this encounter  Medications   amoxicillin-clavulanate (AUGMENTIN) 875-125 MG tablet    Sig: Take 1 tablet by mouth 2 (two) times daily for 10 days.    Dispense:  20 tablet    Refill:  0    Order Specific Question:   Supervising Provider    Answer:   Glori Bickers MARNE A [[id number]]   fluticasone (FLONASE) 50 MCG/ACT nasal spray    Sig: Place 2 sprays into both nostrils daily.    Dispense:  16 g    Refill:  0    Order Specific Question:   Supervising Provider    Answer:   TOWER,  MARNE A [[id number]]    Return if symptoms worsen or fail to improve.  Romilda Garret, NP

## 2022-03-28 NOTE — Patient Instructions (Signed)
Nice to see you today I have sent in a nasal spray and an antibiotic If you ear does not start improving over the next week please be seen in clinico

## 2022-04-08 ENCOUNTER — Encounter: Payer: Self-pay | Admitting: Family Medicine

## 2022-04-08 ENCOUNTER — Ambulatory Visit: Payer: BC Managed Care – PPO | Admitting: Family Medicine

## 2022-04-08 VITALS — BP 126/72 | HR 69 | Temp 97.6°F | Ht 69.25 in | Wt 273.0 lb

## 2022-04-08 DIAGNOSIS — E1140 Type 2 diabetes mellitus with diabetic neuropathy, unspecified: Secondary | ICD-10-CM

## 2022-04-08 DIAGNOSIS — H6502 Acute serous otitis media, left ear: Secondary | ICD-10-CM

## 2022-04-08 LAB — POCT GLYCOSYLATED HEMOGLOBIN (HGB A1C): Hemoglobin A1C: 7.4 % — AB (ref 4.0–5.6)

## 2022-04-08 MED ORDER — PREDNISONE 20 MG PO TABS: 40.0000 mg | ORAL_TABLET | Freq: Every day | ORAL | 0 refills | Status: DC

## 2022-04-08 NOTE — Patient Instructions (Addendum)
I think infection is better but you still have persistent fluid in ear with pressure difference between middle ear and sinuses.  Treat with prednisone burst for 4 days.  Continue flonase nasal steroid.  After steroid (prednisone), could use ibuprofen 400-'600mg'$  for discomfort.  Let us know if worsening pain or fever develops for further antibiotic. Let us know if ongoing muffled hearing and ringing past 3-4 weeks for ENT evaluation.  A1c today.

## 2022-04-08 NOTE — Progress Notes (Signed)
Patient ID: Joel Bennett, male    DOB: November 18, 1965, 57 y.o.   MRN: [id number]  This visit was conducted in person.  BP 126/72   Pulse 69   Temp 97.6 F (36.4 C) (Temporal)   Ht 5' 9.25" (1.759 m)   Wt 273 lb (123.8 kg)   SpO2 93%   BMI 40.02 kg/m    CC: check L ear Subjective:   HPI: Joel Bennett is a 57 y.o. male presenting on 04/08/2022 for Ear Pain (C/o ongoing L ear pain, tinnitus and off balance. Seen previously for same sxs. )   See last week by Romilda Garret treated for acute serous otitis of left ear with augmentin 10d course + flonase.   Yesterday finished antibiotics with decreased pain however notes intermittent stabbing pain to left ear as well as worsening high pitched tinnitus. Notes decreased hearing as well. He continues flonase antibiotic.   No fevers/chills, tooth pain, cough or congestion - overall these symptoms are better.   DM - on jardiance '10mg'$  daily, amaryl '2mg'$  daily, and actos '15mg'$  daily. Cbg's 120-130s at home.  Lab Results  Component Value Date   HGBA1C 7.4 (A) 04/08/2022        Relevant past medical, surgical, family and social history reviewed and updated as indicated. Interim medical history since our last visit reviewed. Allergies and medications reviewed and updated. Outpatient Medications Prior to Visit  Medication Sig Dispense Refill   amLODipine (NORVASC) 5 MG tablet Take 1 tablet (5 mg total) by mouth daily. 90 tablet 3   aspirin EC 81 MG tablet Take 1 tablet (81 mg total) by mouth daily.     atorvastatin (LIPITOR) 40 MG tablet Take 1 tablet (40 mg total) by mouth daily. 90 tablet 3   colchicine 0.6 MG tablet TAKE ONE TABLET BY MOUTH TWICE A DAY 60 tablet 1   empagliflozin (JARDIANCE) 10 MG TABS tablet Take 1 tablet (10 mg total) by mouth daily. 30 tablet 6   fluticasone (FLONASE) 50 MCG/ACT nasal spray Place 2 sprays into both nostrils daily. 16 g 0   glimepiride (AMARYL) 2 MG tablet Take 1 tablet (2 mg total) by mouth daily before  breakfast. 90 tablet 3   glucose blood (FREESTYLE LITE) test strip Use as instructed to check sugars once daily and as needed E11.40 100 each 3   indomethacin (INDOCIN) 50 MG capsule Take 1 capsule (50 mg total) by mouth 3 (three) times daily as needed. 30 capsule 0   olmesartan (BENICAR) 40 MG tablet Take 1 tablet (40 mg total) by mouth daily. 90 tablet 3   pioglitazone (ACTOS) 15 MG tablet Take 1 tablet (15 mg total) by mouth daily. 90 tablet 3   vitamin B-12 (CYANOCOBALAMIN) 1000 MCG tablet Take 1 tablet (1,000 mcg total) by mouth daily.     lidocaine (LIDODERM) 5 % Place 1 patch onto the skin daily. Remove & Discard patch within 12 hours or as directed by MD 15 patch 0   No facility-administered medications prior to visit.     Per HPI unless specifically indicated in ROS section below Review of Systems  Objective:  BP 126/72   Pulse 69   Temp 97.6 F (36.4 C) (Temporal)   Ht 5' 9.25" (1.759 m)   Wt 273 lb (123.8 kg)   SpO2 93%   BMI 40.02 kg/m   Wt Readings from Last 3 Encounters:  04/08/22 273 lb (123.8 kg)  03/28/22 269 lb (122 kg)  01/28/22 281 lb (  127.5 kg)      Physical Exam Vitals and nursing note reviewed.  Constitutional:      Appearance: Normal appearance. He is not ill-appearing.  HENT:     Head: Normocephalic and atraumatic.     Right Ear: Hearing, tympanic membrane, ear canal and external ear normal. There is no impacted cerumen. Tympanic membrane is not erythematous or bulging.     Left Ear: Ear canal and external ear normal. Decreased hearing noted. There is no impacted cerumen. Tympanic membrane is erythematous. Tympanic membrane is not bulging.     Ears:     Comments: Fluid behind L TM with mild erythema to superior half without bulging    Nose: Mucosal edema and congestion present. No rhinorrhea.     Right Turbinates: Not enlarged, swollen or pale.     Left Turbinates: Not enlarged, swollen or pale.     Right Sinus: No maxillary sinus tenderness or  frontal sinus tenderness.     Left Sinus: No maxillary sinus tenderness or frontal sinus tenderness.     Comments: Nasopharyngeal crowding    Mouth/Throat:     Mouth: Mucous membranes are moist.     Pharynx: Oropharynx is clear. No oropharyngeal exudate or posterior oropharyngeal erythema.  Eyes:     Extraocular Movements: Extraocular movements intact.     Conjunctiva/sclera: Conjunctivae normal.     Pupils: Pupils are equal, round, and reactive to light.  Cardiovascular:     Rate and Rhythm: Normal rate and regular rhythm.     Pulses: Normal pulses.     Heart sounds: Normal heart sounds. No murmur heard. Pulmonary:     Effort: Pulmonary effort is normal. No respiratory distress.     Breath sounds: Normal breath sounds. No wheezing, rhonchi or rales.  Musculoskeletal:     Cervical back: Normal range of motion and neck supple. No rigidity.     Right lower leg: No edema.     Left lower leg: No edema.  Lymphadenopathy:     Cervical: No cervical adenopathy.  Skin:    General: Skin is warm and dry.     Findings: No rash.  Neurological:     Mental Status: He is alert.  Psychiatric:        Mood and Affect: Mood normal.        Behavior: Behavior normal.       Results for orders placed or performed in visit on 04/08/22  POCT glycosylated hemoglobin (Hb A1C)  Result Value Ref Range   Hemoglobin A1C 7.4 (A) 4.0 - 5.6 %   HbA1c POC (<> result, manual entry)     HbA1c, POC (prediabetic range)     HbA1c, POC (controlled diabetic range)      Assessment & Plan:   Problem List Items Addressed This Visit     Type 2 diabetes mellitus with diabetic neuropathy, unspecified ([place])    Continues jardiance ,amaryl, actos with overall good control at home. Update A1c today.       Relevant Orders   POCT glycosylated hemoglobin (Hb A1C) (Completed)   Acute serous otitis media of left ear - Primary    He completed augmentin 10d antibiotic course with overall improvement in symptoms however  with persistent tinnitus, muffled hearing, intermittent ear discomfort. Anticipate infection is better however with persistent inflammation and fluid behind ear despite regular flonase use. Will Rx prednisone burst x4 days, lower dose in diabetic, reviewed customary steroid precautions. Supportive measures reviewed. Update if ongoing symptoms past 3-4 weeks  to refer to ENT for evaluation.         Meds ordered this encounter  Medications   predniSONE (DELTASONE) 20 MG tablet    Sig: Take 2 tablets (40 mg total) by mouth daily with breakfast.    Dispense:  8 tablet    Refill:  0    Orders Placed This Encounter  Procedures   POCT glycosylated hemoglobin (Hb A1C)    Patient Instructions  I think infection is better but you still have persistent fluid in ear with pressure difference between middle ear and sinuses.  Treat with prednisone burst for 4 days.  Continue flonase nasal steroid.  After steroid (prednisone), could use ibuprofen 400-'600mg'$  for discomfort.  Let us know if worsening pain or fever develops for further antibiotic. Let us know if ongoing muffled hearing and ringing past 3-4 weeks for ENT evaluation.  A1c today.   Follow up plan: Return if symptoms worsen or fail to improve.  Ria Bush, MD

## 2022-04-09 ENCOUNTER — Encounter: Payer: Self-pay | Admitting: Family Medicine

## 2022-04-09 NOTE — Assessment & Plan Note (Addendum)
He completed augmentin 10d antibiotic course with overall improvement in symptoms however with persistent tinnitus, muffled hearing, intermittent ear discomfort. Anticipate infection is better however with persistent inflammation and fluid behind ear despite regular flonase use. Will Rx prednisone burst x4 days, lower dose in diabetic, reviewed customary steroid precautions. Supportive measures reviewed. Update if ongoing symptoms past 3-4 weeks to refer to ENT for evaluation.

## 2022-04-09 NOTE — Assessment & Plan Note (Signed)
Continues jardiance ,amaryl, actos with overall good control at home. Update A1c today.

## 2022-04-23 ENCOUNTER — Encounter: Payer: Self-pay | Admitting: Family Medicine

## 2022-04-23 DIAGNOSIS — H9192 Unspecified hearing loss, left ear: Secondary | ICD-10-CM

## 2022-04-23 DIAGNOSIS — H6502 Acute serous otitis media, left ear: Secondary | ICD-10-CM

## 2022-04-23 DIAGNOSIS — H9202 Otalgia, left ear: Secondary | ICD-10-CM

## 2022-05-13 NOTE — Addendum Note (Signed)
Addended by: Ria Bush on: 05/13/2022 08:47 AM   Modules accepted: Orders

## 2022-05-27 NOTE — Progress Notes (Signed)
Revonda Menter T. Callen Vancuren, MD, [facility] [place], [address]  Phone: [phone]  FAX: [phone]  [name] - 57 y.o. male  MRN [id number]  Date of Birth: 09/12/1965  Date: 05/28/2022  PCP: Ria Bush, MD  Referral: Ria Bush, MD  Chief Complaint  Patient presents with   Knee Pain    Bilateral  (Patient states he was prescribed prednisone 20 mg at ENT today)   Hand Pain    Bilateral    Numbness    Left and at night when he lays down both legs feel like they are on fire for a few mintues   Subjective:   Joel Bennett is a 57 y.o. very pleasant male patient with Body mass index is 39.95 kg/m. who presents with the following:  He is a pleasant gentleman who I recall quite well, and he presents with some hand knee and leg pain.  B hand OA.  He has diffuse hand osteoarthritis with pain in his PIP joints, DIP joints to a greater degree, as well as bilateral basal joints.  He has had a left-sided what appears to be a basal joint replacement.  He also has bilateral knee pain has been longstanding off and on for years, but his left knee is somewhat more affected right now.  He does have a mild effusion.  Does have pain with weightbearing and with walking and putting stress on the knee.  No locking up of the joint.  No no symptomatic giving way.  No history of left-sided knee surgery, though he does have a remote history of right-sided knee surgery to some degree in the 1980s.  Went to ENT, placed on prednisone.  Pred 20 mg x 10 days.  He has not started this today.  L lateral femoral cutaneous numbness.  This is on the left upper thigh region.  Anterior. - for about 2 mins at night legs will feel like they are on fire.  This never last longer than this.  Lab Results  Component Value Date   HGBA1C 7.4 (A) 04/08/2022      Review of Systems is noted in the HPI, as  appropriate  Objective:   BP 124/76   Pulse 71   Temp 98 F (36.7 C) (Temporal)   Ht 5' 9.25" (1.759 m)   Wt 272 lb 8 oz (123.6 kg)   SpO2 97%   BMI 39.95 kg/m   GEN: No acute distress; alert,appropriate. PULM: Breathing comfortably in no respiratory distress PSYCH: Normally interactive.    Knee:  B Gait: Normal heel toe pattern ROM: 0-125 Effusion: neg Echymosis or edema: none Patellar tendon NT Painful PLICA: neg Patellar grind: negative Medial and lateral patellar facet loading: negative medial and lateral joint lines:NT Mcmurray's neg Flexion-pinch some pain with terminal flexion Varus and valgus stress: stable Lachman: neg Ant and Post drawer: neg Hip abduction, IR, ER: WNL Hip flexion str: 5/5 Hip abd: 5/5 Quad: 5/5 VMO atrophy:No Hamstring concentric and eccentric: 5/5   Bilateral hands with diffuse arthritic changes at multiple joints including the PIP, DIP, and MCP joints.  He does not have any bogginess, but he does have obvious Heberden's nodes.  He also has some tenderness at the basal joints, predominantly on the right side.  He has had prior basal joint surgery on the left.  He does have some altered sensation and mild numbness in the anterior lateral left thigh.  Laboratory and Imaging  Data:  Assessment and Plan:     ICD-10-CM   1. Arthritis, multiple joint involvement  M12.9     2. 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 Bilateral chronic knee pain  M25.561    M25.562    G89.29     4. Type 2 diabetes mellitus with diabetic neuropathy, without long-term current use of insulin (HCC)  E11.40     5. Arthritis of both hands  M19.041    M19.042      He has obvious hand arthritis, acute on chronic with exacerbation.  There are multiple joints throughout both hands that are symptomatic and have obvious physical changes of arthritis.  Bilateral chronic knee pain.  I reviewed his most recent knee x-ray from 3 months  ago, and he has preserved joint spaces.  Does have some chondrocalcinosis.  He may have a minor amount of arthritis, and his knee exam is relatively stable and reassuring today.  Does have some numbness in the distribution of the lateral femoral cutaneous nerve, consistent with meralgia paresthetica.  I encouraged him to lose weight and explained the anatomical location.  His ENT is just given him a round of some prednisone to take, I think that that is a good idea for his multijoint involvement.  I am also can give him some Celebrex to take after his prednisone is complete.  Medication Management during today's office visit: Meds ordered this encounter  Medications   celecoxib (CELEBREX) 200 MG capsule    Sig: Take 1 capsule (200 mg total) by mouth daily.    Dispense:  30 capsule    Refill:  2   Medications Discontinued During This Encounter  Medication Reason   predniSONE (DELTASONE) 20 MG tablet Completed Course    Orders placed today for conditions managed today: No orders of the defined types were placed in this encounter.   Disposition: No follow-ups on file.  [employer] One speech-to-text software was used for transcription in this dictation.  Possible transcriptional errors can occur using Editor, commissioning.   Signed,  Maud Deed. Arlington Sigmund, MD   Outpatient Encounter Medications as of 05/28/2022  Medication Sig   amLODipine (NORVASC) 5 MG tablet Take 1 tablet (5 mg total) by mouth daily.   aspirin EC 81 MG tablet Take 1 tablet (81 mg total) by mouth daily.   atorvastatin (LIPITOR) 40 MG tablet Take 1 tablet (40 mg total) by mouth daily.   celecoxib (CELEBREX) 200 MG capsule Take 1 capsule (200 mg total) by mouth daily.   colchicine 0.6 MG tablet TAKE ONE TABLET BY MOUTH TWICE A DAY   empagliflozin (JARDIANCE) 10 MG TABS tablet Take 1 tablet (10 mg total) by mouth daily.   fluticasone (FLONASE) 50 MCG/ACT nasal spray Place 2 sprays into both nostrils daily.   glimepiride  (AMARYL) 2 MG tablet Take 1 tablet (2 mg total) by mouth daily before breakfast.   glucose blood (FREESTYLE LITE) test strip Use as instructed to check sugars once daily and as needed E11.40   indomethacin (INDOCIN) 50 MG capsule Take 1 capsule (50 mg total) by mouth 3 (three) times daily as needed.   olmesartan (BENICAR) 40 MG tablet Take 1 tablet (40 mg total) by mouth daily.   pioglitazone (ACTOS) 15 MG tablet Take 1 tablet (15 mg total) by mouth daily.   vitamin B-12 (CYANOCOBALAMIN) 1000 MCG tablet Take 1 tablet (1,000 mcg total) by mouth daily.   [DISCONTINUED] predniSONE (DELTASONE) 20  MG tablet Take 2 tablets (40 mg total) by mouth daily with breakfast.   No facility-administered encounter medications on file as of 05/28/2022.

## 2022-05-28 ENCOUNTER — Ambulatory Visit: Payer: BC Managed Care – PPO | Admitting: Family Medicine

## 2022-05-28 ENCOUNTER — Encounter: Payer: Self-pay | Admitting: Family Medicine

## 2022-05-28 VITALS — BP 124/76 | HR 71 | Temp 98.0°F | Ht 69.25 in | Wt 272.5 lb

## 2022-05-28 DIAGNOSIS — M129 Arthropathy, unspecified: Secondary | ICD-10-CM | POA: Diagnosis not present

## 2022-05-28 DIAGNOSIS — H6502 Acute serous otitis media, left ear: Secondary | ICD-10-CM | POA: Diagnosis not present

## 2022-05-28 DIAGNOSIS — E1140 Type 2 diabetes mellitus with diabetic neuropathy, unspecified: Secondary | ICD-10-CM | POA: Diagnosis not present

## 2022-05-28 DIAGNOSIS — M25561 Pain in right knee: Secondary | ICD-10-CM | POA: Diagnosis not present

## 2022-05-28 DIAGNOSIS — G5712 Meralgia paresthetica, left lower limb: Secondary | ICD-10-CM | POA: Diagnosis not present

## 2022-05-28 DIAGNOSIS — M25562 Pain in left knee: Secondary | ICD-10-CM

## 2022-05-28 DIAGNOSIS — H6982 Other specified disorders of Eustachian tube, left ear: Secondary | ICD-10-CM | POA: Diagnosis not present

## 2022-05-28 DIAGNOSIS — M19041 Primary osteoarthritis, right hand: Secondary | ICD-10-CM

## 2022-05-28 DIAGNOSIS — G8929 Other chronic pain: Secondary | ICD-10-CM

## 2022-05-28 MED ORDER — CELECOXIB 200 MG PO CAPS: 200.0000 mg | ORAL_CAPSULE | Freq: Every day | ORAL | 2 refills | Status: DC

## 2022-06-15 ENCOUNTER — Other Ambulatory Visit: Payer: Self-pay | Admitting: Family Medicine

## 2022-06-15 DIAGNOSIS — E1140 Type 2 diabetes mellitus with diabetic neuropathy, unspecified: Secondary | ICD-10-CM

## 2022-06-17 NOTE — Telephone Encounter (Signed)
Noted  

## 2022-06-17 NOTE — Telephone Encounter (Signed)
E-scribed refill.  Plz schedule CPE and fasting lab visits for additional refills.

## 2022-06-17 NOTE — Telephone Encounter (Signed)
Patient has been scheduled

## 2022-06-30 DIAGNOSIS — H6983 Other specified disorders of Eustachian tube, bilateral: Secondary | ICD-10-CM | POA: Diagnosis not present

## 2022-06-30 DIAGNOSIS — H90A32 Mixed conductive and sensorineural hearing loss, unilateral, left ear with restricted hearing on the contralateral side: Secondary | ICD-10-CM | POA: Diagnosis not present

## 2022-06-30 DIAGNOSIS — H6123 Impacted cerumen, bilateral: Secondary | ICD-10-CM | POA: Diagnosis not present

## 2022-07-13 ENCOUNTER — Other Ambulatory Visit: Payer: Self-pay | Admitting: Family Medicine

## 2022-07-15 ENCOUNTER — Other Ambulatory Visit: Payer: Self-pay

## 2022-07-15 ENCOUNTER — Encounter: Payer: Self-pay | Admitting: Otolaryngology

## 2022-07-16 ENCOUNTER — Other Ambulatory Visit: Payer: Self-pay

## 2022-07-16 MED ORDER — CIPROFLOXACIN-DEXAMETHASONE 0.3-0.1 % OT SUSP
4.0000 [drp] | Freq: Two times a day (BID) | OTIC | 0 refills | Status: DC
  Filled 2022-07-16: qty 7.5, 19d supply, fill #0

## 2022-07-17 ENCOUNTER — Other Ambulatory Visit: Payer: Self-pay

## 2022-07-22 NOTE — Discharge Instructions (Signed)
[facility] DISCHARGE INSTRUCTIONS FOR MYRINGOTOMY AND TUBE INSERTION  [facility] CREIGHTON VAUGHT, M.D.   Diet:   After surgery, the patient should take only liquids and foods as tolerated.  The patient may then have a regular diet after the effects of anesthesia have worn off, usually about four to six hours after surgery.  Activities:   The patient should rest until the effects of anesthesia have worn off.  After this, there are no restrictions on the normal daily activities.  Medications:   You will be given a prescription for antibiotic drops to be used in the ears postoperatively.  It is recommended to use 4 drops 2 times a day for 4 days, then the drops should be saved for possible future use.  The tubes should not cause any discomfort to the patient, but if there is any question, Tylenol should be given according to the instructions for the age of the patient.  Other medications should be continued normally.  Precautions:   Should there be recurrent drainage after the tubes are placed, the drops should be used for approximately 3-4 days.  If it does not clear, you should call the ENT office.  Earplugs:   Earplugs are only needed for those who are going to be submerged under water.  When taking a bath or shower and using a cup or showerhead to rinse hair, it is not necessary to wear earplugs.  These come in a variety of fashions, all of which can be obtained at our office.  However, if one is not able to come by the office, then silicone plugs can be found at most pharmacies.  It is not advised to stick anything in the ear that is not approved as an earplug.  Silly putty is not to be used as an earplug.  Swimming is allowed in patients after ear tubes are inserted, however, they must wear earplugs if they are going to be submerged under water.  For those children who are going to be swimming a lot, it is recommended to use a fitted ear mold, which can be  made by our audiologist.  If discharge is noticed from the ears, this most likely represents an ear infection.  We would recommend getting your eardrops and using them as indicated above.  If it does not clear, then you should call the ENT office.  For follow up, the patient should return to the ENT office three weeks postoperatively and then every six months as required by the doctor. 

## 2022-07-23 ENCOUNTER — Other Ambulatory Visit: Payer: Self-pay

## 2022-07-23 ENCOUNTER — Encounter
Admission: RE | Disposition: A | Discharge status: Home / Self Care | Payer: Self-pay | Source: Home / Self Care | Attending: Otolaryngology

## 2022-07-23 ENCOUNTER — Ambulatory Visit
Admission: RE | Admit: 2022-07-23 | Discharge: 2022-07-23 | Disposition: A | Discharge status: Home / Self Care | Payer: BC Managed Care – PPO | Attending: Otolaryngology | Admitting: Otolaryngology

## 2022-07-23 ENCOUNTER — Encounter: Payer: Self-pay | Admitting: Otolaryngology

## 2022-07-23 ENCOUNTER — Ambulatory Visit: Payer: BC Managed Care – PPO | Admitting: Anesthesiology

## 2022-07-23 DIAGNOSIS — M109 Gout, unspecified: Secondary | ICD-10-CM | POA: Insufficient documentation

## 2022-07-23 DIAGNOSIS — Z7984 Long term (current) use of oral hypoglycemic drugs: Secondary | ICD-10-CM | POA: Insufficient documentation

## 2022-07-23 DIAGNOSIS — I2510 Atherosclerotic heart disease of native coronary artery without angina pectoris: Secondary | ICD-10-CM | POA: Insufficient documentation

## 2022-07-23 DIAGNOSIS — Z8616 Personal history of COVID-19: Secondary | ICD-10-CM | POA: Diagnosis not present

## 2022-07-23 DIAGNOSIS — E119 Type 2 diabetes mellitus without complications: Secondary | ICD-10-CM | POA: Diagnosis not present

## 2022-07-23 DIAGNOSIS — H90A32 Mixed conductive and sensorineural hearing loss, unilateral, left ear with restricted hearing on the contralateral side: Secondary | ICD-10-CM | POA: Diagnosis not present

## 2022-07-23 DIAGNOSIS — I10 Essential (primary) hypertension: Secondary | ICD-10-CM | POA: Diagnosis not present

## 2022-07-23 DIAGNOSIS — H73893 Other specified disorders of tympanic membrane, bilateral: Secondary | ICD-10-CM | POA: Insufficient documentation

## 2022-07-23 DIAGNOSIS — J309 Allergic rhinitis, unspecified: Secondary | ICD-10-CM | POA: Diagnosis not present

## 2022-07-23 DIAGNOSIS — F17200 Nicotine dependence, unspecified, uncomplicated: Secondary | ICD-10-CM | POA: Diagnosis not present

## 2022-07-23 DIAGNOSIS — H6983 Other specified disorders of Eustachian tube, bilateral: Secondary | ICD-10-CM | POA: Insufficient documentation

## 2022-07-23 HISTORY — PX: MYRINGOTOMY WITH TUBE PLACEMENT: SHX5663

## 2022-07-23 HISTORY — DX: Rheumatic tricuspid insufficiency: I07.1

## 2022-07-23 LAB — GLUCOSE, CAPILLARY: Glucose-Capillary: 163 mg/dL — ABNORMAL HIGH (ref 70–99)

## 2022-07-23 SURGERY — MYRINGOTOMY WITH TUBE PLACEMENT
Anesthesia: General | Site: Ear | Laterality: Bilateral

## 2022-07-23 MED ORDER — PROPOFOL 10 MG/ML IV BOLUS
INTRAVENOUS | Status: DC | PRN
  Administered 2022-07-23: 150 mg via INTRAVENOUS

## 2022-07-23 MED ORDER — CIPROFLOXACIN-DEXAMETHASONE 0.3-0.1 % OT SUSP
OTIC | Status: DC | PRN
  Administered 2022-07-23: 4 [drp] via OTIC

## 2022-07-23 MED ORDER — MIDAZOLAM HCL 5 MG/5ML IJ SOLN
INTRAMUSCULAR | Status: DC | PRN
  Administered 2022-07-23: 2 mg via INTRAVENOUS

## 2022-07-23 MED ORDER — FENTANYL CITRATE (PF) 100 MCG/2ML IJ SOLN
INTRAMUSCULAR | Status: DC | PRN
  Administered 2022-07-23: 50 ug via INTRAVENOUS

## 2022-07-23 MED ORDER — LIDOCAINE HCL (CARDIAC) PF 100 MG/5ML IV SOSY
PREFILLED_SYRINGE | INTRAVENOUS | Status: DC | PRN
  Administered 2022-07-23: 80 mg via INTRATRACHEAL

## 2022-07-23 MED ORDER — LACTATED RINGERS IV SOLN: INTRAVENOUS | Status: DC

## 2022-07-23 SURGICAL SUPPLY — 13 items
BALL CTTN LRG ABS STRL LF (GAUZE/BANDAGES/DRESSINGS) ×1
BLADE MYR LANCE NRW W/HDL (BLADE) IMPLANT
CANISTER SUCT 1200ML W/VALVE (MISCELLANEOUS) ×2 IMPLANT
COTTONBALL LRG STERILE PKG (GAUZE/BANDAGES/DRESSINGS) ×2 IMPLANT
GLOVE SURG GAMMEX PI TX LF 7.5 (GLOVE) ×2 IMPLANT
STRAP BODY AND KNEE 60X3 (MISCELLANEOUS) ×2 IMPLANT
TOWEL OR 17X26 4PK STRL BLUE (TOWEL DISPOSABLE) ×2 IMPLANT
TUBE EAR T 1.27X4.5 GO LF (OTOLOGIC RELATED) IMPLANT
TUBE EAR T 1.27X5.3 BFLY (OTOLOGIC RELATED) IMPLANT
TUBE EAR VENT ARMSTRNG 1.14 (TUBING) IMPLANT
TUBE EAR VENT ARMSTRNG FM 1.14 (TUBING) IMPLANT
TUBING CONN 6MMX3.1M (TUBING) ×1
TUBING SUCTION CONN 0.25 STRL (TUBING) ×2 IMPLANT

## 2022-07-23 NOTE — H&P (Signed)
..  History and Physical paper copy reviewed and updated date of procedure and will be scanned into system.  Patient seen and examined.  Discussion made and patient wishes to proceed with BUTTERFLY tubes given history of ear issues.  Risks and benefits of butterfly tubes discussed.

## 2022-07-23 NOTE — Transfer of Care (Signed)
Immediate Anesthesia Transfer of Care Note  Patient: Joel Bennett  Procedure(s) Performed: MYRINGOTOMY WITH TUBE PLACEMENT (Bilateral: Ear)  Patient Location: PACU  Anesthesia Type: General  Level of Consciousness: awake, alert  and patient cooperative  Airway and Oxygen Therapy: Patient Spontanous Breathing and Patient connected to supplemental oxygen  Post-op Assessment: Post-op Vital signs reviewed, Patient's Cardiovascular Status Stable, Respiratory Function Stable, Patent Airway and No signs of Nausea or vomiting  Post-op Vital Signs: Reviewed and stable  Complications: No notable events documented.

## 2022-07-23 NOTE — Anesthesia Preprocedure Evaluation (Addendum)
Anesthesia Evaluation  Patient identified by MRN, date of birth, ID band Patient awake    Reviewed: Allergy & Precautions, H&P , NPO status , Patient's Chart, lab work & pertinent test results  Airway Mallampati: IV  TM Distance: <3 FB Neck ROM: Full   Comment: Snores heavily, daytime drowsiness, states has not had sleep study, but wife has CPAP machine and he cannot handle that. Discussed risks untreated sleep apnea, possible Inspire, urged patient to have sleep study and discuss w/his physician whether he may need CPAP or whether he might be a candidate for Inspire or other non-CPAP treatment if he has sleep apnea.  Dental no notable dental hx.    Pulmonary neg pulmonary ROS   Pulmonary exam normal breath sounds clear to auscultation       Cardiovascular hypertension, + CAD  Normal cardiovascular exam Rhythm:Regular Rate:Normal  Echo 03-17-19 EF 60-65%, mild LVH, LA mildly dilated, mild dilation aortic root, 4.2 cm. Mild dilation ascending aorta 3.6, normal PASP, mild TR   Neuro/Psych   Anxiety      Neuromuscular disease  negative psych ROS   GI/Hepatic negative GI ROS, Neg liver ROS,,,  Endo/Other  diabetes, Type 2, Oral Hypoglycemic Agents    Renal/GU negative Renal ROS  negative genitourinary   Musculoskeletal negative musculoskeletal ROS (+) Arthritis , Osteoarthritis,    Abdominal  (+) + obese  Peds negative pediatric ROS (+)  Hematology negative hematology ROS (+)   Anesthesia Other Findings Diabetes type 2, uncontrolled Hypertension DDD (degenerative disc disease History of depression Allergic rhinitis  Stool incontinence  Allergy Diabetes mellitus without complication (HCC)  Seronegative rheumatoid arthritis (HCC) COVID-19 virus infection gout     Reproductive/Obstetrics negative OB ROS                              Anesthesia Physical Anesthesia Plan  ASA: 3  Anesthesia  Plan: General   Post-op Pain Management:    Induction: Intravenous  PONV Risk Score and Plan:   Airway Management Planned: LMA  Additional Equipment:   Intra-op Plan:   Post-operative Plan: Extubation in OR  Informed Consent: I have reviewed the patients History and Physical, chart, labs and discussed the procedure including the risks, benefits and alternatives for the proposed anesthesia with the patient or authorized representative who has indicated his/her understanding and acceptance.     Dental Advisory Given  Plan Discussed with: Anesthesiologist, CRNA and Surgeon  Anesthesia Plan Comments: (Patient consented for risks of anesthesia including but not limited to:  - adverse reactions to medications - damage to eyes, teeth, lips or other oral mucosa - nerve damage due to positioning  - sore throat or hoarseness - Damage to heart, brain, nerves, lungs, other parts of body or loss of life  Patient voiced understanding.)         Anesthesia Quick Evaluation

## 2022-07-23 NOTE — Anesthesia Postprocedure Evaluation (Signed)
Anesthesia Post Note  Patient: Joel Bennett  Procedure(s) Performed: MYRINGOTOMY WITH TUBE PLACEMENT (Bilateral: Ear)  Patient location during evaluation: PACU Anesthesia Type: General Level of consciousness: awake and alert Pain management: pain level controlled Vital Signs Assessment: post-procedure vital signs reviewed and stable Respiratory status: spontaneous breathing, nonlabored ventilation, respiratory function stable and patient connected to nasal cannula oxygen Cardiovascular status: blood pressure returned to baseline and stable Postop Assessment: no apparent nausea or vomiting Anesthetic complications: no   No notable events documented.   Last Vitals:  Vitals:   07/23/22 0923 07/23/22 0932  BP: (!) 141/98 (!) 151/96  Pulse: 73 62  Resp: 13 12  Temp: (!) 36.3 C (!) 36.3 C  SpO2: 92% 92%    Last Pain:  Vitals:   07/23/22 0932  TempSrc:   PainSc: 0-No pain                 Manford Sprong C Meria Crilly

## 2022-07-23 NOTE — Op Note (Signed)
..  07/23/2022  9:18 AM    Joel Bennett  [id number]   Pre-Op Dx:  Joel Bennett tube dysfunction  Post-op Dx: Eustachian tube dysfunction  Proc:Bilateral myringotomy with tubes  Surg: Joel Bennett  Anes:  General by mask  EBL:  None  Comp:  None  Findings:  Severe atelectasis and retraction bilaterally left greater than right.  Butterfly tube placed on right side, angle of TM and small EAC and retraction did not allow for Butterfly tube to be placed on left so regular grommet type tube placed anterior inferiorly.  Procedure: With the patient in a comfortable supine position, general mask anesthesia was administered.  At an appropriate level, microscope and speculum were used to examine and clean the RIGHT ear canal.  The findings were as described above.  An anterior inferior radial myringotomy incision was sharply executed.  Middle ear contents were suctioned clear with a size 5 otologic suction.  A PE tube was placed without difficulty using a Rosen pick and Facilities manager.  Ciprodex otic solution was instilled into the external canal, and insufflated into the middle ear.  A cotton ball was placed at the external meatus. Hemostasis was observed.  This side was completed.  After completing the RIGHT side, the LEFT side was done in identical fashion.    Following this  The patient was returned to anesthesia, awakened, and transferred to recovery in stable condition.  Dispo:  PACU to home  Plan: Routine drop use and water precautions.  Recheck my office three weeks.   Joel Bennett 9:18 AM 07/23/2022

## 2022-07-28 ENCOUNTER — Other Ambulatory Visit: Payer: Self-pay | Admitting: Family Medicine

## 2022-07-28 DIAGNOSIS — E1169 Type 2 diabetes mellitus with other specified complication: Secondary | ICD-10-CM

## 2022-07-30 ENCOUNTER — Other Ambulatory Visit: Payer: Self-pay | Admitting: Family Medicine

## 2022-07-30 DIAGNOSIS — Z760 Encounter for issue of repeat prescription: Secondary | ICD-10-CM

## 2022-08-15 DIAGNOSIS — H6983 Other specified disorders of Eustachian tube, bilateral: Secondary | ICD-10-CM | POA: Diagnosis not present

## 2022-08-15 DIAGNOSIS — H90A32 Mixed conductive and sensorineural hearing loss, unilateral, left ear with restricted hearing on the contralateral side: Secondary | ICD-10-CM | POA: Diagnosis not present

## 2022-08-15 DIAGNOSIS — H90A31 Mixed conductive and sensorineural hearing loss, unilateral, right ear with restricted hearing on the contralateral side: Secondary | ICD-10-CM | POA: Diagnosis not present

## 2022-09-07 ENCOUNTER — Other Ambulatory Visit: Payer: Self-pay | Admitting: Family Medicine

## 2022-09-07 DIAGNOSIS — Z125 Encounter for screening for malignant neoplasm of prostate: Secondary | ICD-10-CM

## 2022-09-07 DIAGNOSIS — E1140 Type 2 diabetes mellitus with diabetic neuropathy, unspecified: Secondary | ICD-10-CM

## 2022-09-07 DIAGNOSIS — M1A0710 Idiopathic chronic gout, right ankle and foot, without tophus (tophi): Secondary | ICD-10-CM

## 2022-09-07 DIAGNOSIS — E1169 Type 2 diabetes mellitus with other specified complication: Secondary | ICD-10-CM

## 2022-09-07 DIAGNOSIS — E538 Deficiency of other specified B group vitamins: Secondary | ICD-10-CM

## 2022-09-09 ENCOUNTER — Other Ambulatory Visit: Payer: BC Managed Care – PPO

## 2022-09-15 ENCOUNTER — Other Ambulatory Visit: Payer: Self-pay | Admitting: Family Medicine

## 2022-09-15 DIAGNOSIS — E1159 Type 2 diabetes mellitus with other circulatory complications: Secondary | ICD-10-CM

## 2022-09-16 ENCOUNTER — Encounter: Payer: BC Managed Care – PPO | Admitting: Family Medicine

## 2022-09-17 NOTE — Telephone Encounter (Signed)
E-scribed refill  Plz schedule CPE and fasting lab (no food/drink- except water and/or blk coffee 5 hrs prior) visits for additional refills.  

## 2022-09-17 NOTE — Telephone Encounter (Signed)
Noted  

## 2022-09-17 NOTE — Telephone Encounter (Signed)
Patient has been scheduled

## 2022-09-20 ENCOUNTER — Other Ambulatory Visit: Payer: Self-pay | Admitting: Family Medicine

## 2022-09-20 DIAGNOSIS — E1140 Type 2 diabetes mellitus with diabetic neuropathy, unspecified: Secondary | ICD-10-CM

## 2022-11-02 ENCOUNTER — Other Ambulatory Visit: Payer: Self-pay | Admitting: Family Medicine

## 2022-11-02 DIAGNOSIS — E1159 Type 2 diabetes mellitus with other circulatory complications: Secondary | ICD-10-CM

## 2022-11-03 ENCOUNTER — Other Ambulatory Visit: Payer: Self-pay | Admitting: Family Medicine

## 2022-11-03 DIAGNOSIS — E1169 Type 2 diabetes mellitus with other specified complication: Secondary | ICD-10-CM

## 2022-11-03 DIAGNOSIS — Z760 Encounter for issue of repeat prescription: Secondary | ICD-10-CM

## 2022-11-04 MED ORDER — AMLODIPINE BESYLATE 5 MG PO TABS
5.0000 mg | ORAL_TABLET | Freq: Every day | ORAL | 0 refills | Status: DC
Start: 2022-11-04 — End: 2022-11-08

## 2022-11-08 ENCOUNTER — Other Ambulatory Visit: Payer: Self-pay | Admitting: Family Medicine

## 2022-11-08 DIAGNOSIS — I152 Hypertension secondary to endocrine disorders: Secondary | ICD-10-CM

## 2022-11-11 ENCOUNTER — Other Ambulatory Visit: Payer: Self-pay | Admitting: Family Medicine

## 2022-11-11 DIAGNOSIS — I152 Hypertension secondary to endocrine disorders: Secondary | ICD-10-CM

## 2022-11-13 MED ORDER — OLMESARTAN MEDOXOMIL 40 MG PO TABS
40.0000 mg | ORAL_TABLET | Freq: Every day | ORAL | 0 refills | Status: DC
Start: 2022-11-13 — End: 2023-01-07

## 2022-11-13 MED ORDER — AMLODIPINE BESYLATE 5 MG PO TABS
5.0000 mg | ORAL_TABLET | Freq: Every day | ORAL | 0 refills | Status: DC
Start: 2022-11-13 — End: 2023-01-07

## 2022-12-07 ENCOUNTER — Other Ambulatory Visit: Payer: Self-pay | Admitting: Family Medicine

## 2022-12-08 NOTE — Telephone Encounter (Signed)
Last office visit 05/28/2022 for arthritis multiple joint involvement with Dr. Patsy Lager.  Last refilled 05/28/2022 for #30 with 2 refills.  Next Appt: CPE 01/07/2023 with PCP.

## 2022-12-31 ENCOUNTER — Other Ambulatory Visit (INDEPENDENT_AMBULATORY_CARE_PROVIDER_SITE_OTHER): Payer: 59

## 2022-12-31 DIAGNOSIS — E785 Hyperlipidemia, unspecified: Secondary | ICD-10-CM | POA: Diagnosis not present

## 2022-12-31 DIAGNOSIS — E538 Deficiency of other specified B group vitamins: Secondary | ICD-10-CM | POA: Diagnosis not present

## 2022-12-31 DIAGNOSIS — M1A0710 Idiopathic chronic gout, right ankle and foot, without tophus (tophi): Secondary | ICD-10-CM | POA: Diagnosis not present

## 2022-12-31 DIAGNOSIS — E1169 Type 2 diabetes mellitus with other specified complication: Secondary | ICD-10-CM

## 2022-12-31 DIAGNOSIS — E1140 Type 2 diabetes mellitus with diabetic neuropathy, unspecified: Secondary | ICD-10-CM

## 2022-12-31 DIAGNOSIS — Z125 Encounter for screening for malignant neoplasm of prostate: Secondary | ICD-10-CM

## 2022-12-31 LAB — COMPREHENSIVE METABOLIC PANEL
ALT: 27 U/L (ref 0–53)
AST: 29 U/L (ref 0–37)
Albumin: 4.1 g/dL (ref 3.5–5.2)
Alkaline Phosphatase: 73 U/L (ref 39–117)
BUN: 10 mg/dL (ref 6–23)
CO2: 28 meq/L (ref 19–32)
Calcium: 9.3 mg/dL (ref 8.4–10.5)
Chloride: 103 meq/L (ref 96–112)
Creatinine, Ser: 0.8 mg/dL (ref 0.40–1.50)
GFR: 97.31 mL/min (ref >60.00)
Glucose, Bld: 142 mg/dL — ABNORMAL HIGH (ref 70–99)
Potassium: 4.3 meq/L (ref 3.5–5.1)
Sodium: 140 meq/L (ref 135–145)
Total Bilirubin: 0.5 mg/dL (ref 0.2–1.2)
Total Protein: 6.4 g/dL (ref 6.0–8.3)

## 2022-12-31 LAB — LIPID PANEL
Cholesterol: 100 mg/dL (ref 0–200)
HDL: 35 mg/dL — ABNORMAL LOW (ref >39.00)
LDL Cholesterol: 40 mg/dL (ref 0–99)
NonHDL: 65.05
Total CHOL/HDL Ratio: 3
Triglycerides: 127 mg/dL (ref 0.0–149.0)
VLDL: 25.4 mg/dL (ref 0.0–40.0)

## 2022-12-31 LAB — CBC WITH DIFFERENTIAL/PLATELET
Basophils Absolute: 0 10*3/uL (ref 0.0–0.1)
Basophils Relative: 0.8 % (ref 0.0–3.0)
Eosinophils Absolute: 0.3 10*3/uL (ref 0.0–0.7)
Eosinophils Relative: 4.8 % (ref 0.0–5.0)
HCT: 45.8 % (ref 39.0–52.0)
Hemoglobin: 15 g/dL (ref 13.0–17.0)
Lymphocytes Relative: 29.7 % (ref 12.0–46.0)
Lymphs Abs: 1.7 10*3/uL (ref 0.7–4.0)
MCHC: 32.8 g/dL (ref 30.0–36.0)
MCV: 98.3 fL (ref 78.0–100.0)
Monocytes Absolute: 0.6 10*3/uL (ref 0.1–1.0)
Monocytes Relative: 10.2 % (ref 3.0–12.0)
Neutro Abs: 3 10*3/uL (ref 1.4–7.7)
Neutrophils Relative %: 54.5 % (ref 43.0–77.0)
Platelets: 178 10*3/uL (ref 150.0–400.0)
RBC: 4.66 Mil/uL (ref 4.22–5.81)
RDW: 13.9 % (ref 11.5–15.5)
WBC: 5.6 10*3/uL (ref 4.0–10.5)

## 2022-12-31 LAB — VITAMIN B12: Vitamin B-12: 528 pg/mL (ref 211–911)

## 2022-12-31 LAB — PSA: PSA: 0.65 ng/mL (ref 0.10–4.00)

## 2022-12-31 LAB — HEMOGLOBIN A1C: Hgb A1c MFr Bld: 7.9 % — ABNORMAL HIGH (ref 4.6–6.5)

## 2022-12-31 LAB — MICROALBUMIN / CREATININE URINE RATIO
Creatinine,U: 131.1 mg/dL
Microalb Creat Ratio: 0.7 mg/g (ref 0.0–30.0)
Microalb, Ur: 0.9 mg/dL (ref 0.0–1.9)

## 2022-12-31 LAB — URIC ACID: Uric Acid, Serum: 7.1 mg/dL (ref 4.0–7.8)

## 2023-01-07 ENCOUNTER — Ambulatory Visit (INDEPENDENT_AMBULATORY_CARE_PROVIDER_SITE_OTHER): Payer: 59 | Admitting: Family Medicine

## 2023-01-07 ENCOUNTER — Encounter: Payer: Self-pay | Admitting: Family Medicine

## 2023-01-07 VITALS — BP 136/78 | HR 82 | Temp 98.1°F | Ht 68.25 in | Wt 265.4 lb

## 2023-01-07 DIAGNOSIS — R490 Dysphonia: Secondary | ICD-10-CM

## 2023-01-07 DIAGNOSIS — Z7984 Long term (current) use of oral hypoglycemic drugs: Secondary | ICD-10-CM

## 2023-01-07 DIAGNOSIS — I152 Hypertension secondary to endocrine disorders: Secondary | ICD-10-CM

## 2023-01-07 DIAGNOSIS — M1A0710 Idiopathic chronic gout, right ankle and foot, without tophus (tophi): Secondary | ICD-10-CM | POA: Diagnosis not present

## 2023-01-07 DIAGNOSIS — E1140 Type 2 diabetes mellitus with diabetic neuropathy, unspecified: Secondary | ICD-10-CM

## 2023-01-07 DIAGNOSIS — E785 Hyperlipidemia, unspecified: Secondary | ICD-10-CM

## 2023-01-07 DIAGNOSIS — I25119 Atherosclerotic heart disease of native coronary artery with unspecified angina pectoris: Secondary | ICD-10-CM

## 2023-01-07 DIAGNOSIS — R079 Chest pain, unspecified: Secondary | ICD-10-CM

## 2023-01-07 DIAGNOSIS — M0600 Rheumatoid arthritis without rheumatoid factor, unspecified site: Secondary | ICD-10-CM

## 2023-01-07 DIAGNOSIS — Z0001 Encounter for general adult medical examination with abnormal findings: Secondary | ICD-10-CM | POA: Diagnosis not present

## 2023-01-07 DIAGNOSIS — Q319 Congenital malformation of larynx, unspecified: Secondary | ICD-10-CM | POA: Insufficient documentation

## 2023-01-07 DIAGNOSIS — R0981 Nasal congestion: Secondary | ICD-10-CM

## 2023-01-07 DIAGNOSIS — E1159 Type 2 diabetes mellitus with other circulatory complications: Secondary | ICD-10-CM

## 2023-01-07 DIAGNOSIS — E538 Deficiency of other specified B group vitamins: Secondary | ICD-10-CM

## 2023-01-07 DIAGNOSIS — E1169 Type 2 diabetes mellitus with other specified complication: Secondary | ICD-10-CM

## 2023-01-07 DIAGNOSIS — H6523 Chronic serous otitis media, bilateral: Secondary | ICD-10-CM

## 2023-01-07 MED ORDER — FREESTYLE LITE TEST VI STRP: ORAL_STRIP | 3 refills | Status: AC

## 2023-01-07 MED ORDER — EMPAGLIFLOZIN 10 MG PO TABS
10.0000 mg | ORAL_TABLET | Freq: Every day | ORAL | 11 refills | Status: DC
Start: 2023-01-07 — End: 2023-02-06

## 2023-01-07 MED ORDER — CELECOXIB 200 MG PO CAPS: 200.0000 mg | ORAL_CAPSULE | Freq: Every day | ORAL | 6 refills | Status: DC

## 2023-01-07 MED ORDER — INDOMETHACIN 50 MG PO CAPS: 50.0000 mg | ORAL_CAPSULE | Freq: Three times a day (TID) | ORAL | 0 refills | Status: AC | PRN

## 2023-01-07 MED ORDER — GLIMEPIRIDE 2 MG PO TABS: 2.0000 mg | ORAL_TABLET | Freq: Every day | ORAL | 4 refills | Status: DC

## 2023-01-07 MED ORDER — ISOSORBIDE MONONITRATE ER 30 MG PO TB24: 15.0000 mg | ORAL_TABLET | Freq: Every day | ORAL | 3 refills | Status: DC

## 2023-01-07 MED ORDER — AMLODIPINE BESYLATE 5 MG PO TABS
5.0000 mg | ORAL_TABLET | Freq: Every day | ORAL | 4 refills | Status: DC
Start: 2023-01-07 — End: 2024-02-01

## 2023-01-07 MED ORDER — ATORVASTATIN CALCIUM 40 MG PO TABS
40.0000 mg | ORAL_TABLET | Freq: Every day | ORAL | 4 refills | Status: DC
Start: 2023-01-07 — End: 2024-01-26

## 2023-01-07 MED ORDER — OLMESARTAN MEDOXOMIL 40 MG PO TABS
40.0000 mg | ORAL_TABLET | Freq: Every day | ORAL | 4 refills | Status: DC
Start: 2023-01-07 — End: 2024-02-01

## 2023-01-07 NOTE — Assessment & Plan Note (Signed)
Continues oral vitamin B12 daily.

## 2023-01-07 NOTE — Assessment & Plan Note (Addendum)
Stable period without recent gout flares.  He is on daily celebrex.  Reviewed dietary measures to prevent gout flare.

## 2023-01-07 NOTE — Assessment & Plan Note (Signed)
On nasal flonase daily via ENT. See above re hoarseness. Has ENT f/u scheduled next month.

## 2023-01-07 NOTE — Assessment & Plan Note (Signed)
Now on celebrex 200mg  daily.

## 2023-01-07 NOTE — Assessment & Plan Note (Signed)
S/p permanent ear tube on right, temporary on left (07/2022 ENT Dr Andee Poles)

## 2023-01-07 NOTE — Assessment & Plan Note (Addendum)
May have started with regular BID flonase use.  Will hold and reassess. Already has ENT f/u planned next month.

## 2023-01-07 NOTE — Assessment & Plan Note (Addendum)
Saw cardiology 2021 - anticipated microvascular disease.  Coronary calcium score of 735 (98%) Now on aspirin, statin. Will add low dose imdur as per above.

## 2023-01-07 NOTE — Assessment & Plan Note (Signed)
Chronic, uncontrolled.  Continue amaryl.  He will restart jardiance 10mg  daily and monitor effect on sugar control.

## 2023-01-07 NOTE — Assessment & Plan Note (Signed)
Ongoing. Reviewed cardiac evaluation from 2021 [facility]). If needed, he would want to see different cardiologist.  ?microvascular disease related discomfort - will restart imdur 15mg  daily, update with effect.

## 2023-01-07 NOTE — Assessment & Plan Note (Signed)
Chronic, stable period on atorvastatin - continue. The ASCVD Risk score (Arnett DK, et al., 2019) failed to calculate for the following reasons:   The valid total cholesterol range is 130 to 320 mg/dL

## 2023-01-07 NOTE — Assessment & Plan Note (Signed)
Chronic, stable. Continue current regimen. 

## 2023-01-07 NOTE — Progress Notes (Signed)
Ph: [phone] Fax: [phone]   Patient ID: Joel Bennett, male    DOB: 07/01/1965, 57 y.o.   MRN: [id number]  This visit was conducted in person.  BP 136/78   Pulse 82   Temp 98.1 F (36.7 C) (Oral)   Ht 5' 8.25" (1.734 m)   Wt 265 lb 6 oz (120.4 kg)   SpO2 97%   BMI 40.06 kg/m    CC: CPE Subjective:   HPI: Joel Bennett is a 57 y.o. male presenting on 01/07/2023 for Annual Exam   H/o seronegative RA saw rheum Elpidio Anis PA) 04/2019 - previously on MTX weekly without benefit so stopped. No further hand redness or warmth but hands stay painful/swollen. Has received prednisone course as well as celebrex with benefit.   DM - managed on amaryl 2mg  daily and Jardiance 10mg  daily. Out of Jardiance for 2 months due to cost/no insurance. Did not tolerate metformin XR or ozempic, and rybelsus unaffordable. Stopped actos 15mg  due to weight gain.    HTN - continues amlodipine 5mg  daily, benicar 40mg  daily.   Saw neurology Dr Epimenio Foot 2023. Suspected cervical neck issue s/p MR cervical spine and MR brain on Wednesday.  S/p myringotomy with bilateral tube placement 07/2022 (Vaught).  Hoarse voice over the past 5 months of unclear etiology, stays hoarse. Upcoming ENT appt next month. Denies GERD or allergy symptoms at this time. On flonase 2 sprays into each nostril BID per ENT rec.   Frequent L sided chest pain described as dull ache, not exertional, not relieved by rest.  From prior cardiac evaluation 05/2019: -Echocardiogram shows normal systolic and diastolic function.  Mild LVH, mild aortic root dilatation measuring 4.2 cm.  Coronary CTA showed mild calcification in the proximal to mid LAD, coronary calcium score of 735. -Has occasional chest discomfort.  It is possible he may have microvascular disease especially as symptoms improve with nitroglycerin.  Preventative: COLONOSCOPY 11/2018 - TA, rpt 7 yrs Christella Hartigan)  Prostate cancer screening - yearly PSA, declines rectal  exam. Lung cancer screening - not eligible  Flu shot - declines  Td - ~2009, declines repeat Pneumococcal vaccine - declines  Shingrix - declines Seat belt use discussed Sunscreen use discussed. Denies changing moles on skin.  Sleep - averaging 6 hours/night  Non smoker  Alcohol - 1-2 times a week, 5-6 beers  Dentist - has not seen Eye exam yearly - due   Lives with wife Grown children Occ: Librarian, academic Edu: HS Activity: no regular exercise, stays active at work and in yard Diet: good water, fruits/vegetables some, red meat regularly     Relevant past medical, surgical, family and social history reviewed and updated as indicated. Interim medical history since our last visit reviewed. Allergies and medications reviewed and updated. Outpatient Medications Prior to Visit  Medication Sig Dispense Refill   aspirin EC 81 MG tablet Take 1 tablet (81 mg total) by mouth daily.     fluticasone (FLONASE) 50 MCG/ACT nasal spray Place 2 sprays into both nostrils daily. 16 g 0   vitamin B-12 (CYANOCOBALAMIN) 1000 MCG tablet Take 1 tablet (1,000 mcg total) by mouth daily.     amLODipine (NORVASC) 5 MG tablet Take 1 tablet (5 mg total) by mouth daily. 90 tablet 0   atorvastatin (LIPITOR) 40 MG tablet TAKE ONE TABLET BY MOUTH ONE TIME DAILY 90 tablet 0   celecoxib (CELEBREX) 200 MG capsule TAKE ONE CAPSULE BY MOUTH ONE TIME DAILY 30 capsule 2   empagliflozin (JARDIANCE)  10 MG TABS tablet TAKE ONE TABLET BY MOUTH ONE TIME DAILY **MUST CALL DR. FOR APPOINTMENT FOR FURTHER REFILLS** 30 tablet 3   glimepiride (AMARYL) 2 MG tablet TAKE ONE TABLET BY MOUTH EVERY MORNING BEFORE BREAKFAST 90 tablet 0   glucose blood (FREESTYLE LITE) test strip Use as instructed to check sugars once daily and as needed E11.40 100 each 3   indomethacin (INDOCIN) 50 MG capsule Take 1 capsule (50 mg total) by mouth 3 (three) times daily as needed. 30 capsule 0   olmesartan (BENICAR) 40 MG tablet Take 1 tablet (40 mg  total) by mouth daily. 90 tablet 0   ciprofloxacin-dexamethasone (CIPRODEX) OTIC suspension Place 4 drops into affected ears 2 (two) times daily for 5 days. (DOS 07/23/22) 7.5 mL 0   colchicine 0.6 MG tablet TAKE ONE TABLET BY MOUTH TWICE A DAY (Patient taking differently: Take 0.6 mg by mouth as needed.) 60 tablet 1   pioglitazone (ACTOS) 15 MG tablet Take 1 tablet (15 mg total) by mouth daily. (Patient not taking: Reported on 07/15/2022) 90 tablet 3   No facility-administered medications prior to visit.     Per HPI unless specifically indicated in ROS section below Review of Systems  Constitutional:  Negative for activity change, appetite change, chills, fatigue, fever and unexpected weight change.  HENT:  Negative for hearing loss.   Eyes:  Negative for visual disturbance.  Respiratory:  Negative for cough, chest tightness, shortness of breath and wheezing.   Cardiovascular:  Positive for chest pain (left sided into axilla) and leg swelling (occ). Negative for palpitations.  Gastrointestinal:  Negative for abdominal distention, abdominal pain, blood in stool, constipation, diarrhea, nausea and vomiting.  Genitourinary:  Negative for difficulty urinating and hematuria.  Musculoskeletal:  Negative for arthralgias, myalgias and neck pain.  Skin:  Negative for rash.  Neurological:  Negative for dizziness, seizures, syncope and headaches.  Hematological:  Negative for adenopathy. Bruises/bleeds easily.  Psychiatric/Behavioral:  Negative for dysphoric mood. The patient is not nervous/anxious.     Objective:  BP 136/78   Pulse 82   Temp 98.1 F (36.7 C) (Oral)   Ht 5' 8.25" (1.734 m)   Wt 265 lb 6 oz (120.4 kg)   SpO2 97%   BMI 40.06 kg/m   Wt Readings from Last 3 Encounters:  01/07/23 265 lb 6 oz (120.4 kg)  07/23/22 264 lb (119.7 kg)  05/28/22 272 lb 8 oz (123.6 kg)      Physical Exam Vitals and nursing note reviewed.  Constitutional:      General: He is not in acute distress.     Appearance: Normal appearance. He is well-developed. He is not ill-appearing.  HENT:     Head: Normocephalic and atraumatic.     Right Ear: Hearing, tympanic membrane, ear canal and external ear normal.     Left Ear: Hearing, tympanic membrane, ear canal and external ear normal.     Mouth/Throat:     Mouth: Mucous membranes are moist.     Pharynx: Oropharynx is clear. No oropharyngeal exudate or posterior oropharyngeal erythema.  Eyes:     General: No scleral icterus.    Extraocular Movements: Extraocular movements intact.     Conjunctiva/sclera: Conjunctivae normal.     Pupils: Pupils are equal, round, and reactive to light.  Neck:     Thyroid: No thyroid mass or thyromegaly.  Cardiovascular:     Rate and Rhythm: Normal rate and regular rhythm.     Pulses: Normal pulses.  Radial pulses are 2+ on the right side and 2+ on the left side.     Heart sounds: Normal heart sounds. No murmur heard. Pulmonary:     Effort: Pulmonary effort is normal. No respiratory distress.     Breath sounds: Normal breath sounds. No wheezing, rhonchi or rales.  Abdominal:     General: Bowel sounds are normal. There is no distension.     Palpations: Abdomen is soft. There is no mass.     Tenderness: There is no abdominal tenderness. There is no guarding or rebound.     Hernia: No hernia is present.  Musculoskeletal:        General: Normal range of motion.     Cervical back: Normal range of motion and neck supple.     Right lower leg: No edema.     Left lower leg: No edema.  Lymphadenopathy:     Cervical: No cervical adenopathy.  Skin:    General: Skin is warm and dry.     Findings: No rash.  Neurological:     General: No focal deficit present.     Mental Status: He is alert and oriented to person, place, and time.  Psychiatric:        Mood and Affect: Mood normal.        Behavior: Behavior normal.        Thought Content: Thought content normal.        Judgment: Judgment normal.        Results for orders placed or performed in visit on 12/31/22  CBC with Differential/Platelet  Result Value Ref Range   WBC 5.6 4.0 - 10.5 K/uL   RBC 4.66 4.22 - 5.81 Mil/uL   Hemoglobin 15.0 13.0 - 17.0 g/dL   HCT 16.1 09.6 - 04.5 %   MCV 98.3 78.0 - 100.0 fl   MCHC 32.8 30.0 - 36.0 g/dL   RDW 40.9 81.1 - 91.4 %   Platelets 178.0 150.0 - 400.0 K/uL   Neutrophils Relative % 54.5 43.0 - 77.0 %   Lymphocytes Relative 29.7 12.0 - 46.0 %   Monocytes Relative 10.2 3.0 - 12.0 %   Eosinophils Relative 4.8 0.0 - 5.0 %   Basophils Relative 0.8 0.0 - 3.0 %   Neutro Abs 3.0 1.4 - 7.7 K/uL   Lymphs Abs 1.7 0.7 - 4.0 K/uL   Monocytes Absolute 0.6 0.1 - 1.0 K/uL   Eosinophils Absolute 0.3 0.0 - 0.7 K/uL   Basophils Absolute 0.0 0.0 - 0.1 K/uL  Vitamin B12  Result Value Ref Range   Vitamin B-12 528 211 - 911 pg/mL  PSA  Result Value Ref Range   PSA 0.65 0.10 - 4.00 ng/mL  Uric acid  Result Value Ref Range   Uric Acid, Serum 7.1 4.0 - 7.8 mg/dL  Comprehensive metabolic panel  Result Value Ref Range   Sodium 140 135 - 145 mEq/L   Potassium 4.3 3.5 - 5.1 mEq/L   Chloride 103 96 - 112 mEq/L   CO2 28 19 - 32 mEq/L   Glucose, Bld 142 (H) 70 - 99 mg/dL   BUN 10 6 - 23 mg/dL   Creatinine, Ser 7.82 0.40 - 1.50 mg/dL   Total Bilirubin 0.5 0.2 - 1.2 mg/dL   Alkaline Phosphatase 73 39 - 117 U/L   AST 29 0 - 37 U/L   ALT 27 0 - 53 U/L   Total Protein 6.4 6.0 - 8.3 g/dL   Albumin 4.1 3.5 - 5.2  g/dL   GFR 44.01 >02.72 mL/min   Calcium 9.3 8.4 - 10.5 mg/dL  Lipid panel  Result Value Ref Range   Cholesterol 100 0 - 200 mg/dL   Triglycerides 536.6 0.0 - 149.0 mg/dL   HDL 44.03 (L) >47.42 mg/dL   VLDL 59.5 0.0 - 63.8 mg/dL   LDL Cholesterol 40 0 - 99 mg/dL   Total CHOL/HDL Ratio 3    NonHDL 65.05   Microalbumin / creatinine urine ratio  Result Value Ref Range   Microalb, Ur 0.9 0.0 - 1.9 mg/dL   Creatinine,U 756.4 mg/dL   Microalb Creat Ratio 0.7 0.0 - 30.0 mg/g  Hemoglobin A1c  Result  Value Ref Range   Hgb A1c MFr Bld 7.9 (H) 4.6 - 6.5 %    Assessment & Plan:   Problem List Items Addressed This Visit     Encounter for general adult medical examination with abnormal findings - Primary (Chronic)    Preventative protocols reviewed and updated unless pt declined. Discussed healthy diet and lifestyle.       Gout    Stable period without recent gout flares.  He is on daily celebrex.  Reviewed dietary measures to prevent gout flare.       Relevant Medications   indomethacin (INDOCIN) 50 MG capsule   Hypertension associated with diabetes (HCC)    Chronic, stable. Continue current regimen.       Relevant Medications   amLODipine (NORVASC) 5 MG tablet   atorvastatin (LIPITOR) 40 MG tablet   glimepiride (AMARYL) 2 MG tablet   olmesartan (BENICAR) 40 MG tablet   empagliflozin (JARDIANCE) 10 MG TABS tablet   isosorbide mononitrate (IMDUR) 30 MG 24 hr tablet   Type 2 diabetes mellitus with diabetic neuropathy, unspecified (HCC)    Chronic, uncontrolled.  Continue amaryl.  He will restart jardiance 10mg  daily and monitor effect on sugar control.       Relevant Medications   atorvastatin (LIPITOR) 40 MG tablet   glimepiride (AMARYL) 2 MG tablet   glucose blood (FREESTYLE LITE) test strip   olmesartan (BENICAR) 40 MG tablet   empagliflozin (JARDIANCE) 10 MG TABS tablet   Left-sided chest pain    Ongoing. Reviewed cardiac evaluation from 2021 [facility]). If needed, he would want to see different cardiologist.  ?microvascular disease related discomfort - will restart imdur 15mg  daily, update with effect.       Dyslipidemia associated with type 2 diabetes mellitus (HCC)    Chronic, stable period on atorvastatin - continue. The ASCVD Risk score (Arnett DK, et al., 2019) failed to calculate for the following reasons:   The valid total cholesterol range is 130 to 320 mg/dL       Relevant Medications   atorvastatin (LIPITOR) 40 MG tablet   glimepiride (AMARYL)  2 MG tablet   olmesartan (BENICAR) 40 MG tablet   empagliflozin (JARDIANCE) 10 MG TABS tablet   CAD (coronary artery disease), native coronary artery    Saw cardiology 2021 - anticipated microvascular disease.  Coronary calcium score of 735 (98%) Now on aspirin, statin. Will add low dose imdur as per above.       Relevant Medications   amLODipine (NORVASC) 5 MG tablet   atorvastatin (LIPITOR) 40 MG tablet   olmesartan (BENICAR) 40 MG tablet   isosorbide mononitrate (IMDUR) 30 MG 24 hr tablet   Seronegative rheumatoid arthritis (HCC)    Now on celebrex 200mg  daily.       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       Chronic nasal congestion    On nasal flonase daily via ENT. See above re hoarseness. Has ENT f/u scheduled next month.       Hoarseness of voice    May have started with regular BID flonase use.  Will hold and reassess. Already has ENT f/u planned next month.         Meds ordered this encounter  Medications   amLODipine (NORVASC) 5 MG tablet    Sig: Take 1 tablet (5 mg total) by mouth daily.    Dispense:  90 tablet    Refill:  4   atorvastatin (LIPITOR) 40 MG tablet    Sig: Take 1 tablet (40 mg total) by mouth daily.    Dispense:  90 tablet    Refill:  4   glimepiride (AMARYL) 2 MG tablet    Sig: Take 1 tablet (2 mg total) by mouth daily with breakfast.    Dispense:  90 tablet    Refill:  4   glucose blood (FREESTYLE LITE) test strip    Sig: Use as instructed to check blood sugars once daily    Dispense:  100 each    Refill:  3   indomethacin (INDOCIN) 50 MG capsule    Sig: Take 1 capsule (50 mg total) by mouth 3 (three) times daily as needed (gout flare).    Dispense:  30 capsule    Refill:  0    olmesartan (BENICAR) 40 MG tablet    Sig: Take 1 tablet (40 mg total) by mouth daily.    Dispense:  90 tablet    Refill:  4   empagliflozin (JARDIANCE) 10 MG TABS tablet    Sig: Take 1 tablet (10 mg total) by mouth daily.    Dispense:  30 tablet    Refill:  11   celecoxib (CELEBREX) 200 MG capsule    Sig: Take 1 capsule (200 mg total) by mouth daily.    Dispense:  30 capsule    Refill:  6   isosorbide mononitrate (IMDUR) 30 MG 24 hr tablet    Sig: Take 0.5 tablets (15 mg total) by mouth daily.    Dispense:  45 tablet    Refill:  3    No orders of the defined types were placed in this encounter.   Patient Instructions  Try isosorbide (imdur) 30mg  1/2 tablet (15mg ) daily for chest discomfort. Let us know if any worsening symptoms.  Schedule eye exam with [facility], have them send me report.  Good to see you today  Return as needed or in 6 months for diabetes follow up visit   Follow up plan: Return in about 6 months (around 07/08/2023) for follow up visit.  Eustaquio Boyden, MD

## 2023-01-07 NOTE — Assessment & Plan Note (Signed)
Preventative protocols reviewed and updated unless pt declined. Discussed healthy diet and lifestyle.  

## 2023-01-07 NOTE — Patient Instructions (Addendum)
Try isosorbide (imdur) 30mg  1/2 tablet (15mg ) daily for chest discomfort. Let us know if any worsening symptoms.  Schedule eye exam with [facility], have them send me report.  Good to see you today  Return as needed or in 6 months for diabetes follow up visit

## 2023-02-02 ENCOUNTER — Encounter: Payer: Self-pay | Admitting: Family Medicine

## 2023-02-06 MED ORDER — EMPAGLIFLOZIN 25 MG PO TABS: 25.0000 mg | ORAL_TABLET | Freq: Every day | ORAL | 11 refills | Status: DC

## 2023-02-06 NOTE — Addendum Note (Signed)
Addended by: Eustaquio Boyden on: 02/06/2023 01:40 PM   Modules accepted: Orders

## 2023-03-27 DIAGNOSIS — R490 Dysphonia: Secondary | ICD-10-CM | POA: Diagnosis not present

## 2023-03-27 DIAGNOSIS — J383 Other diseases of vocal cords: Secondary | ICD-10-CM | POA: Diagnosis not present

## 2023-03-27 DIAGNOSIS — H6982 Other specified disorders of Eustachian tube, left ear: Secondary | ICD-10-CM | POA: Diagnosis not present

## 2023-04-08 ENCOUNTER — Other Ambulatory Visit: Payer: Self-pay | Admitting: Otolaryngology

## 2023-04-15 ENCOUNTER — Encounter
Admission: RE | Admit: 2023-04-15 | Discharge: 2023-04-15 | Disposition: A | Discharge status: Home / Self Care | Payer: BC Managed Care – PPO | Source: Ambulatory Visit | Attending: Otolaryngology | Admitting: Otolaryngology

## 2023-04-15 VITALS — Ht 68.25 in | Wt 269.0 lb

## 2023-04-15 DIAGNOSIS — Z01818 Encounter for other preprocedural examination: Secondary | ICD-10-CM | POA: Insufficient documentation

## 2023-04-15 DIAGNOSIS — I77810 Thoracic aortic ectasia: Secondary | ICD-10-CM

## 2023-04-15 DIAGNOSIS — E1159 Type 2 diabetes mellitus with other circulatory complications: Secondary | ICD-10-CM

## 2023-04-15 DIAGNOSIS — Q87410 Marfan's syndrome with aortic dilation: Secondary | ICD-10-CM

## 2023-04-15 DIAGNOSIS — R9431 Abnormal electrocardiogram [ECG] [EKG]: Secondary | ICD-10-CM | POA: Diagnosis not present

## 2023-04-15 DIAGNOSIS — Z01810 Encounter for preprocedural cardiovascular examination: Secondary | ICD-10-CM | POA: Diagnosis not present

## 2023-04-15 DIAGNOSIS — I25119 Atherosclerotic heart disease of native coronary artery with unspecified angina pectoris: Secondary | ICD-10-CM

## 2023-04-15 DIAGNOSIS — E1140 Type 2 diabetes mellitus with diabetic neuropathy, unspecified: Secondary | ICD-10-CM | POA: Diagnosis not present

## 2023-04-15 DIAGNOSIS — Z01812 Encounter for preprocedural laboratory examination: Secondary | ICD-10-CM

## 2023-04-15 DIAGNOSIS — I152 Hypertension secondary to endocrine disorders: Secondary | ICD-10-CM | POA: Insufficient documentation

## 2023-04-15 HISTORY — DX: Personal history of urinary calculi: Z87.442

## 2023-04-15 HISTORY — DX: Angina pectoris, unspecified: I20.9

## 2023-04-15 HISTORY — DX: Chronic serous otitis media, bilateral: H65.23

## 2023-04-15 HISTORY — DX: Cardiomegaly: I51.7

## 2023-04-15 HISTORY — DX: Osteoarthritis of first carpometacarpal joint, unspecified: M18.9

## 2023-04-15 HISTORY — DX: Thoracic aortic ectasia: I77.810

## 2023-04-15 HISTORY — DX: Unspecified Eustachian tube disorder, left ear: H69.92

## 2023-04-15 HISTORY — DX: Dysphonia: R49.0

## 2023-04-15 HISTORY — DX: Polymyalgia rheumatica: M35.3

## 2023-04-15 HISTORY — DX: Anxiety disorder, unspecified: F41.9

## 2023-04-15 HISTORY — DX: Other specified abnormal findings of blood chemistry: R79.89

## 2023-04-15 HISTORY — DX: Other diseases of vocal cords: J38.3

## 2023-04-15 HISTORY — DX: Hyperlipidemia, unspecified: E78.5

## 2023-04-15 HISTORY — DX: Atherosclerotic heart disease of native coronary artery without angina pectoris: I25.10

## 2023-04-15 LAB — BASIC METABOLIC PANEL
Anion gap: 10 (ref 5–15)
BUN: 16 mg/dL (ref 6–20)
CO2: 24 mmol/L (ref 22–32)
Calcium: 9.1 mg/dL (ref 8.9–10.3)
Chloride: 103 mmol/L (ref 98–111)
Creatinine, Ser: 0.89 mg/dL (ref 0.61–1.24)
GFR, Estimated: >60 mL/min (ref >60)
Glucose, Bld: 120 mg/dL — ABNORMAL HIGH (ref 70–99)
Potassium: 3.7 mmol/L (ref 3.5–5.1)
Sodium: 137 mmol/L (ref 135–145)

## 2023-04-15 LAB — CBC
HCT: 46.5 % (ref 39.0–52.0)
Hemoglobin: 15.7 g/dL (ref 13.0–17.0)
MCH: 32.7 pg (ref 26.0–34.0)
MCHC: 33.8 g/dL (ref 30.0–36.0)
MCV: 96.9 fL (ref 80.0–100.0)
Platelets: 175 10*3/uL (ref 150–400)
RBC: 4.8 MIL/uL (ref 4.22–5.81)
RDW: 13.2 % (ref 11.5–15.5)
WBC: 7.1 10*3/uL (ref 4.0–10.5)
nRBC: 0 % (ref 0.0–0.2)

## 2023-04-15 NOTE — Patient Instructions (Addendum)
Your procedure is scheduled on:04-21-23 Tuesday Report to the Registration Desk on the 1st floor of the [facility].Then proceed to the 2nd floor Surgery Desk To find out your arrival time, please call [phone] between 1PM - 3PM on:04-20-23 Monday If your arrival time is 6:00 am, do not arrive before that time as the [facility] entrance doors do not open until 6:00 am.  REMEMBER: Instructions that are not followed completely may result in serious medical risk, up to and including death; or upon the discretion of your surgeon and anesthesiologist your surgery may need to be rescheduled.  Do not eat food after midnight the night before surgery.  No gum chewing or hard candies.  You may however, drink Water up to 2 hours before you are scheduled to arrive for your surgery. Do not drink anything within 2 hours of your scheduled arrival time.  One week prior to surgery:Stop NOW (04-15-23) Stop Anti-inflammatories (NSAIDS) such as Advil, Aleve, Ibuprofen, Motrin, Naproxen, Naprosyn and Aspirin based products such as Excedrin, Goody's Powder, BC Powder. Stop ANY OVER THE COUNTER supplements until after surgery (Vitamin B12)  You may however, continue to take Tylenol if needed for pain up until the day of surgery.  Stop empagliflozin (JARDIANCE) 3 days prior to surgery-Last dose will be on 04-17-23 Friday  Continue taking all of your other prescription medications up until the day of surgery.  ON THE DAY OF SURGERY ONLY TAKE THESE MEDICATIONS WITH SIPS OF WATER: -amLODipine (NORVASC)   Dr Vaught's office will call you to let you know if/when you need to stop your 81 mg Aspirin once they speak to your Primary Care Provider-If you don't hear anything by tomorrow (1-22) call Dr Vaught's office   No Alcohol for 24 hours before or after surgery.  No Smoking including e-cigarettes for 24 hours before surgery.  No chewable tobacco products for at least 6 hours before surgery.  No nicotine  patches on the day of surgery.  Do not use any "recreational" drugs for at least a week (preferably 2 weeks) before your surgery.  Please be advised that the combination of cocaine and anesthesia may have negative outcomes, up to and including death. If you test positive for cocaine, your surgery will be cancelled.  On the morning of surgery brush your teeth with toothpaste and water, you may rinse your mouth with mouthwash if you wish. Do not swallow any toothpaste or mouthwash.  Do not wear jewelry, make-up, hairpins, clips or nail polish.  For welded (permanent) jewelry: bracelets, anklets, waist bands, etc.  Please have this removed prior to surgery.  If it is not removed, there is a chance that hospital personnel will need to cut it off on the day of surgery.  Do not wear lotions, powders, or perfumes.   Do not shave body hair from the neck down 48 hours before surgery.  Contact lenses, hearing aids and dentures may not be worn into surgery.  Do not bring valuables to the hospital. [facility] is not responsible for any missing/lost belongings or valuables.   Notify your doctor if there is any change in your medical condition (cold, fever, infection).  Wear comfortable clothing (specific to your surgery type) to the hospital.  After surgery, you can help prevent lung complications by doing breathing exercises.  Take deep breaths and cough every 1-2 hours. Your doctor may order a device called an Incentive Spirometer to help you take deep breaths. When coughing or sneezing, hold a pillow  firmly against your incision with both hands. This is called "splinting." Doing this helps protect your incision. It also decreases belly discomfort.  If you are being admitted to the hospital overnight, leave your suitcase in the car. After surgery it may be brought to your room.  In case of increased patient census, it may be necessary for you, the patient, to continue your postoperative care in  the Same Day Surgery department.  If you are being discharged the day of surgery, you will not be allowed to drive home. You will need a responsible individual to drive you home and stay with you for 24 hours after surgery.   If you are taking public transportation, you will need to have a responsible individual with you.  Please call the Pre-admissions Testing Dept. at [phone] if you have any questions about these instructions.  Surgery Visitation Policy:  Patients having surgery or a procedure may have two visitors.  Children under the age of 8 must have an adult with them who is not the patient.  Temporary Visitor Restrictions Due to increasing cases of flu, RSV and COVID-19: Children ages 8 and under will not be able to visit patients in [facility] hospitals under most circumstances.

## 2023-04-15 NOTE — Progress Notes (Signed)
Tried to secure chat dr Andee Poles regarding 81 mg asa. Dr Andee Poles is offline until 1-23. Reached out to Camelia Eng at dr vaughts office. She states he is out of town until tomorrow. She said she will reach out to pcp to find out when pt needs to stop and she will let pt know once she gets ok from pcp Retail banker)

## 2023-04-16 ENCOUNTER — Telehealth: Payer: Self-pay

## 2023-04-16 DIAGNOSIS — Z01818 Encounter for other preprocedural examination: Secondary | ICD-10-CM

## 2023-04-16 DIAGNOSIS — E1169 Type 2 diabetes mellitus with other specified complication: Secondary | ICD-10-CM

## 2023-04-16 DIAGNOSIS — I25119 Atherosclerotic heart disease of native coronary artery with unspecified angina pectoris: Secondary | ICD-10-CM

## 2023-04-16 DIAGNOSIS — R079 Chest pain, unspecified: Secondary | ICD-10-CM

## 2023-04-16 NOTE — Telephone Encounter (Signed)
Copied from CRM [id number]. Topic: General - Other >> Apr 16, 2023 10:37 AM Sonny Dandy B wrote: Reason for CRM: Samantha from [facility] sent over a clearance to stop medication for pt  (Aspirin) States pt is having Surgery on 04/21/23 request for Asprin to be stopped. She faxed over clearance form 04/16/23, needs it to be completed by pt's provider. Please call [phone]

## 2023-04-16 NOTE — Telephone Encounter (Signed)
Have checked s drive not in there yet

## 2023-04-17 NOTE — Telephone Encounter (Signed)
Info was printed and placed in Dr Timoteo Expose box.

## 2023-04-17 NOTE — Addendum Note (Signed)
Addended by: Eustaquio Boyden on: 04/17/2023 06:42 PM   Modules accepted: Orders

## 2023-04-17 NOTE — Telephone Encounter (Signed)
Patient is scheduled for surgery on 1/28

## 2023-04-17 NOTE — Telephone Encounter (Addendum)
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   Current blood thinner: aspirin 81mg  daily Indication for anticoagulation: CAD Bleeding risk of procedure: mild-moderate   Spoke with patient regarding risks/benefits of holding aspirin.  He stopped aspirin 81mg  on Wednesday.  He is no longer taking isosorbide - states this worsened chest discomfort.  Endorses ongoing intermittent left sided chest pains, of a chronic nature.   Notes overall good sugar control on Jardiance 25mg  and amaryl 2mg  daily.  Lab Results  Component Value Date   HGBA1C 7.9 (H) 12/31/2022    Recommend: cardiology referral for cardiac clearance prior to proceeding with surgery under general anesthesia. STAT referral placed for cardiology eval. I will forward my recommendation to ENT on Monday.

## 2023-04-20 ENCOUNTER — Encounter: Payer: Self-pay | Admitting: Family Medicine

## 2023-04-20 NOTE — Telephone Encounter (Signed)
I called pt at ~1pm - will send message to referral team about trying to expedite cardiology evaluation.

## 2023-04-20 NOTE — Telephone Encounter (Addendum)
Can we see how soon we can get patient in with cardiology in [facility] for preop evaluation? Pending ENT surgery on this.

## 2023-04-20 NOTE — Telephone Encounter (Signed)
Please see referral for updates. I have sent a message to [facility] for Urgent Appt request.

## 2023-04-21 NOTE — Telephone Encounter (Signed)
Faxed form to [facility]NT at [phone].

## 2023-04-26 NOTE — Progress Notes (Unsigned)
Cardiology Office Note  Date:  04/27/2023   ID:  Joel Bennett, DOB November 07, 1965, MRN [id number]  PCP:  Eustaquio Boyden, MD   Chief Complaint  Patient presents with   New Patient (Initial Visit)    Ref by Dr. Sharen Hones to establish care for CAD & SUSPENSION MICRODIRECT LARYNGOSCOPY WITH MICROFLAP EXCISION OF VOCAL CORD CYST; scheduled for Feb. 11, 2025.  Patient c/o chest pain that comes and goes; patient had a dull ache in chest when he first entered the exam room and has a red rash on arms and chest.    HPI:  Joel Bennett is a 58 year old gentleman with past medical history of Coronary artery disease Hyperlipidemia Diabetes type 2 Atypical chest pain Essential hypertension Morbid obesity Who presents by referral from Dr. Sharen Hones for consultation of his coronary disease, preop evaluation  First seen in 2017 for chest pain Last seen by cardiology March 2021  Prior cardiology records and imaging studies researched  records reviewed with him in detail today Myoview negative for ischemia EF 57% Continue to have chest discomfort Presenting typically with stressful situations Previously seen by cardiology 2021  Cardiac CTA November 2020 Calcium score 735 Mild nonobstructive coronary disease proximal and mid LAD  Echo December 2020 normal left ventricular function, Aortic root dilation 42 mmHg  Previously started on isosorbide for possible microvascular dysfunction Symptoms had improved on Imdur, he stopped the medication sometime back Recently restarted on isosorbide, stopped after several days given headache and other side effects  Lab work reviewed A1C 7.9 Total chol 100, LDL 40  EKG personally reviewed by myself on todays visit EKG Interpretation Date/Time:  Monday April 27 2023 11:16:20 EST Ventricular Rate:  76 PR Interval:  168 QRS Duration:  98 QT Interval:  386 QTC Calculation: 434 R Axis:   -49  Text Interpretation: Normal sinus rhythm Left axis  deviation Minimal voltage criteria for LVH, may be normal variant ( Cornell product ) When compared with ECG of 15-Apr-2023 14:07, No significant change was found Confirmed by Julien Nordmann [phone]) on 04/27/2023 11:32:17 AM    PMH:   has a past medical history of Allergic rhinitis, Allergy, Anginal pain (HCC), Anxiety, Aortic root dilation (HCC), Chronic serous otitis media of both ears, CMC arthritis, thumb, degenerative, Coronary artery disease, COVID-19 virus infection (01/2020), DDD (degenerative disc disease), lumbar, Diabetes type 2, uncontrolled (2012), Dyslipidemia, Eustachian tube dysfunction, left, Gout, History of depression (2000), History of kidney stones, Hoarseness of voice, Hypertension (1994), Low vitamin B12 level, Mild concentric left ventricular hypertrophy (LVH), Mild tricuspid regurgitation by prior echocardiogram, Polymyalgia rheumatica (HCC), Seronegative rheumatoid arthritis (HCC) (01/2019), Stool incontinence (12/2011), and Vocal fold cyst.  PSH:    Past Surgical History:  Procedure Laterality Date   ANKLE SURGERY Left 1999   BACK SURGERY  1997   Lumbar   CARDIOVASCULAR STRESS TEST  08/2015   WNL (Dr Duke Salvia)   CARPAL TUNNEL RELEASE Left 05/2019   Dr Stephenie Acres   COLONOSCOPY  05/2014   TA several, rpt 3 yrs Christella Hartigan)   COLONOSCOPY  11/2018   TA, rpt 7 yrs Christella Hartigan)   KNEE SURGERY Right 1985   MYRINGOTOMY WITH TUBE PLACEMENT Bilateral 07/23/2022   Procedure: MYRINGOTOMY WITH TUBE PLACEMENT;  Surgeon: Bud Face, MD;  Location: [facility];  Service: ENT;  Laterality: Bilateral;   SHOULDER SURGERY Left 2002   x 2   Thumb Surgery Left    Joint Fusion on left hand    Current Outpatient Medications  Medication Sig Dispense  Refill   amLODipine (NORVASC) 5 MG tablet Take 1 tablet (5 mg total) by mouth daily. (Patient taking differently: Take 5 mg by mouth every morning.) 90 tablet 4   aspirin EC 81 MG tablet Take 1 tablet (81 mg total) by mouth daily.      atorvastatin (LIPITOR) 40 MG tablet Take 1 tablet (40 mg total) by mouth daily. (Patient taking differently: Take 40 mg by mouth at bedtime.) 90 tablet 4   celecoxib (CELEBREX) 200 MG capsule Take 1 capsule (200 mg total) by mouth daily. (Patient taking differently: Take 200 mg by mouth every morning.) 30 capsule 6   empagliflozin (JARDIANCE) 25 MG TABS tablet Take 1 tablet (25 mg total) by mouth daily before breakfast. 30 tablet 11   glimepiride (AMARYL) 2 MG tablet Take 1 tablet (2 mg total) by mouth daily with breakfast. 90 tablet 4   indomethacin (INDOCIN) 50 MG capsule Take 1 capsule (50 mg total) by mouth 3 (three) times daily as needed (gout flare). 30 capsule 0   olmesartan (BENICAR) 40 MG tablet Take 1 tablet (40 mg total) by mouth daily. (Patient taking differently: Take 40 mg by mouth every morning.) 90 tablet 4   omeprazole (PRILOSEC) 20 MG capsule Take 20 mg by mouth every morning.     vitamin B-12 (CYANOCOBALAMIN) 1000 MCG tablet Take 1 tablet (1,000 mcg total) by mouth daily.     glucose blood (FREESTYLE LITE) test strip Use as instructed to check blood sugars once daily (Patient not taking: Reported on 04/27/2023) 100 each 3   No current facility-administered medications for this visit.     Allergies:   Glucophage xr [metformin hcl er], Oxycodone, and Ozempic (0.25 or 0.5 mg-dose) [semaglutide(0.25 or 0.5mg -dos)]   Social History:  The patient  reports that he has never smoked. His smokeless tobacco use includes snuff. He reports current alcohol use of about 6.0 standard drinks of alcohol per week. He reports that he does not use drugs.   Family History:   family history includes Alcohol abuse in his maternal grandfather, paternal grandfather, and paternal uncle; Atrial fibrillation in his father; CAD in his paternal grandmother and paternal uncle; CAD (age of onset: 62) in his paternal aunt; Cancer in his maternal aunt, maternal aunt, mother, paternal grandmother, and another family  member; Diabetes in his cousin and father; Rectal cancer in his maternal aunt; Stroke in his cousin and maternal grandmother.    Review of Systems: Review of Systems  Constitutional: Negative.   HENT: Negative.    Respiratory: Negative.    Cardiovascular: Negative.   Gastrointestinal: Negative.   Musculoskeletal: Negative.   Neurological: Negative.   Psychiatric/Behavioral: Negative.    All other systems reviewed and are negative.    PHYSICAL EXAM: VS:  BP 100/72 (BP Location: Right Arm, Patient Position: Sitting, Cuff Size: Large)   Pulse 76   Ht 5\' 9"  (1.753 m)   Wt 271 lb 2 oz (123 kg)   SpO2 96%   BMI 40.04 kg/m  , BMI Body mass index is 40.04 kg/m. GEN: Well nourished, well developed, in no acute distress HEENT: normal Neck: no JVD, carotid bruits, or masses Cardiac: RRR; no murmurs, rubs, or gallops,no edema  Respiratory:  clear to auscultation bilaterally, normal work of breathing GI: soft, nontender, nondistended, + BS MS: no deformity or atrophy Skin: warm and dry, no rash Neuro:  Strength and sensation are intact Psych: euthymic mood, full affect   Recent Labs: 12/31/2022: ALT 27 04/15/2023: BUN 16;  Creatinine, Ser 0.89; Hemoglobin 15.7; Platelets 175; Potassium 3.7; Sodium 137    Lipid Panel Lab Results  Component Value Date   CHOL 100 12/31/2022   HDL 35.00 (L) 12/31/2022   LDLCALC 40 12/31/2022   TRIG 127.0 12/31/2022      Wt Readings from Last 3 Encounters:  04/27/23 271 lb 2 oz (123 kg)  04/15/23 269 lb (122 kg)  01/07/23 265 lb 6 oz (120.4 kg)     ASSESSMENT AND PLAN:  Problem List Items Addressed This Visit       Cardiology Problems   Hypertension associated with diabetes (HCC)   Relevant Orders   EKG 12-Lead (Completed)   CAD (coronary artery disease), native coronary artery - Primary   Relevant Orders   EKG 12-Lead (Completed)     Other   Type 2 diabetes mellitus with diabetic neuropathy, unspecified (HCC)   Left-sided chest  pain   Relevant Orders   EKG 12-Lead (Completed)   Dyslipidemia associated with type 2 diabetes mellitus (HCC)   Preop cardiovascular evaluation Scheduled for surgery on vocal cords with ENT Acceptable risk for surgery/anesthesia, no further cardiac testing needed  Coronary artery disease with stable angina Currently with no symptoms of angina. No further workup at this time. Continue current medication regimen.  Diabetes type 2 with complications We have encouraged continued exercise, careful diet management in an effort to lose weight.  A1c trending in the 7 range  Hyperlipidemia Cholesterol is at goal on the current lipid regimen. No changes to the medications were made.  Essential hypertension Blood pressure low, continue amlodipine for antianginal effect Continue Benicar in the setting of diabetes For orthostasis symptoms could cut olmesartan in half.  Currently denies any orthostasis symptoms   Mr. Signorelli was seen in consultation for Dr. Sharen Hones and will be referred back to his office for ongoing care of the issues detailed above  Signed, Dossie Arbour, M.D., Ph.D. [facility] [place], [place] [phone]

## 2023-04-27 ENCOUNTER — Ambulatory Visit: Payer: BC Managed Care – PPO | Attending: Cardiovascular Disease | Admitting: Cardiovascular Disease

## 2023-04-27 VITALS — BP 100/72 | HR 76 | Ht 69.0 in | Wt 271.1 lb

## 2023-04-27 DIAGNOSIS — I25118 Atherosclerotic heart disease of native coronary artery with other forms of angina pectoris: Secondary | ICD-10-CM | POA: Diagnosis not present

## 2023-04-27 DIAGNOSIS — E785 Hyperlipidemia, unspecified: Secondary | ICD-10-CM

## 2023-04-27 DIAGNOSIS — I152 Hypertension secondary to endocrine disorders: Secondary | ICD-10-CM

## 2023-04-27 DIAGNOSIS — E1159 Type 2 diabetes mellitus with other circulatory complications: Secondary | ICD-10-CM

## 2023-04-27 DIAGNOSIS — E1140 Type 2 diabetes mellitus with diabetic neuropathy, unspecified: Secondary | ICD-10-CM

## 2023-04-27 DIAGNOSIS — E1169 Type 2 diabetes mellitus with other specified complication: Secondary | ICD-10-CM

## 2023-04-27 DIAGNOSIS — R079 Chest pain, unspecified: Secondary | ICD-10-CM

## 2023-04-27 NOTE — Patient Instructions (Addendum)
 Medication Instructions:  Your physician recommends that you continue on your current medications as directed. Please refer to the Current Medication list given to you today.  If you need a refill on your cardiac medications before your next appointment, please call your pharmacy.   Lab work: No new labs needed  Testing/Procedures: No new testing needed  Follow-Up: At [facility], you and your health needs are our priority.  As part of our continuing mission to provide you with exceptional heart care, we have created designated Provider Care Teams.  These Care Teams include your primary Cardiologist (physician) and Advanced Practice Providers (APPs -  Physician Assistants and Nurse Practitioners) who all work together to provide you with the care you need, when you need it.  You will need a follow up appointment in 12 months  Providers on your designated Care Team:   Nicolasa Ducking, NP Eula Listen, PA-C Cadence Fransico Michael, [place]  COVID-19 Vaccine Information can be found at: [email or web address] For questions related to vaccine distribution or appointments, please email [email or web address] or call [phone].

## 2023-05-01 ENCOUNTER — Telehealth: Payer: Self-pay | Admitting: Family Medicine

## 2023-05-01 NOTE — Telephone Encounter (Signed)
 Faxed form to [facility] at [phone]; attn:  Samantha K.   Spoke with pt relaying instructions from Dr Crissie Dome concerning holding aspirin . Pt verbalizing understanding.

## 2023-05-01 NOTE — Telephone Encounter (Addendum)
 New form received from ENT today regarding holding aspirin  prior to procedure. Reviewed cardiology note - cleared from cardiac standpoint without further cardiac testing needed.   Ok to stop aspirin  5d prior to procedure with recommencement as soon as ok by careers adviser.   Plz notify pt.

## 2023-05-05 ENCOUNTER — Other Ambulatory Visit: Payer: Self-pay

## 2023-05-05 ENCOUNTER — Encounter: Payer: Self-pay | Admitting: Otolaryngology

## 2023-05-05 ENCOUNTER — Ambulatory Visit: Payer: BC Managed Care – PPO | Admitting: Urgent Care

## 2023-05-05 ENCOUNTER — Ambulatory Visit
Admission: RE | Admit: 2023-05-05 | Discharge: 2023-05-05 | Disposition: A | Discharge status: Home / Self Care | Payer: BC Managed Care – PPO | Attending: Otolaryngology | Admitting: Otolaryngology

## 2023-05-05 ENCOUNTER — Encounter
Admission: RE | Disposition: A | Discharge status: Home / Self Care | Payer: Self-pay | Source: Home / Self Care | Attending: Otolaryngology

## 2023-05-05 DIAGNOSIS — I25119 Atherosclerotic heart disease of native coronary artery with unspecified angina pectoris: Secondary | ICD-10-CM | POA: Diagnosis not present

## 2023-05-05 DIAGNOSIS — K22711 Barrett's esophagus with high grade dysplasia: Secondary | ICD-10-CM | POA: Diagnosis not present

## 2023-05-05 DIAGNOSIS — R490 Dysphonia: Secondary | ICD-10-CM | POA: Insufficient documentation

## 2023-05-05 DIAGNOSIS — H6980 Other specified disorders of Eustachian tube, unspecified ear: Secondary | ICD-10-CM | POA: Diagnosis not present

## 2023-05-05 DIAGNOSIS — H6990 Unspecified Eustachian tube disorder, unspecified ear: Secondary | ICD-10-CM | POA: Diagnosis not present

## 2023-05-05 DIAGNOSIS — I10 Essential (primary) hypertension: Secondary | ICD-10-CM | POA: Diagnosis not present

## 2023-05-05 DIAGNOSIS — Z01818 Encounter for other preprocedural examination: Secondary | ICD-10-CM

## 2023-05-05 DIAGNOSIS — J383 Other diseases of vocal cords: Secondary | ICD-10-CM | POA: Insufficient documentation

## 2023-05-05 HISTORY — PX: DIRECT LARYNGOSCOPY: SHX5326

## 2023-05-05 LAB — GLUCOSE, CAPILLARY
Glucose-Capillary: 167 mg/dL — ABNORMAL HIGH (ref 70–99)
Glucose-Capillary: 160 mg/dL — ABNORMAL HIGH (ref 70–99)

## 2023-05-05 SURGERY — LARYNGOSCOPY, DIRECT
Anesthesia: General | Laterality: Left

## 2023-05-05 MED ORDER — SUGAMMADEX SODIUM 200 MG/2ML IV SOLN
INTRAVENOUS | Status: DC | PRN
  Administered 2023-05-05: 400 mg via INTRAVENOUS

## 2023-05-05 MED ORDER — ONDANSETRON HCL 4 MG/2ML IJ SOLN
INTRAMUSCULAR | Status: AC
  Filled 2023-05-05: qty 2

## 2023-05-05 MED ORDER — CHLORHEXIDINE GLUCONATE 0.12 % MT SOLN
OROMUCOSAL | Status: AC
  Filled 2023-05-05: qty 15

## 2023-05-05 MED ORDER — CIPROFLOXACIN-DEXAMETHASONE 0.3-0.1 % OT SUSP
OTIC | Status: AC
  Filled 2023-05-05: qty 7.5

## 2023-05-05 MED ORDER — PHENYLEPHRINE 80 MCG/ML (10ML) SYRINGE FOR IV PUSH (FOR BLOOD PRESSURE SUPPORT)
PREFILLED_SYRINGE | INTRAVENOUS | Status: DC | PRN
  Administered 2023-05-05: 80 ug via INTRAVENOUS

## 2023-05-05 MED ORDER — ORAL CARE MOUTH RINSE: 15.0000 mL | Freq: Once | OROMUCOSAL | Status: AC

## 2023-05-05 MED ORDER — SODIUM CHLORIDE 0.9 % IV SOLN
INTRAVENOUS | Status: DC
Start: 2023-05-05 — End: 2023-05-05

## 2023-05-05 MED ORDER — ROCURONIUM BROMIDE 10 MG/ML (PF) SYRINGE
PREFILLED_SYRINGE | INTRAVENOUS | Status: AC
  Filled 2023-05-05: qty 10

## 2023-05-05 MED ORDER — ACETAMINOPHEN 10 MG/ML IV SOLN
INTRAVENOUS | Status: DC | PRN
  Administered 2023-05-05: 1000 mg via INTRAVENOUS

## 2023-05-05 MED ORDER — LIDOCAINE HCL 4 % EX SOLN
CUTANEOUS | Status: DC | PRN
Start: 2023-05-05 — End: 2023-05-05
  Administered 2023-05-05: 4 mL via TOPICAL

## 2023-05-05 MED ORDER — FENTANYL CITRATE (PF) 100 MCG/2ML IJ SOLN
INTRAMUSCULAR | Status: AC
  Filled 2023-05-05: qty 2

## 2023-05-05 MED ORDER — LIDOCAINE HCL (CARDIAC) PF 100 MG/5ML IV SOSY
PREFILLED_SYRINGE | INTRAVENOUS | Status: DC | PRN
  Administered 2023-05-05: 100 mg via INTRAVENOUS

## 2023-05-05 MED ORDER — LIDOCAINE HCL (PF) 2 % IJ SOLN
INTRAMUSCULAR | Status: AC
  Filled 2023-05-05: qty 5

## 2023-05-05 MED ORDER — ONDANSETRON HCL 4 MG PO TABS: 4.0000 mg | ORAL_TABLET | Freq: Three times a day (TID) | ORAL | 0 refills | Status: DC | PRN

## 2023-05-05 MED ORDER — EPHEDRINE SULFATE-NACL 50-0.9 MG/10ML-% IV SOSY
PREFILLED_SYRINGE | INTRAVENOUS | Status: DC | PRN
Start: 2023-05-05 — End: 2023-05-05
  Administered 2023-05-05: 2.5 mg via INTRAVENOUS

## 2023-05-05 MED ORDER — EPINEPHRINE PF 1 MG/ML IJ SOLN
INTRAMUSCULAR | Status: AC
  Filled 2023-05-05: qty 1

## 2023-05-05 MED ORDER — MIDAZOLAM HCL 2 MG/2ML IJ SOLN
INTRAMUSCULAR | Status: AC
  Filled 2023-05-05: qty 2

## 2023-05-05 MED ORDER — OXYMETAZOLINE HCL 0.05 % NA SOLN
NASAL | Status: AC
  Filled 2023-05-05: qty 30

## 2023-05-05 MED ORDER — EPINEPHRINE 1 MG/10ML IJ SOSY
PREFILLED_SYRINGE | INTRAMUSCULAR | Status: DC | PRN
Start: 2023-05-05 — End: 2023-05-05
  Administered 2023-05-05: 1 mg via ENDOTRACHEOPULMONARY

## 2023-05-05 MED ORDER — DEXAMETHASONE SODIUM PHOSPHATE 10 MG/ML IJ SOLN
INTRAMUSCULAR | Status: DC | PRN
  Administered 2023-05-05: 5 mg via INTRAVENOUS

## 2023-05-05 MED ORDER — ROCURONIUM BROMIDE 100 MG/10ML IV SOLN
INTRAVENOUS | Status: DC | PRN
  Administered 2023-05-05: 60 mg via INTRAVENOUS
  Administered 2023-05-05: 20 mg via INTRAVENOUS

## 2023-05-05 MED ORDER — CHLORHEXIDINE GLUCONATE 0.12 % MT SOLN
15.0000 mL | Freq: Once | OROMUCOSAL | Status: AC
  Administered 2023-05-05: 15 mL via OROMUCOSAL

## 2023-05-05 MED ORDER — LACTATED RINGERS IV SOLN: INTRAVENOUS | Status: DC | PRN

## 2023-05-05 MED ORDER — FENTANYL CITRATE (PF) 100 MCG/2ML IJ SOLN
INTRAMUSCULAR | Status: DC | PRN
  Administered 2023-05-05 (×2): 50 ug via INTRAVENOUS

## 2023-05-05 MED ORDER — DEXMEDETOMIDINE HCL IN NACL 80 MCG/20ML IV SOLN
INTRAVENOUS | Status: DC | PRN
  Administered 2023-05-05 (×2): 8 ug via INTRAVENOUS

## 2023-05-05 MED ORDER — ONDANSETRON HCL 4 MG/2ML IJ SOLN
INTRAMUSCULAR | Status: DC | PRN
  Administered 2023-05-05: 4 mg via INTRAVENOUS

## 2023-05-05 MED ORDER — DEXAMETHASONE SODIUM PHOSPHATE 10 MG/ML IJ SOLN
INTRAMUSCULAR | Status: AC
  Filled 2023-05-05: qty 1

## 2023-05-05 MED ORDER — DROPERIDOL 2.5 MG/ML IJ SOLN: 0.6250 mg | Freq: Once | INTRAMUSCULAR | Status: DC | PRN

## 2023-05-05 MED ORDER — MIDAZOLAM HCL 2 MG/2ML IJ SOLN
INTRAMUSCULAR | Status: DC | PRN
  Administered 2023-05-05: 2 mg via INTRAVENOUS

## 2023-05-05 MED ORDER — FENTANYL CITRATE (PF) 100 MCG/2ML IJ SOLN: 25.0000 ug | INTRAMUSCULAR | Status: DC | PRN

## 2023-05-05 MED ORDER — PROPOFOL 10 MG/ML IV BOLUS
INTRAVENOUS | Status: AC
  Filled 2023-05-05: qty 40

## 2023-05-05 MED ORDER — HYDROCODONE-ACETAMINOPHEN 7.5-325 MG/15ML PO SOLN: 10.0000 mL | Freq: Four times a day (QID) | ORAL | 0 refills | Status: DC | PRN

## 2023-05-05 MED ORDER — ACETAMINOPHEN 10 MG/ML IV SOLN
INTRAVENOUS | Status: AC
  Filled 2023-05-05: qty 100

## 2023-05-05 MED ORDER — PROPOFOL 10 MG/ML IV BOLUS
INTRAVENOUS | Status: DC | PRN
Start: 2023-05-05 — End: 2023-05-05
  Administered 2023-05-05: 200 mg via INTRAVENOUS

## 2023-05-05 SURGICAL SUPPLY — 21 items
ANTIFOG SOL W/FOAM PAD STRL (MISCELLANEOUS) ×2 IMPLANT
BASIN GRAD PLASTIC 32OZ STRL (MISCELLANEOUS) ×3 IMPLANT
BLADE MYR LANCE NRW W/HDL (BLADE) ×3 IMPLANT
COTTON BALL STERILE (GAUZE/BANDAGES/DRESSINGS)
COTTON BALL STERILE 4 PK (GAUZE/BANDAGES/DRESSINGS) ×3 IMPLANT
COVER MAYO STAND STRL (DRAPES) ×3 IMPLANT
CUP MEDICINE 2OZ PLAST GRAD ST (MISCELLANEOUS) ×3 IMPLANT
DRAPE TABLE BACK 80X90 (DRAPES) ×3 IMPLANT
DRSG TELFA 3X4 N-ADH STERILE (GAUZE/BANDAGES/DRESSINGS) ×3 IMPLANT
GAUZE 4X4 16PLY ~~LOC~~+RFID DBL (SPONGE) ×3 IMPLANT
GLOVE BIO SURGEON STRL SZ7.5 (GLOVE) ×3 IMPLANT
GOWN STRL REUS W/ TWL LRG LVL3 (GOWN DISPOSABLE) ×6 IMPLANT
MANIFOLD NEPTUNE II (INSTRUMENTS) ×3 IMPLANT
NDL SAFETY ECLIPSE 18X1.5 (NEEDLE) ×3 IMPLANT
PATTIES SURGICAL .5 X.5 (GAUZE/BANDAGES/DRESSINGS) ×3 IMPLANT
SOLUTION ANTFG W/FOAM PAD STRL (MISCELLANEOUS) ×3 IMPLANT
TOWEL OR 17X26 4PK STRL BLUE (TOWEL DISPOSABLE) ×3 IMPLANT
TRAP FLUID SMOKE EVACUATOR (MISCELLANEOUS) ×3 IMPLANT
TUBE EAR VENT ARMSTRNG FM 1.14 (TUBING) IMPLANT
TUBING CONNECTING 10 (TUBING) ×3 IMPLANT
WATER STERILE IRR 500ML POUR (IV SOLUTION) ×3 IMPLANT

## 2023-05-05 NOTE — Anesthesia Preprocedure Evaluation (Signed)
Anesthesia Evaluation  Patient identified by MRN, date of birth, ID band Patient awake    Reviewed: Allergy & Precautions, H&P , NPO status , Patient's Chart, lab work & pertinent test results  Airway Mallampati: IV  TM Distance: <3 FB Neck ROM: Full    Dental  (+) Poor Dentition, Dental Advidsory Given, Missing, Chipped   Pulmonary neg pulmonary ROS   Pulmonary exam normal breath sounds clear to auscultation       Cardiovascular hypertension, + angina (stable angina, seen by cards)  + CAD  (-) Past MI and (-) Cardiac Stents Normal cardiovascular exam(-) dysrhythmias (-) Valvular Problems/Murmurs Rhythm:Regular Rate:Normal  Echo 03-17-19 EF 60-65%, mild LVH, LA mildly dilated, mild dilation aortic root, 4.2 cm. Mild dilation ascending aorta 3.6, normal PASP, mild TR   Neuro/Psych neg Seizures  Anxiety      Neuromuscular disease  negative psych ROS   GI/Hepatic Neg liver ROS,GERD  ,,  Endo/Other  diabetes, Type 2, Oral Hypoglycemic Agents  Class 3 obesity  Renal/GU negative Renal ROS  negative genitourinary   Musculoskeletal negative musculoskeletal ROS (+) Arthritis , Osteoarthritis,    Abdominal  (+) + obese  Peds negative pediatric ROS (+)  Hematology negative hematology ROS (+)   Anesthesia Other Findings Diabetes type 2, uncontrolled Hypertension DDD (degenerative disc disease History of depression Allergic rhinitis  Stool incontinence  Allergy Diabetes mellitus without complication (HCC)  Seronegative rheumatoid arthritis (HCC) COVID-19 virus infection gout     Reproductive/Obstetrics negative OB ROS                             Anesthesia Physical Anesthesia Plan  ASA: 3  Anesthesia Plan: General   Post-op Pain Management:    Induction: Intravenous  PONV Risk Score and Plan: 2 and Ondansetron, Dexamethasone, Treatment may vary due to age or medical condition and  Midazolam  Airway Management Planned: Oral ETT  Additional Equipment:   Intra-op Plan:   Post-operative Plan: Extubation in OR  Informed Consent: I have reviewed the patients History and Physical, chart, labs and discussed the procedure including the risks, benefits and alternatives for the proposed anesthesia with the patient or authorized representative who has indicated his/her understanding and acceptance.     Dental Advisory Given  Plan Discussed with: Anesthesiologist, CRNA and Surgeon  Anesthesia Plan Comments: (Patient consented for risks of anesthesia including but not limited to:  - adverse reactions to medications - damage to eyes, teeth, lips or other oral mucosa - nerve damage due to positioning  - sore throat or hoarseness - Damage to heart, brain, nerves, lungs, other parts of body or loss of life  Patient voiced understanding.)        Anesthesia Quick Evaluation

## 2023-05-05 NOTE — Op Note (Signed)
...  05/05/2023  11:28 AM    Joel Bennett  [id number]   Pre-Op Dx:  Vocal fold cyst, dysphonia, horseness, eustachian tube dysfunction  Post-op Dx: Vocal fold cyst, dysphonia, horseness, eustachian tube dysfunction  Proc:   1) Suspension Microlaryngoscopy with microflap excision of left anterior vocal fold mass  2) Examination of left ear under anesthesia   Surg: Joel Bennett  Anes:  General Endotracheal  EBL:  <69ml  Comp:  None  Findings:  Left PE tube in place and secure with no evidence of attempted extrusion.  Tube left in place due to no issues.  Left anterior vocal fold mass, not a cyst removed from vocal fold via microflap excision.  Procedure: After the patient was identified in holding and the history and physical and consent was reviewed, the patient was taken to the operating room and placed in a supine position.  General endotracheal anesthesia was induced in the normal fashion.  At an appropriate level, microscope and speculum were used to examine and clean the LEFT ear canal.  The findings were as described above.  This revealed a PE in place in anterior inferior aspect of the TM.  No extrusion was noted and it was in good position.  Therefore no further work was done on the ear.  At this time, the patient was rotated 90 degrees and a shoulder roll was placed.  At this time, a mouth guard was placed into the patient's oral cavity.  A Dedo laryngoscope was inserted into the oral cavity and advanced for visualization of the patient's oropharynx, pharynx, and larynx.  This demonstrated a left anterior vocal fold mass vs cyst.  The dedo was placed into suspection.  A magnified hopkins rod was brought onto the field 30 degree, however the rod was malfunctioning without good light illumination.  A -0 degree was brought onto the field and this did not allow good visualization of the anterior portion of the vocal fold mass given patient's anatomy.  Therefore the Dedo was  replaced with an anterior commisure laryngoscope that allowed good visualization of the patient's anterior glottis with a zero degree rod.  This was placed into suspension.  Under high power magnification using a hopkins rod, a sickle knife was used to make an incision on the dorsal aspect of the left vocal fold mass just adjacent to the mass.  Using a micro-dissector the mass was dissected in the superficial layer of the lamina propria.  Care was taken to avoid injury to the vocalis muscle.  Once this was dissected away a 90 degree upbiting scissors was used to remove the inferior mucosal connection to the vocal fold mass.  The mass was then grasped with cup forceps and carefully removed.  All visible aspect of the mass were removed.  The residual mucosa was draped over the anterior vocal fold.  Topical epinephrine soaked pledgets were used for hemostasis.  At this time, the patient was release from suspension and the laryngoscope was removed from the patient's oral cavity without injury to teeth, lips, or gums.  The mouth gag was removed as well.   Following this  The care of patient was returned to anesthesia, awakened, and transferred to recovery in stable condition.  Dispo:  PACU to home  Plan: Voice rest for 5 days.  Follow up in 10 days.   Joel Bennett 11:28 AM 05/05/2023 .

## 2023-05-05 NOTE — H&P (Signed)
..  History and Physical paper copy reviewed and updated date of procedure and will be scanned into system.  Patient seen and examined.  Left ear marked.

## 2023-05-05 NOTE — Anesthesia Procedure Notes (Signed)
Procedure Name: Intubation Date/Time: 05/05/2023 10:48 AM  Performed by: Pricilla Handler, RNPre-anesthesia Checklist: Patient identified, Emergency Drugs available, Suction available and Patient being monitored Patient Re-evaluated:Patient Re-evaluated prior to induction Oxygen Delivery Method: Circle system utilized Preoxygenation: Pre-oxygenation with 100% oxygen Induction Type: IV induction Ventilation: Two handed mask ventilation required and Oral airway inserted - appropriate to patient size Laryngoscope Size: McGrath and 4 Grade View: Grade I Tube type: MLT Tube size: 6.0 mm Number of attempts: 1 Airway Equipment and Method: Stylet and Oral airway Placement Confirmation: ETT inserted through vocal cords under direct vision, positive ETCO2 and breath sounds checked- equal and bilateral Secured at: 22 cm Tube secured with: Tape Dental Injury: Teeth and Oropharynx as per pre-operative assessment

## 2023-05-05 NOTE — Transfer of Care (Signed)
Immediate Anesthesia Transfer of Care Note  Patient: Joel Bennett  Procedure(s) Performed: SUSPENSION MICRODIRECT LARYNGOSCOPY WITH MICROFLAP EXCISION OF VOCAL CORD CYST (Left) EAR EXAM UNDER ANESTHESIA  Patient Location: PACU  Anesthesia Type:General  Level of Consciousness: alert , oriented, and drowsy  Airway & Oxygen Therapy: Patient Spontanous Breathing and Patient connected to face mask  Post-op Assessment: Report given to RN, Post -op Vital signs reviewed and stable, and Patient moving all extremities  Post vital signs: Reviewed and stable  Last Vitals:  Vitals Value Taken Time  BP 112/73 05/05/23 1137  Temp    Pulse 67 05/05/23 1140  Resp 10 05/05/23 1140  SpO2 99 % 05/05/23 1140  Vitals shown include unfiled device data.  Last Pain:  Vitals:   05/05/23 1023  TempSrc:   PainSc: 0-No pain         Complications: No notable events documented.

## 2023-05-06 ENCOUNTER — Encounter: Payer: Self-pay | Admitting: Otolaryngology

## 2023-05-06 LAB — SURGICAL PATHOLOGY

## 2023-05-06 NOTE — Anesthesia Postprocedure Evaluation (Signed)
Anesthesia Post Note  Patient: Joel Bennett  Procedure(s) Performed: SUSPENSION MICRODIRECT LARYNGOSCOPY WITH MICROFLAP EXCISION OF VOCAL CORD CYST (Left) EAR EXAM UNDER ANESTHESIA  Patient location during evaluation: PACU Anesthesia Type: General Level of consciousness: awake and alert Pain management: pain level controlled Vital Signs Assessment: post-procedure vital signs reviewed and stable Respiratory status: spontaneous breathing, nonlabored ventilation, respiratory function stable and patient connected to nasal cannula oxygen Cardiovascular status: blood pressure returned to baseline and stable Postop Assessment: no apparent nausea or vomiting Anesthetic complications: no   No notable events documented.   Last Vitals:  Vitals:   05/05/23 1237 05/05/23 1242  BP:  (!) 142/88  Pulse: 68   Resp: 18   Temp: (!) 36.3 C   SpO2: 98%     Last Pain:  Vitals:   05/06/23 0836  TempSrc:   PainSc: 0-No pain                 Lenard Simmer

## 2023-05-18 DIAGNOSIS — D380 Neoplasm of uncertain behavior of larynx: Secondary | ICD-10-CM | POA: Diagnosis not present

## 2023-05-18 DIAGNOSIS — R490 Dysphonia: Secondary | ICD-10-CM | POA: Diagnosis not present

## 2023-06-15 DIAGNOSIS — H6983 Other specified disorders of Eustachian tube, bilateral: Secondary | ICD-10-CM | POA: Diagnosis not present

## 2023-06-15 DIAGNOSIS — D141 Benign neoplasm of larynx: Secondary | ICD-10-CM | POA: Diagnosis not present

## 2023-08-09 NOTE — Progress Notes (Signed)
 Joel Bennett T. [name], MD, CAQ Sports [facility] [address] [place], [address]  Phone: [phone]  FAX: [phone]  Joel Bennett - 58 y.o. male  MRN [id number]  Date of Birth: Jan 29, 1966  Date: 08/10/2023  PCP: Claire Crick, MD  Referral: Claire Crick, MD  Chief Complaint  Patient presents with   Hand Pain    Bilateral-Twitches/weak   Subjective:   Joel Bennett is a 59 y.o. very pleasant male patient with Body mass index is 40.96 kg/m. who presents with the following:  RHD  He is a very nice gentleman, I have seen him in a number of times over the years with polyarthralgia.  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  In 2019 he had a markedly elevated CRP and ESR with other normal rheumatological levels, and was treated for polymyalgia rheumatica.  He has been having some intermittent twitching and involuntary twitching of some fingers, predominantly in the second digits bilaterally. He does know he has known right-sided carpal tunnel syndrome He has used some splints in the past, but not recently.  Around the first digit, he also has some mild decrease sensation  Off and on for a year  Rheum - [place] Rheum - not seen in a few years He does have a history of seronegative rheumatoid arthritis  Fingers are tingling and jumping  Lab Results  Component Value Date   HGBA1C 7.9 (H) 12/31/2022    Cbc Esr Hfp Methylmalonic acid b12  Review of Systems is noted in the HPI, as appropriate  Objective:   BP 116/80   Pulse 79   Temp 98 F (36.7 C) (Temporal)   Ht 5' 8.25" (1.734 m)   Wt 271 lb 6 oz (123.1 kg)   SpO2 98%   BMI 40.96 kg/m   GEN: No acute distress; alert,appropriate. PULM: Breathing comfortably in no respiratory distress PSYCH: Normally interactive.   He does  have some mild restriction of motion in the hands bilaterally with the fingers There is some mildly tender in boggy MCPs with some tenderness in the DIPs On the volar aspect of the hand, more on the right there is some mild decrease sensation Grip on the right is decreased  Wrist extension, flexion, elbow flexion and extension as well as the shoulder has full range of motion and strength.  He does have some mild movement at the second digit bilaterally trace  Laboratory and Imaging Data:  Assessment and Plan:     ICD-10-CM   1. Numbness and tingling in both hands  R20.0 CBC with Differential/Platelet   R20.2 Comprehensive metabolic panel with GFR    Sedimentation rate    Methylmalonic Acid, Serum    Vitamin B12    2. Weakness of right hand  R29.898 CBC with Differential/Platelet    Comprehensive metabolic panel with GFR    Sedimentation rate    Methylmalonic Acid, Serum    Vitamin B12     Not completely sure what could be causing the numbness, tingling, as well as the jumpiness of the second digits bilaterally He does have some weakness in the right hand with the grip, as well.  Remainder of basic neuropathy/neuropathic pain workup with some labs to see if there is anything acute or addressable.  He does have known carpal tunnel on the right and this would include the second  digit in terms of involvement.  I am going to have him use his carpal tunnel braces for the next 2 weeks.  If the entire workup as well as he has no improvement with using his braces, I think involving neurology for additional testing, possible EMG and NCV would be reasonable.  Medication Management during today's office visit: No orders of the defined types were placed in this encounter.  Medications Discontinued During This Encounter  Medication Reason   HYDROcodone -acetaminophen  (HYCET) 7.5-325 mg/15 ml solution Completed Course   ondansetron  (ZOFRAN ) 4 MG tablet Completed Course    Orders placed  today for conditions managed today: Orders Placed This Encounter  Procedures   CBC with Differential/Platelet   Comprehensive metabolic panel with GFR   Sedimentation rate   Methylmalonic Acid, Serum   Vitamin B12    Disposition: No follow-ups on file.  [employer] One speech-to-text software was used for transcription in this dictation.  Possible transcriptional errors can occur using Animal nutritionist.   Signed,  Ranny Bye. Yuta Cipollone, MD   Outpatient Encounter Medications as of 08/10/2023  Medication Sig   amLODipine  (NORVASC ) 5 MG tablet Take 1 tablet (5 mg total) by mouth daily.   aspirin  EC 81 MG tablet Take 1 tablet (81 mg total) by mouth daily.   atorvastatin  (LIPITOR) 40 MG tablet Take 1 tablet (40 mg total) by mouth daily.   celecoxib  (CELEBREX ) 200 MG capsule Take 1 capsule (200 mg total) by mouth daily.   empagliflozin  (JARDIANCE ) 25 MG TABS tablet Take 1 tablet (25 mg total) by mouth daily before breakfast.   glimepiride  (AMARYL ) 2 MG tablet Take 1 tablet (2 mg total) by mouth daily with breakfast.   glucose blood (FREESTYLE LITE) test strip Use as instructed to check blood sugars once daily   indomethacin  (INDOCIN ) 50 MG capsule Take 1 capsule (50 mg total) by mouth 3 (three) times daily as needed (gout flare).   olmesartan  (BENICAR ) 40 MG tablet Take 1 tablet (40 mg total) by mouth daily.   omeprazole (PRILOSEC) 20 MG capsule Take 20 mg by mouth every morning.   vitamin B-12 (CYANOCOBALAMIN ) 1000 MCG tablet Take 1 tablet (1,000 mcg total) by mouth daily.   [DISCONTINUED] HYDROcodone -acetaminophen  (HYCET) 7.5-325 mg/15 ml solution Take 10 mLs by mouth 4 (four) times daily as needed for moderate pain (pain score 4-6).   [DISCONTINUED] ondansetron  (ZOFRAN ) 4 MG tablet Take 1 tablet (4 mg total) by mouth every 8 (eight) hours as needed for nausea or vomiting.   No facility-administered encounter medications on file as of 08/10/2023.

## 2023-08-10 ENCOUNTER — Ambulatory Visit (INDEPENDENT_AMBULATORY_CARE_PROVIDER_SITE_OTHER): Payer: Self-pay | Admitting: Family Medicine

## 2023-08-10 ENCOUNTER — Encounter: Payer: Self-pay | Admitting: Family Medicine

## 2023-08-10 VITALS — BP 116/80 | HR 79 | Temp 98.0°F | Ht 68.25 in | Wt 271.4 lb

## 2023-08-10 DIAGNOSIS — R29898 Other symptoms and signs involving the musculoskeletal system: Secondary | ICD-10-CM | POA: Diagnosis not present

## 2023-08-10 DIAGNOSIS — R200 Anesthesia of skin: Secondary | ICD-10-CM

## 2023-08-10 DIAGNOSIS — R202 Paresthesia of skin: Secondary | ICD-10-CM | POA: Diagnosis not present

## 2023-08-11 ENCOUNTER — Encounter: Payer: Self-pay | Admitting: Family Medicine

## 2023-08-11 LAB — CBC WITH DIFFERENTIAL/PLATELET
Basophils Absolute: 0 K/uL (ref 0.0–0.1)
Basophils Relative: 0.5 % (ref 0.0–3.0)
Eosinophils Absolute: 0.2 K/uL (ref 0.0–0.7)
Eosinophils Relative: 3.3 % (ref 0.0–5.0)
HCT: 45 % (ref 39.0–52.0)
Hemoglobin: 14.8 g/dL (ref 13.0–17.0)
Lymphocytes Relative: 29.6 % (ref 12.0–46.0)
Lymphs Abs: 2 K/uL (ref 0.7–4.0)
MCHC: 33 g/dL (ref 30.0–36.0)
MCV: 98.4 fl (ref 78.0–100.0)
Monocytes Absolute: 0.7 K/uL (ref 0.1–1.0)
Monocytes Relative: 9.5 % (ref 3.0–12.0)
Neutro Abs: 4 K/uL (ref 1.4–7.7)
Neutrophils Relative %: 57.1 % (ref 43.0–77.0)
Platelets: 151 K/uL (ref 150.0–400.0)
RBC: 4.58 Mil/uL (ref 4.22–5.81)
RDW: 14.8 % (ref 11.5–15.5)
WBC: 6.9 K/uL (ref 4.0–10.5)

## 2023-08-11 LAB — VITAMIN B12: Vitamin B-12: 387 pg/mL (ref 211–911)

## 2023-08-11 LAB — COMPREHENSIVE METABOLIC PANEL WITH GFR
ALT: 26 U/L (ref 0–53)
AST: 22 U/L (ref 0–37)
Albumin: 4.4 g/dL (ref 3.5–5.2)
Alkaline Phosphatase: 56 U/L (ref 39–117)
BUN: 19 mg/dL (ref 6–23)
CO2: 24 meq/L (ref 19–32)
Calcium: 9.3 mg/dL (ref 8.4–10.5)
Chloride: 101 meq/L (ref 96–112)
Creatinine, Ser: 0.87 mg/dL (ref 0.40–1.50)
GFR: 94.47 mL/min
Glucose, Bld: 124 mg/dL — ABNORMAL HIGH (ref 70–99)
Potassium: 4.1 meq/L (ref 3.5–5.1)
Sodium: 137 meq/L (ref 135–145)
Total Bilirubin: 0.4 mg/dL (ref 0.2–1.2)
Total Protein: 7.1 g/dL (ref 6.0–8.3)

## 2023-08-11 LAB — SEDIMENTATION RATE: Sed Rate: 8 mm/h (ref 0–20)

## 2023-08-13 LAB — METHYLMALONIC ACID, SERUM: Methylmalonic Acid, Quant: 135 nmol/L (ref 55–335)

## 2023-08-14 ENCOUNTER — Ambulatory Visit: Payer: Self-pay | Admitting: Family Medicine

## 2023-09-29 ENCOUNTER — Encounter: Payer: Self-pay | Admitting: Family Medicine

## 2023-09-29 DIAGNOSIS — R29898 Other symptoms and signs involving the musculoskeletal system: Secondary | ICD-10-CM

## 2023-09-29 DIAGNOSIS — R200 Anesthesia of skin: Secondary | ICD-10-CM

## 2023-10-27 ENCOUNTER — Other Ambulatory Visit: Payer: Self-pay

## 2023-10-27 DIAGNOSIS — M0600 Rheumatoid arthritis without rheumatoid factor, unspecified site: Secondary | ICD-10-CM

## 2023-10-27 NOTE — Telephone Encounter (Signed)
 Celebrex  Last filled:  09/22/23, #30 Last OV:  02/07/23, CPE Next OV:  none

## 2023-10-29 ENCOUNTER — Encounter: Payer: Self-pay | Admitting: Family Medicine

## 2023-10-29 MED ORDER — CELECOXIB 200 MG PO CAPS: 200.0000 mg | ORAL_CAPSULE | Freq: Every day | ORAL | 6 refills | Status: DC

## 2023-10-29 NOTE — Addendum Note (Signed)
 Addended by: RILLA BALLER on: 10/29/2023 12:46 PM   Modules accepted: Orders

## 2023-10-29 NOTE — Telephone Encounter (Signed)
 See 10/27/23 refill note.

## 2024-01-19 ENCOUNTER — Encounter: Payer: Self-pay | Admitting: Family Medicine

## 2024-01-22 ENCOUNTER — Ambulatory Visit: Admitting: Family Medicine

## 2024-01-22 ENCOUNTER — Encounter: Payer: Self-pay | Admitting: Family Medicine

## 2024-01-22 VITALS — BP 138/82 | HR 79 | Temp 98.0°F | Ht 68.25 in | Wt 272.0 lb

## 2024-01-22 DIAGNOSIS — R21 Rash and other nonspecific skin eruption: Secondary | ICD-10-CM | POA: Insufficient documentation

## 2024-01-22 DIAGNOSIS — L03012 Cellulitis of left finger: Secondary | ICD-10-CM | POA: Diagnosis not present

## 2024-01-22 DIAGNOSIS — S61209A Unspecified open wound of unspecified finger without damage to nail, initial encounter: Secondary | ICD-10-CM | POA: Diagnosis not present

## 2024-01-22 MED ORDER — CEPHALEXIN 500 MG PO CAPS: 500.0000 mg | ORAL_CAPSULE | Freq: Three times a day (TID) | ORAL | 0 refills | Status: DC

## 2024-01-22 MED ORDER — TRIAMCINOLONE ACETONIDE 0.5 % EX CREA: 1.0000 | TOPICAL_CREAM | Freq: Two times a day (BID) | CUTANEOUS | 1 refills | Status: AC

## 2024-01-22 NOTE — Assessment & Plan Note (Signed)
 Acute, likely cellulitis at site of wound in a diabetic. No history of MRSA. Will treat with Keflex 500 mg 3 times daily x 7 days.  Return and ER precautions reviewed in detail.  Patient will call if redness spreading, fever on antibiotics or antibiotic intolerance.

## 2024-01-22 NOTE — Progress Notes (Signed)
 Patient ID: Joel Bennett, male    DOB: 11-08-1965, 58 y.o.   MRN: [id number]  This visit was conducted in person.  BP 138/82   Pulse 79   Temp 98 F (36.7 C) (Temporal)   Ht 5' 8.25 (1.734 m)   Wt 272 lb (123.4 kg)   SpO2 94%   BMI 41.06 kg/m    CC:  Chief Complaint  Patient presents with   Hand Pain    Pt here for infectected cut on L finger and a rash on both legs.  Rash started 2-4 weeks. Cut on the finger started last Wednesday.    Subjective:   HPI: Joel Bennett is a 58 y.o. male presenting on 01/22/2024 for Hand Pain (Pt here for infectected cut on L finger and a rash on both legs. /Rash started 2-4 weeks. Cut on the finger started last Wednesday.)  New onset rash on  bilateral lower legs x 2-4 weeks. 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  No new exposures, no new meds.  No swelling change  Has tried benadryl  cream,. Cortisone cream. Has had similar rash in past... went away after a few days with cortisone cream.  Cut left finger  1 week ago, caught pointer finger  on wood splitter and tore off chunk of skin. Washed it, applied neosporin and washing with peroxide.  Since then he has noted spreading redness at edge, no discharge, no odor.  Increasing in pain  No fever or flu like illness.   Has diabetes , moderate control Lab Results  Component Value Date   HGBA1C 7.9 (H) 12/31/2022     No MRSA history.      Relevant past medical, surgical, family and social history reviewed and updated as indicated. Interim medical history since our last visit reviewed. Allergies and medications reviewed and updated. Outpatient Medications Prior to Visit  Medication Sig Dispense Refill   amLODipine  (NORVASC ) 5 MG tablet Take 1 tablet (5 mg total) by mouth daily. 90 tablet 4   aspirin  EC 81 MG tablet Take 1 tablet (81 mg total) by mouth daily.     atorvastatin  (LIPITOR) 40 MG tablet Take 1 tablet (40 mg total) by mouth  daily. 90 tablet 4   celecoxib  (CELEBREX ) 200 MG capsule Take 1 capsule (200 mg total) by mouth daily. 30 capsule 6   empagliflozin  (JARDIANCE ) 25 MG TABS tablet Take 1 tablet (25 mg total) by mouth daily before breakfast. 30 tablet 11   glimepiride  (AMARYL ) 2 MG tablet Take 1 tablet (2 mg total) by mouth daily with breakfast. 90 tablet 4   glucose blood (FREESTYLE LITE) test strip Use as instructed to check blood sugars once daily 100 each 3   indomethacin  (INDOCIN ) 50 MG capsule Take 1 capsule (50 mg total) by mouth 3 (three) times daily as needed (gout flare). 30 capsule 0   olmesartan  (BENICAR ) 40 MG tablet Take 1 tablet (40 mg total) by mouth daily. 90 tablet 4   omeprazole (PRILOSEC) 20 MG capsule Take 20 mg by mouth every morning.     vitamin B-12 (CYANOCOBALAMIN ) 1000 MCG tablet Take 1 tablet (1,000 mcg total) by mouth daily.     No facility-administered medications prior to visit.     Per HPI unless specifically indicated in ROS section below Review of Systems  Constitutional:  Negative for fatigue and fever.  HENT:  Negative for ear pain.   Eyes:  Negative for pain.  Respiratory:  Negative  for cough and shortness of breath.   Cardiovascular:  Negative for chest pain, palpitations and leg swelling.  Gastrointestinal:  Negative for abdominal pain.  Genitourinary:  Negative for dysuria.  Musculoskeletal:  Negative for arthralgias.  Neurological:  Negative for syncope, light-headedness and headaches.  Psychiatric/Behavioral:  Negative for dysphoric mood.    Objective:  BP 138/82   Pulse 79   Temp 98 F (36.7 C) (Temporal)   Ht 5' 8.25 (1.734 m)   Wt 272 lb (123.4 kg)   SpO2 94%   BMI 41.06 kg/m   Wt Readings from Last 3 Encounters:  01/22/24 272 lb (123.4 kg)  08/10/23 271 lb 6 oz (123.1 kg)  04/27/23 271 lb 2 oz (123 kg)      Physical Exam Vitals reviewed.  Constitutional:      Appearance: He is well-developed. He is obese.  HENT:     Head: Normocephalic.      Right Ear: Hearing normal.     Left Ear: Hearing normal.     Nose: Nose normal.  Neck:     Thyroid : No thyroid  mass or thyromegaly.     Vascular: No carotid bruit.     Trachea: Trachea normal.  Cardiovascular:     Rate and Rhythm: Normal rate and regular rhythm.     Pulses: Normal pulses.     Heart sounds: Heart sounds not distant. No murmur heard.    No friction rub. No gallop.     Comments: No peripheral edema Pulmonary:     Effort: Pulmonary effort is normal. No respiratory distress.     Breath sounds: Normal breath sounds.  Skin:    General: Skin is warm and dry.     Findings: No rash.  Psychiatric:        Speech: Speech normal.        Behavior: Behavior normal.        Thought Content: Thought content normal.            Results for orders placed or performed in visit on 08/10/23  CBC with Differential/Platelet   Collection Time: 08/10/23  4:04 PM  Result Value Ref Range   WBC 6.9 4.0 - 10.5 K/uL   RBC 4.58 4.22 - 5.81 Mil/uL   Hemoglobin 14.8 13.0 - 17.0 g/dL   HCT 54.9 60.9 - 47.9 %   MCV 98.4 78.0 - 100.0 fl   MCHC 33.0 30.0 - 36.0 g/dL   RDW 85.1 88.4 - 84.4 %   Platelets 151.0 150.0 - 400.0 K/uL   Neutrophils Relative % 57.1 43.0 - 77.0 %   Lymphocytes Relative 29.6 12.0 - 46.0 %   Monocytes Relative 9.5 3.0 - 12.0 %   Eosinophils Relative 3.3 0.0 - 5.0 %   Basophils Relative 0.5 0.0 - 3.0 %   Neutro Abs 4.0 1.4 - 7.7 K/uL   Lymphs Abs 2.0 0.7 - 4.0 K/uL   Monocytes Absolute 0.7 0.1 - 1.0 K/uL   Eosinophils Absolute 0.2 0.0 - 0.7 K/uL   Basophils Absolute 0.0 0.0 - 0.1 K/uL  Comprehensive metabolic panel with GFR   Collection Time: 08/10/23  4:04 PM  Result Value Ref Range   Sodium 137 135 - 145 mEq/L   Potassium 4.1 3.5 - 5.1 mEq/L   Chloride 101 96 - 112 mEq/L   CO2 24 19 - 32 mEq/L   Glucose, Bld 124 (H) 70 - 99 mg/dL   BUN 19 6 - 23 mg/dL   Creatinine, Ser 9.12 0.40 -  1.50 mg/dL   Total Bilirubin 0.4 0.2 - 1.2 mg/dL   Alkaline Phosphatase 56  39 - 117 U/L   AST 22 0 - 37 U/L   ALT 26 0 - 53 U/L   Total Protein 7.1 6.0 - 8.3 g/dL   Albumin 4.4 3.5 - 5.2 g/dL   GFR 05.52 >39.99 mL/min   Calcium  9.3 8.4 - 10.5 mg/dL  Sedimentation rate   Collection Time: 08/10/23  4:04 PM  Result Value Ref Range   Sed Rate 8 0 - 20 mm/hr  Methylmalonic Acid, Serum   Collection Time: 08/10/23  4:04 PM  Result Value Ref Range   Methylmalonic Acid, Quant 135 55 - 335 nmol/L  Vitamin B12   Collection Time: 08/10/23  4:04 PM  Result Value Ref Range   Vitamin B-12 387 211 - 911 pg/mL    Assessment and Plan  Rash Assessment & Plan:  ACute, recurrent Rash on bilateral legs appears most consistent with contact or allergic dermatitis but is in the distribution where he has likely had venous insufficiency and swelling given he has 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 Will treat with topical triamcinolone  0.5% twice daily x 2 weeks.  Return and ER precautions provided.   Cellulitis of left index finger Assessment & Plan: Acute, likely cellulitis at site of wound in a diabetic. No history of MRSA. Will treat with Keflex 500 mg 3 times daily x 7 days.  Return and ER precautions reviewed in detail.  Patient will call if redness spreading, fever on antibiotics or antibiotic intolerance.   Wound, open, finger, initial encounter Assessment & Plan: Acute, will have to heal by secondary intention. Encouraged patient to continue washing hand with warm soapy water and applying Neosporin. Recommended stopping twice daily peroxide as this could slow healing.   Other orders -     Cephalexin; Take 1 capsule (500 mg total) by mouth 3 (three) times daily.  Dispense: 21 capsule; Refill: 0 -     Triamcinolone  Acetonide; Apply 1 Application topically 2 (two) times daily.  Dispense: 30 g; Refill: 1    No follow-ups on file.   Greig Ring, MD

## 2024-01-22 NOTE — Assessment & Plan Note (Signed)
 Acute, will have to heal by secondary intention. Encouraged patient to continue washing hand with warm soapy water and applying Neosporin. Recommended stopping twice daily peroxide as this could slow healing.

## 2024-01-22 NOTE — Assessment & Plan Note (Signed)
 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 Will treat with topical triamcinolone  0.5% twice daily x 2 weeks.  Return and ER precautions provided.

## 2024-01-26 ENCOUNTER — Other Ambulatory Visit: Payer: Self-pay

## 2024-01-26 DIAGNOSIS — E1169 Type 2 diabetes mellitus with other specified complication: Secondary | ICD-10-CM

## 2024-01-26 NOTE — Telephone Encounter (Signed)
 Received refill request for patient. Please call to set up CPE. Has not been seen by PCP in over a year. Will need lab appointment a few days before office visit. Let me know when scheduled and we send refill for review.

## 2024-01-26 NOTE — Telephone Encounter (Signed)
 I spoked with patient and he has been scheduled

## 2024-02-01 ENCOUNTER — Other Ambulatory Visit: Payer: Self-pay

## 2024-02-01 DIAGNOSIS — I152 Hypertension secondary to endocrine disorders: Secondary | ICD-10-CM

## 2024-02-01 MED ORDER — OLMESARTAN MEDOXOMIL 40 MG PO TABS: 40.0000 mg | ORAL_TABLET | Freq: Every day | ORAL | 0 refills | Status: DC

## 2024-02-01 MED ORDER — AMLODIPINE BESYLATE 5 MG PO TABS: 5.0000 mg | ORAL_TABLET | Freq: Every day | ORAL | 0 refills | Status: DC

## 2024-02-03 MED ORDER — ATORVASTATIN CALCIUM 40 MG PO TABS: 40.0000 mg | ORAL_TABLET | Freq: Every day | ORAL | 0 refills | Status: DC

## 2024-02-23 ENCOUNTER — Other Ambulatory Visit: Payer: Self-pay

## 2024-02-23 DIAGNOSIS — E1169 Type 2 diabetes mellitus with other specified complication: Secondary | ICD-10-CM

## 2024-02-23 DIAGNOSIS — E1159 Type 2 diabetes mellitus with other circulatory complications: Secondary | ICD-10-CM

## 2024-02-23 MED ORDER — ATORVASTATIN CALCIUM 40 MG PO TABS: 40.0000 mg | ORAL_TABLET | Freq: Every day | ORAL | 0 refills | Status: AC

## 2024-02-23 MED ORDER — EMPAGLIFLOZIN 25 MG PO TABS: 25.0000 mg | ORAL_TABLET | Freq: Every day | ORAL | 11 refills | Status: DC

## 2024-02-23 MED ORDER — AMLODIPINE BESYLATE 5 MG PO TABS: 5.0000 mg | ORAL_TABLET | Freq: Every day | ORAL | 0 refills | Status: AC

## 2024-02-23 MED ORDER — OLMESARTAN MEDOXOMIL 40 MG PO TABS: 40.0000 mg | ORAL_TABLET | Freq: Every day | ORAL | 0 refills | Status: AC

## 2024-02-29 ENCOUNTER — Other Ambulatory Visit: Payer: Self-pay | Admitting: Family Medicine

## 2024-02-29 DIAGNOSIS — E538 Deficiency of other specified B group vitamins: Secondary | ICD-10-CM

## 2024-02-29 DIAGNOSIS — M353 Polymyalgia rheumatica: Secondary | ICD-10-CM

## 2024-02-29 DIAGNOSIS — E1140 Type 2 diabetes mellitus with diabetic neuropathy, unspecified: Secondary | ICD-10-CM

## 2024-02-29 DIAGNOSIS — E1169 Type 2 diabetes mellitus with other specified complication: Secondary | ICD-10-CM

## 2024-02-29 DIAGNOSIS — M1A0710 Idiopathic chronic gout, right ankle and foot, without tophus (tophi): Secondary | ICD-10-CM

## 2024-03-04 ENCOUNTER — Other Ambulatory Visit

## 2024-03-04 DIAGNOSIS — E1140 Type 2 diabetes mellitus with diabetic neuropathy, unspecified: Secondary | ICD-10-CM

## 2024-03-04 DIAGNOSIS — E1169 Type 2 diabetes mellitus with other specified complication: Secondary | ICD-10-CM

## 2024-03-04 DIAGNOSIS — E538 Deficiency of other specified B group vitamins: Secondary | ICD-10-CM | POA: Diagnosis not present

## 2024-03-04 DIAGNOSIS — M353 Polymyalgia rheumatica: Secondary | ICD-10-CM | POA: Diagnosis not present

## 2024-03-04 DIAGNOSIS — M1A0710 Idiopathic chronic gout, right ankle and foot, without tophus (tophi): Secondary | ICD-10-CM

## 2024-03-04 DIAGNOSIS — E785 Hyperlipidemia, unspecified: Secondary | ICD-10-CM | POA: Diagnosis not present

## 2024-03-04 LAB — CBC WITH DIFFERENTIAL/PLATELET
Basophils Absolute: 0 K/uL (ref 0.0–0.1)
Basophils Relative: 0.8 % (ref 0.0–3.0)
Eosinophils Absolute: 0.2 K/uL (ref 0.0–0.7)
Eosinophils Relative: 3.7 % (ref 0.0–5.0)
HCT: 47.3 % (ref 39.0–52.0)
Hemoglobin: 15.7 g/dL (ref 13.0–17.0)
Lymphocytes Relative: 36.7 % (ref 12.0–46.0)
Lymphs Abs: 1.8 K/uL (ref 0.7–4.0)
MCHC: 33.1 g/dL (ref 30.0–36.0)
MCV: 99.3 fl (ref 78.0–100.0)
Monocytes Absolute: 0.5 K/uL (ref 0.1–1.0)
Monocytes Relative: 11.4 % (ref 3.0–12.0)
Neutro Abs: 2.3 K/uL (ref 1.4–7.7)
Neutrophils Relative %: 47.4 % (ref 43.0–77.0)
Platelets: 154 K/uL (ref 150.0–400.0)
RBC: 4.77 Mil/uL (ref 4.22–5.81)
RDW: 13.6 % (ref 11.5–15.5)
WBC: 4.8 K/uL (ref 4.0–10.5)

## 2024-03-04 LAB — C-REACTIVE PROTEIN: CRP: <0.5 mg/dL (ref 0.5–20.0)

## 2024-03-04 LAB — COMPREHENSIVE METABOLIC PANEL WITH GFR
ALT: 26 U/L (ref 0–53)
AST: 22 U/L (ref 0–37)
Albumin: 4.4 g/dL (ref 3.5–5.2)
Alkaline Phosphatase: 64 U/L (ref 39–117)
BUN: 14 mg/dL (ref 6–23)
CO2: 28 meq/L (ref 19–32)
Calcium: 9.3 mg/dL (ref 8.4–10.5)
Chloride: 101 meq/L (ref 96–112)
Creatinine, Ser: 0.78 mg/dL (ref 0.40–1.50)
GFR: 97.25 mL/min (ref >60.00)
Glucose, Bld: 199 mg/dL — ABNORMAL HIGH (ref 70–99)
Potassium: 4.7 meq/L (ref 3.5–5.1)
Sodium: 138 meq/L (ref 135–145)
Total Bilirubin: 0.6 mg/dL (ref 0.2–1.2)
Total Protein: 7 g/dL (ref 6.0–8.3)

## 2024-03-04 LAB — URIC ACID: Uric Acid, Serum: 6 mg/dL (ref 4.0–7.8)

## 2024-03-04 LAB — MICROALBUMIN / CREATININE URINE RATIO
Creatinine,U: 48.8 mg/dL
Microalb Creat Ratio: UNDETERMINED mg/g (ref 0.0–30.0)
Microalb, Ur: <0.7 mg/dL

## 2024-03-04 LAB — LIPID PANEL
Cholesterol: 131 mg/dL (ref 0–200)
HDL: 32.8 mg/dL — ABNORMAL LOW (ref >39.00)
LDL Cholesterol: 26 mg/dL (ref 0–99)
NonHDL: 98.42
Total CHOL/HDL Ratio: 4
Triglycerides: 360 mg/dL — ABNORMAL HIGH (ref 0.0–149.0)
VLDL: 72 mg/dL — ABNORMAL HIGH (ref 0.0–40.0)

## 2024-03-04 LAB — HEMOGLOBIN A1C: Hgb A1c MFr Bld: 9.9 % — ABNORMAL HIGH (ref 4.6–6.5)

## 2024-03-04 LAB — VITAMIN B12: Vitamin B-12: 856 pg/mL (ref 211–911)

## 2024-03-04 LAB — SEDIMENTATION RATE: Sed Rate: 7 mm/h (ref 0–20)

## 2024-03-05 ENCOUNTER — Ambulatory Visit: Payer: Self-pay | Admitting: Family Medicine

## 2024-03-11 ENCOUNTER — Ambulatory Visit: Admitting: Family Medicine

## 2024-03-11 ENCOUNTER — Encounter: Payer: Self-pay | Admitting: Family Medicine

## 2024-03-11 VITALS — BP 110/78 | HR 73 | Temp 98.6°F | Ht 68.25 in | Wt 265.4 lb

## 2024-03-11 DIAGNOSIS — E538 Deficiency of other specified B group vitamins: Secondary | ICD-10-CM | POA: Diagnosis not present

## 2024-03-11 DIAGNOSIS — E1140 Type 2 diabetes mellitus with diabetic neuropathy, unspecified: Secondary | ICD-10-CM | POA: Diagnosis not present

## 2024-03-11 DIAGNOSIS — E1159 Type 2 diabetes mellitus with other circulatory complications: Secondary | ICD-10-CM | POA: Diagnosis not present

## 2024-03-11 DIAGNOSIS — Z0000 Encounter for general adult medical examination without abnormal findings: Secondary | ICD-10-CM | POA: Diagnosis not present

## 2024-03-11 DIAGNOSIS — I152 Hypertension secondary to endocrine disorders: Secondary | ICD-10-CM

## 2024-03-11 DIAGNOSIS — M1A0710 Idiopathic chronic gout, right ankle and foot, without tophus (tophi): Secondary | ICD-10-CM | POA: Diagnosis not present

## 2024-03-11 DIAGNOSIS — I2510 Atherosclerotic heart disease of native coronary artery without angina pectoris: Secondary | ICD-10-CM

## 2024-03-11 DIAGNOSIS — M353 Polymyalgia rheumatica: Secondary | ICD-10-CM

## 2024-03-11 DIAGNOSIS — E785 Hyperlipidemia, unspecified: Secondary | ICD-10-CM | POA: Diagnosis not present

## 2024-03-11 DIAGNOSIS — E1169 Type 2 diabetes mellitus with other specified complication: Secondary | ICD-10-CM

## 2024-03-11 DIAGNOSIS — H6523 Chronic serous otitis media, bilateral: Secondary | ICD-10-CM

## 2024-03-11 DIAGNOSIS — Z0001 Encounter for general adult medical examination with abnormal findings: Secondary | ICD-10-CM

## 2024-03-11 DIAGNOSIS — Z23 Encounter for immunization: Secondary | ICD-10-CM | POA: Diagnosis not present

## 2024-03-11 DIAGNOSIS — M0600 Rheumatoid arthritis without rheumatoid factor, unspecified site: Secondary | ICD-10-CM | POA: Diagnosis not present

## 2024-03-11 DIAGNOSIS — Q319 Congenital malformation of larynx, unspecified: Secondary | ICD-10-CM

## 2024-03-11 MED ORDER — OLMESARTAN MEDOXOMIL 40 MG PO TABS: 40.0000 mg | ORAL_TABLET | Freq: Every day | ORAL | 3 refills | Status: AC

## 2024-03-11 MED ORDER — OMEPRAZOLE 40 MG PO CPDR: 40.0000 mg | DELAYED_RELEASE_CAPSULE | Freq: Every morning | ORAL | 3 refills | Status: AC

## 2024-03-11 MED ORDER — ATORVASTATIN CALCIUM 40 MG PO TABS: 40.0000 mg | ORAL_TABLET | Freq: Every day | ORAL | 3 refills | Status: AC

## 2024-03-11 MED ORDER — GLIMEPIRIDE 2 MG PO TABS: 2.0000 mg | ORAL_TABLET | Freq: Every day | ORAL | 3 refills | Status: AC

## 2024-03-11 MED ORDER — EMPAGLIFLOZIN 25 MG PO TABS: 25.0000 mg | ORAL_TABLET | Freq: Every day | ORAL | 11 refills | Status: AC

## 2024-03-11 MED ORDER — AMLODIPINE BESYLATE 5 MG PO TABS: 5.0000 mg | ORAL_TABLET | Freq: Every day | ORAL | 3 refills | Status: AC

## 2024-03-11 NOTE — Assessment & Plan Note (Signed)
 Preventative protocols reviewed and updated unless pt declined. Discussed healthy diet and lifestyle.

## 2024-03-11 NOTE — Progress Notes (Unsigned)
 " Ph: [phone] Fax: [phone]   Patient ID: Joel Bennett, male    DOB: 1965/05/17, 58 y.o.   MRN: [id number]  This visit was conducted in person.  BP 110/78 (BP Location: Left Arm, Cuff Size: Normal)   Pulse 73   Temp 98.6 F (37 C) (Oral)   Ht 5' 8.25 (1.734 m)   Wt 265 lb 6.4 oz (120.4 kg)   SpO2 96%   BMI 40.06 kg/m    CC: CPE Subjective:   HPI: Joel Bennett is a 57 y.o. male presenting on 03/11/2024 for Annual Exam (DM foot exam due//Pt states her woke up with both ears clogged this morning, states they pop and then go right back)   Last seen 12/2022.  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 Hoarseness resolved. Seeing regularly for close monitoring.   Ears clogged off and on s/p myringotomy with bilateral tube placement 07/2022 ([facility]). Started on OTC Xlear.  Saw [facility] yesterday - pending home sleep study through them.   H/o seronegative RA saw rheum Jolynn Pereyra PA) 04/2019 - previously on MTX weekly without benefit so stopped. No further hand redness or warmth but hands stay painful/swollen. Has received prednisone  course as well as celebrex  with benefit.  Celebrex  hasn't really helped - desires trial off this.    DM - managed on amaryl  2mg  daily and Jardiance  25mg  daily. Metformin  XR intolerance, trouble tolerating ozempic  as well, and rybelsus  unaffordable. Stopped actos  15mg  due to weight gain. Ran out of meds for a few weeks prior to refills available.    HTN - continues amlodipine  5mg  daily, benicar  40mg  daily. Not checking at home. No low BP symptoms.   No recent recurrent chest discomfort   Preventative: COLONOSCOPY 11/2018 - TA, rpt 7 yrs [facility])  Prostate cancer screening - yearly PSA, declines rectal exam. Lung cancer screening - not eligible  Flu shot - declines  Td - ~2009, rpt Tdap  Pneumococcal vaccine - declines  Shingrix - declines -  never had chicken pox Seat belt use discussed  Sunscreen use discussed. Denies changing moles on skin.  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 Interim medical history since our last visit reviewed. Allergies and medications reviewed and updated. Outpatient Medications Prior to Visit  Medication Sig Dispense Refill   aspirin  EC 81 MG tablet Take 1 tablet (81 mg total) by mouth daily.     glucose blood (FREESTYLE LITE) test strip Use as instructed to check blood sugars once daily 100 each 3   indomethacin  (INDOCIN ) 50 MG capsule Take 1 capsule (50 mg total) by mouth 3 (three) times daily as needed (gout flare). 30 capsule 0   triamcinolone  cream (KENALOG ) 0.5 % Apply 1 Application topically 2 (two) times daily. 30 g 1   vitamin B-12 (CYANOCOBALAMIN ) 1000 MCG tablet Take 1 tablet (1,000 mcg total) by mouth daily.     amLODipine  (NORVASC ) 5 MG tablet Take 1 tablet (5 mg  total) by mouth daily. 30 tablet 0   atorvastatin  (LIPITOR) 40 MG tablet Take 1 tablet (40 mg total) by mouth daily. 30 tablet 0   celecoxib  (CELEBREX ) 200 MG capsule Take 1 capsule (200 mg total) by mouth daily. 30 capsule 6   empagliflozin  (JARDIANCE ) 25 MG TABS tablet Take 1 tablet (25 mg total) by mouth daily before breakfast. 30 tablet 11   glimepiride  (AMARYL ) 2 MG tablet Take 1 tablet (2 mg total) by mouth daily with breakfast. 90 tablet 4   olmesartan  (BENICAR ) 40 MG tablet Take 1 tablet (40 mg total) by mouth daily. 30 tablet 0   omeprazole  (PRILOSEC) 20 MG capsule Take 20 mg by mouth  every morning.     cephALEXin  (KEFLEX ) 500 MG capsule Take 1 capsule (500 mg total) by mouth 3 (three) times daily. (Patient not taking: Reported on 03/11/2024) 21 capsule 0   No facility-administered medications prior to visit.     Per HPI unless specifically indicated in ROS section below Review of Systems  Constitutional:  Negative for activity change, appetite change, chills, fatigue, fever and unexpected weight change.  HENT:  Negative for hearing loss.   Eyes:  Negative for visual disturbance.  Respiratory:  Negative for cough, chest tightness, shortness of breath and wheezing.   Cardiovascular:  Negative for chest pain, palpitations and leg swelling.  Gastrointestinal:  Negative for abdominal distention, abdominal pain, blood in stool, constipation, diarrhea, nausea and vomiting.  Genitourinary:  Negative for difficulty urinating and hematuria.  Musculoskeletal:  Negative for arthralgias, myalgias and neck pain.  Skin:  Negative for rash.  Neurological:  Negative for dizziness, seizures, syncope and headaches.  Hematological:  Negative for adenopathy. Bruises/bleeds easily.  Psychiatric/Behavioral:  Negative for dysphoric mood. The patient is not nervous/anxious.     Objective:  BP 110/78 (BP Location: Left Arm, Cuff Size: Normal)   Pulse 73   Temp 98.6 F (37 C) (Oral)   Ht 5' 8.25 (1.734 m)   Wt 265 lb 6.4 oz (120.4 kg)   SpO2 96%   BMI 40.06 kg/m   Wt Readings from Last 3 Encounters:  03/11/24 265 lb 6.4 oz (120.4 kg)  01/22/24 272 lb (123.4 kg)  08/10/23 271 lb 6 oz (123.1 kg)      Physical Exam Vitals and nursing note reviewed.  Constitutional:      General: He is not in acute distress.    Appearance: Normal appearance. He is well-developed. He is not ill-appearing.  HENT:     Head: Normocephalic and atraumatic.     Right Ear: Hearing, ear canal and external ear normal. A PE tube is present.     Left Ear: Hearing, ear canal and external ear normal. A PE tube  is present.     Mouth/Throat:     Mouth: Mucous membranes are moist.     Pharynx: Oropharynx is clear. No oropharyngeal exudate or posterior oropharyngeal erythema.  Eyes:     General: No scleral icterus.    Extraocular Movements: Extraocular movements intact.     Conjunctiva/sclera: Conjunctivae normal.     Pupils: Pupils are equal, round, and reactive to light.  Neck:     Thyroid : No thyroid  mass or thyromegaly.  Cardiovascular:     Rate and Rhythm: Normal rate and regular rhythm.     Pulses: Normal pulses.          Radial pulses are 2+ on the right side and 2+ on the left side.     Heart  sounds: Normal heart sounds. No murmur heard. Pulmonary:     Effort: Pulmonary effort is normal. No respiratory distress.     Breath sounds: Normal breath sounds. No wheezing, rhonchi or rales.  Abdominal:     General: Bowel sounds are normal. There is no distension.     Palpations: Abdomen is soft. There is no mass.     Tenderness: There is no abdominal tenderness. There is no guarding or rebound.     Hernia: No hernia is present.  Musculoskeletal:        General: Normal range of motion.     Cervical back: Normal range of motion and neck supple.     Right lower leg: No edema.     Left lower leg: No edema.  Lymphadenopathy:     Cervical: No cervical adenopathy.  Skin:    General: Skin is warm and dry.     Findings: No rash.  Neurological:     General: No focal deficit present.     Mental Status: He is alert and oriented to person, place, and time.  Psychiatric:        Mood and Affect: Mood normal.        Behavior: Behavior normal.        Thought Content: Thought content normal.        Judgment: Judgment normal.       Results for orders placed or performed in visit on 03/04/24  C-reactive protein   Collection Time: 03/04/24  7:36 AM  Result Value Ref Range   CRP <0.5 0.5 - 20.0 mg/dL  Sedimentation rate   Collection Time: 03/04/24  7:36 AM  Result Value Ref Range   Sed Rate 7 0  - 20 mm/hr  CBC with Differential/Platelet   Collection Time: 03/04/24  7:36 AM  Result Value Ref Range   WBC 4.8 4.0 - 10.5 K/uL   RBC 4.77 4.22 - 5.81 Mil/uL   Hemoglobin 15.7 13.0 - 17.0 g/dL   HCT 52.6 60.9 - 47.9 %   MCV 99.3 78.0 - 100.0 fl   MCHC 33.1 30.0 - 36.0 g/dL   RDW 86.3 88.4 - 84.4 %   Platelets 154.0 150.0 - 400.0 K/uL   Neutrophils Relative % 47.4 43.0 - 77.0 %   Lymphocytes Relative 36.7 12.0 - 46.0 %   Monocytes Relative 11.4 3.0 - 12.0 %   Eosinophils Relative 3.7 0.0 - 5.0 %   Basophils Relative 0.8 0.0 - 3.0 %   Neutro Abs 2.3 1.4 - 7.7 K/uL   Lymphs Abs 1.8 0.7 - 4.0 K/uL   Monocytes Absolute 0.5 0.1 - 1.0 K/uL   Eosinophils Absolute 0.2 0.0 - 0.7 K/uL   Basophils Absolute 0.0 0.0 - 0.1 K/uL  Uric acid   Collection Time: 03/04/24  7:36 AM  Result Value Ref Range   Uric Acid, Serum 6.0 4.0 - 7.8 mg/dL  Vitamin B12   Collection Time: 03/04/24  7:36 AM  Result Value Ref Range   Vitamin B-12 856 211 - 911 pg/mL  Comprehensive metabolic panel with GFR   Collection Time: 03/04/24  7:36 AM  Result Value Ref Range   Sodium 138 135 - 145 mEq/L   Potassium 4.7 3.5 - 5.1 mEq/L   Chloride 101 96 - 112 mEq/L   CO2 28 19 - 32 mEq/L   Glucose, Bld 199 (H) 70 - 99 mg/dL   BUN 14 6 - 23 mg/dL   Creatinine, Ser 9.21 0.40 - 1.50 mg/dL  Total Bilirubin 0.6 0.2 - 1.2 mg/dL   Alkaline Phosphatase 64 39 - 117 U/L   AST 22 0 - 37 U/L   ALT 26 0 - 53 U/L   Total Protein 7.0 6.0 - 8.3 g/dL   Albumin 4.4 3.5 - 5.2 g/dL   GFR 02.74 >39.99 mL/min   Calcium  9.3 8.4 - 10.5 mg/dL  Lipid panel   Collection Time: 03/04/24  7:36 AM  Result Value Ref Range   Cholesterol 131 0 - 200 mg/dL   Triglycerides 639.9 (H) 0.0 - 149.0 mg/dL   HDL 67.19 (L) >60.99 mg/dL   VLDL 27.9 (H) 0.0 - 59.9 mg/dL   LDL Cholesterol 26 0 - 99 mg/dL   Total CHOL/HDL Ratio 4    NonHDL 98.42   Microalbumin / creatinine urine ratio   Collection Time: 03/04/24  7:36 AM  Result Value Ref Range    Microalb, Ur <0.7 mg/dL   Creatinine,U 51.1 mg/dL   Microalb Creat Ratio Unable to calculate 0.0 - 30.0 mg/g  Hemoglobin A1c   Collection Time: 03/04/24  7:36 AM  Result Value Ref Range   Hgb A1c MFr Bld 9.9 (H) 4.6 - 6.5 %    Assessment & Plan:   Problem List Items Addressed This Visit     Health maintenance examination - Primary (Chronic)   Preventative protocols reviewed and updated unless pt declined. Discussed healthy diet and lifestyle.       Gout   No recent gout flares, stable period off gout lowering medication.       Hypertension associated with diabetes (HCC)   Chronic, great control on current regimen without hypotensive symptoms      Relevant Medications   amLODipine  (NORVASC ) 5 MG tablet   atorvastatin  (LIPITOR) 40 MG tablet   empagliflozin  (JARDIANCE ) 25 MG TABS tablet   glimepiride  (AMARYL ) 2 MG tablet   olmesartan  (BENICAR ) 40 MG tablet   Type 2 diabetes mellitus with diabetic neuropathy, unspecified (HCC)   Chronic, deteriorated despite amaryl  and jardiance  25mg  daily.  He notes he did run out of jardiance  for several weeks and refill was not approved until recently.  Will reassess control in 3 months back on regular regimen.  Would consider insulin addition of persistently elevated       Relevant Medications   atorvastatin  (LIPITOR) 40 MG tablet   empagliflozin  (JARDIANCE ) 25 MG TABS tablet   glimepiride  (AMARYL ) 2 MG tablet   olmesartan  (BENICAR ) 40 MG tablet   Polymyalgia rheumatica   H/o this without recurrence, inflammatory markers remain normal      Dyslipidemia associated with type 2 diabetes mellitus (HCC)   Chronic, on atorvastatin  40mg  daily with good LDL control however triglyclerides are high presumed due to hyperglycemia. Encouraged renewed efforts towards glycemic control. Reassess at 3 mo DM f/u visit.  The 10-year ASCVD risk score (Arnett DK, et al., 2019) is: 13.3%   Values used to calculate the score:     Age: 15 years      Clinically relevant sex: Male     Is Non-Hispanic African American: No     Diabetic: Yes     Tobacco smoker: No     Systolic Blood Pressure: 110 mmHg     Is BP treated: Yes     HDL Cholesterol: 32.8 mg/dL     Total Cholesterol: 131 mg/dL       Relevant Medications   atorvastatin  (LIPITOR) 40 MG tablet   empagliflozin  (JARDIANCE ) 25 MG TABS tablet   glimepiride  (  AMARYL ) 2 MG tablet   olmesartan  (BENICAR ) 40 MG tablet   CAD (coronary artery disease), native coronary artery   Elevated coronary calcium  score (98%) 2020 - saw cardiology, thought microvascular disease.  Continue aspirin , statin, work towards tight glycemic control.  No recent symptoms of chest discomfort      Relevant Medications   amLODipine  (NORVASC ) 5 MG tablet   atorvastatin  (LIPITOR) 40 MG tablet   olmesartan  (BENICAR ) 40 MG tablet   Seronegative rheumatoid arthritis (HCC)   Stable period off DMARD.  He was taking celebrex  200mg  daily - but without significant improvement noted. Desires to stay off this medication  which is reasonable.       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 This presented with persistent hoarseness.  No recurrent hoarseness.  Non smoker, does do snuff         Meds ordered this encounter  Medications   amLODipine  (NORVASC ) 5 MG tablet    Sig: Take 1 tablet (5 mg total) by mouth daily.    Dispense:  90 tablet    Refill:  3   atorvastatin  (LIPITOR) 40 MG tablet    Sig: Take 1 tablet (40 mg total) by mouth daily.    Dispense:  90 tablet    Refill:  3   empagliflozin  (JARDIANCE ) 25 MG TABS tablet    Sig: Take 1 tablet (25 mg total) by mouth daily before breakfast.    Dispense:  30  tablet    Refill:  11   glimepiride  (AMARYL ) 2 MG tablet    Sig: Take 1 tablet (2 mg total) by mouth daily with breakfast.    Dispense:  90 tablet    Refill:  3   olmesartan  (BENICAR ) 40 MG tablet    Sig: Take 1 tablet (40 mg total) by mouth daily.    Dispense:  90 tablet    Refill:  3   omeprazole  (PRILOSEC) 40 MG capsule    Sig: Take 1 capsule (40 mg total) by mouth every morning.    Dispense:  90 capsule    Refill:  3    Orders Placed This Encounter  Procedures   Tdap vaccine greater than or equal to 7yo IM    Patient Instructions  Tdap today  Ok to stay off celebrex .  Continue current medicines refilled today  Good to see you today Return as needed or in 3 months for diabetes follow up visit.   Follow up plan: Return in about 3 months (around 06/09/2024) for follow up visit.  Anton Blas, MD   "

## 2024-03-11 NOTE — Patient Instructions (Addendum)
 Tdap today  Ok to stay off celebrex .  Continue current medicines refilled today  Good to see you today Return as needed or in 3 months for diabetes follow up visit.

## 2024-03-12 NOTE — Assessment & Plan Note (Signed)
 Followed by ENT s/p tympanostomy tubes bilaterally.

## 2024-03-12 NOTE — Assessment & Plan Note (Signed)
 H/o this without recurrence, inflammatory markers remain normal

## 2024-03-12 NOTE — Assessment & Plan Note (Addendum)
 Elevated coronary calcium  score (98%) 2020 - saw cardiology, thought microvascular disease.  Continue aspirin , statin, work towards tight glycemic control.  No recent symptoms of chest discomfort

## 2024-03-12 NOTE — Assessment & Plan Note (Signed)
 Chronic, on atorvastatin  40mg  daily with good LDL control however triglyclerides are high presumed due to hyperglycemia. Encouraged renewed efforts towards glycemic control. Reassess at 3 mo DM f/u visit.  The 10-year ASCVD risk score (Arnett DK, et al., 2019) is: 13.3%   Values used to calculate the score:     Age: 58 years     Clinically relevant sex: Male     Is Non-Hispanic African American: No     Diabetic: Yes     Tobacco smoker: No     Systolic Blood Pressure: 110 mmHg     Is BP treated: Yes     HDL Cholesterol: 32.8 mg/dL     Total Cholesterol: 131 mg/dL

## 2024-03-12 NOTE — Assessment & Plan Note (Signed)
 Chronic, great control on current regimen without hypotensive symptoms

## 2024-03-12 NOTE — Assessment & Plan Note (Signed)
Continue oral b12 replacement.  

## 2024-03-12 NOTE — Assessment & Plan Note (Signed)
 Chronic, deteriorated despite amaryl  and jardiance  25mg  daily.  He notes he did run out of jardiance  for several weeks and refill was not approved until recently.  Will reassess control in 3 months back on regular regimen.  Would consider insulin addition of persistently elevated

## 2024-03-12 NOTE — Assessment & Plan Note (Signed)
 No recent gout flares, stable period off gout lowering medication.

## 2024-03-12 NOTE — Assessment & Plan Note (Signed)
 Stable period off DMARD.  He was taking celebrex  200mg  daily - but without significant improvement noted. Desires to stay off this medication  which is reasonable.

## 2024-03-12 NOTE — Assessment & Plan Note (Addendum)
 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 This presented with persistent hoarseness.  No recurrent hoarseness.  Non smoker, does do snuff

## 2024-06-10 ENCOUNTER — Ambulatory Visit: Admitting: Family Medicine
